# Patient Record
Sex: Male | Born: 1935 | Race: White | Hispanic: No | Marital: Married | Smoking: Former smoker
Health system: Southern US, Community
[De-identification: ages and names within clinical notes are randomized; demographics above are authoritative.]

## PROBLEM LIST (undated history)

## (undated) DIAGNOSIS — M545 Low back pain, unspecified: Secondary | ICD-10-CM

## (undated) DIAGNOSIS — M1990 Unspecified osteoarthritis, unspecified site: Secondary | ICD-10-CM

## (undated) DIAGNOSIS — E785 Hyperlipidemia, unspecified: Secondary | ICD-10-CM

## (undated) DIAGNOSIS — N200 Calculus of kidney: Secondary | ICD-10-CM

## (undated) DIAGNOSIS — G8929 Other chronic pain: Secondary | ICD-10-CM

## (undated) DIAGNOSIS — Z8719 Personal history of other diseases of the digestive system: Secondary | ICD-10-CM

## (undated) DIAGNOSIS — I8390 Asymptomatic varicose veins of unspecified lower extremity: Secondary | ICD-10-CM

## (undated) DIAGNOSIS — F0390 Unspecified dementia without behavioral disturbance: Secondary | ICD-10-CM

## (undated) DIAGNOSIS — Z9889 Other specified postprocedural states: Secondary | ICD-10-CM

## (undated) DIAGNOSIS — H269 Unspecified cataract: Secondary | ICD-10-CM

## (undated) DIAGNOSIS — R112 Nausea with vomiting, unspecified: Secondary | ICD-10-CM

## (undated) DIAGNOSIS — M109 Gout, unspecified: Secondary | ICD-10-CM

## (undated) DIAGNOSIS — I10 Essential (primary) hypertension: Secondary | ICD-10-CM

## (undated) DIAGNOSIS — I4891 Unspecified atrial fibrillation: Secondary | ICD-10-CM

## (undated) DIAGNOSIS — K219 Gastro-esophageal reflux disease without esophagitis: Secondary | ICD-10-CM

## (undated) HISTORY — DX: Low back pain, unspecified: M54.50

## (undated) HISTORY — DX: Hyperlipidemia, unspecified: E78.5

## (undated) HISTORY — PX: APPENDECTOMY: SHX54

## (undated) HISTORY — PX: TOE AMPUTATION: SHX809

## (undated) HISTORY — PX: CYSTOSCOPY W/ STONE MANIPULATION: SHX1427

## (undated) HISTORY — PX: EYE SURGERY: SHX253

## (undated) HISTORY — DX: Unspecified cataract: H26.9

## (undated) HISTORY — DX: Asymptomatic varicose veins of unspecified lower extremity: I83.90

## (undated) HISTORY — DX: Calculus of kidney: N20.0

## (undated) HISTORY — DX: Low back pain: M54.5

## (undated) HISTORY — DX: Essential (primary) hypertension: I10

## (undated) HISTORY — PX: TONSILLECTOMY AND ADENOIDECTOMY: SUR1326

## (undated) HISTORY — DX: Unspecified atrial fibrillation: I48.91

## (undated) HISTORY — PX: CHOLECYSTECTOMY: SHX55

## (undated) HISTORY — DX: Gout, unspecified: M10.9

---

## 1999-01-26 HISTORY — PX: CATARACT EXTRACTION W/ INTRAOCULAR LENS IMPLANT: SHX1309

## 1999-04-23 ENCOUNTER — Encounter (INDEPENDENT_AMBULATORY_CARE_PROVIDER_SITE_OTHER): Payer: Self-pay | Admitting: *Deleted

## 1999-04-23 ENCOUNTER — Ambulatory Visit (HOSPITAL_COMMUNITY): Admission: RE | Admit: 1999-04-23 | Discharge: 1999-04-23 | Payer: Self-pay

## 1999-04-23 ENCOUNTER — Inpatient Hospital Stay (HOSPITAL_COMMUNITY): Admission: EM | Admit: 1999-04-23 | Discharge: 1999-04-26 | Payer: Self-pay | Admitting: Emergency Medicine

## 1999-04-24 ENCOUNTER — Encounter: Payer: Self-pay | Admitting: Internal Medicine

## 2002-11-09 ENCOUNTER — Emergency Department (HOSPITAL_COMMUNITY): Admission: EM | Admit: 2002-11-09 | Discharge: 2002-11-09 | Payer: Self-pay | Admitting: Emergency Medicine

## 2004-07-11 ENCOUNTER — Ambulatory Visit: Payer: Self-pay | Admitting: Gastroenterology

## 2004-07-19 ENCOUNTER — Ambulatory Visit: Payer: Self-pay | Admitting: Gastroenterology

## 2004-10-04 ENCOUNTER — Ambulatory Visit (HOSPITAL_COMMUNITY): Admission: RE | Admit: 2004-10-04 | Discharge: 2004-10-04 | Payer: Self-pay | Admitting: Family Medicine

## 2006-10-06 ENCOUNTER — Ambulatory Visit: Payer: Self-pay | Admitting: Internal Medicine

## 2006-10-22 ENCOUNTER — Ambulatory Visit: Payer: Self-pay | Admitting: Internal Medicine

## 2010-06-17 ENCOUNTER — Encounter: Payer: Self-pay | Admitting: Family Medicine

## 2010-07-02 LAB — HEMOCCULT GUIAC POC 1CARD (OFFICE)

## 2010-08-25 ENCOUNTER — Encounter: Payer: Self-pay | Admitting: Family Medicine

## 2010-08-25 DIAGNOSIS — I8390 Asymptomatic varicose veins of unspecified lower extremity: Secondary | ICD-10-CM

## 2010-08-25 DIAGNOSIS — K449 Diaphragmatic hernia without obstruction or gangrene: Secondary | ICD-10-CM

## 2010-08-25 DIAGNOSIS — M545 Low back pain, unspecified: Secondary | ICD-10-CM

## 2010-08-25 DIAGNOSIS — N200 Calculus of kidney: Secondary | ICD-10-CM

## 2010-08-25 DIAGNOSIS — I10 Essential (primary) hypertension: Secondary | ICD-10-CM

## 2010-08-25 DIAGNOSIS — M109 Gout, unspecified: Secondary | ICD-10-CM

## 2010-08-25 DIAGNOSIS — E785 Hyperlipidemia, unspecified: Secondary | ICD-10-CM

## 2010-10-12 NOTE — Consult Note (Signed)
NAME:  Mark Wilkinson, Mark Wilkinson                            ACCOUNT NO.:  [id number]   MEDICAL RECORD NO.:  [id number]                   PATIENT TYPE:  EMS   LOCATION:  [facility]                                 FACILITY:  [facility]   PHYSICIAN:  Karol T. Lazarus Salines, M.D.              DATE OF BIRTH:  [birth year]   DATE OF CONSULTATION:  DATE OF DISCHARGE:  11/09/2002                                   CONSULTATION   CHIEF COMPLAINT:  Left epistaxis.   BRIEF HISTORY:  A 77 year old white male who had a trickle of blood last  evening, nothing today, and then this evening at dinner, had a brisk left  anterior nosebleed.  He bled approximately 1/2 cup in the doctor's office up  in [place].  Dr.  Charlesetta Shanks could not see an active bleeding site, and sent him our way for  further consideration.  The patient does have hypertension, but is well  controlled on medications.  He takes one aspirin daily, and one Celebrex  every other day.  He has no known bleeding tendencies.  No recent history of  nasal trauma, upper respiratory infection, sinus infection.  He has not been  blowing his nose excessively.  No trauma to the nose.  No past history of  surgery to the nose.  No history of polyps.   Dr. Charlesetta Shanks placed a gauze pack prior to his transfer.  He is not  actively bleeding at the time of his arrival.  He has never had nosebleed  before.  No other known bleeding tendencies including bruising or bleeding  when he brushes his teeth.   PHYSICAL EXAMINATION:  GENERAL:  This is a pleasant, older, middle-aged  white male, in no distress.  He has bulky gauze packing taped on to his  nose.  Mental status is sharp.  He hears well in conversational speech.  Voice is clear and respirations are unlabored.  He is not actively bleeding.  HEENT:  Head is atraumatic.  Ear canals are clear with aerated drums of  normal configuration.  Oral cavity is clear with healthy membranes, and  remaining teeth are in good repair.   Oropharynx shows no residual blood and  no active bleeding.  Nasal examination shows a clear musocal on the right  side.  On the left side, he has a very subtle granuloma on the anterior  septum, with minimal trauma.  This bleeds in a brisk arteriolar fashion.  No  other lesions are identified.  NECK:  Supple.  Without adenopathy.  NEUROLOGIC:  Cranial nerves II-XII are intact.   IMPRESSION:  Left anterior septal granuloma with epistaxis.   PLAN:  With informed consent, I anesthetized the anterior nose with 4%  topical cocaine solution, 4 cc total.  After several minutes, silver nitrate  cautery was performed thoroughly in the vicinity with good control and good  tolerance.  We will have  the patient hold off on aspirin for five days, and  same with activities. We will have him use  ointment in the nostrils twice daily, and saline spray frequently to keep  things moist so the scab will melt away.  I will check him in one month or  not at all if he is pleased with the results.   I discussed this with the patient and his family.  They understand and  agree.                                               Gloris Manchester. Lazarus Salines, M.D.    KTW/MEDQ  D:  11/09/2002  T:  11/09/2002  Job:  [id number]   cc:   Charlesetta Shanks  [address]  [place] [address]  Fax: [phone]

## 2010-10-12 NOTE — H&P (Signed)
[place]. [facility]  Patient:    Mark Wilkinson                            MRN: [id number] Adm. Date:  [id number] Attending:  Brandy Hale CC:         Osker Mason, M.D., [facility]             Wilhemina Bonito. Eda Keys., M.D. [facility]             Haywood M. Derrell Lolling, M.D. (2 copies)                         History and Physical  CHIEF COMPLAINT:  Abdominal pain, nausea, and jaundice.  HISTORY OF PRESENT ILLNESS:  This is a 77 year old white man who gives a history of insidious onset of epigastric pain, nausea, and vomiting about three weeks ago. He had a low-grade fever at that time but no diarrhea.  The pain and nausea subsided, but he remained anorexic until recently.  About four or five days ago, he developed recurrent epigastric pain, nausea, and retching, and postprandial nausea persisted. He developed fever to 101.6 and chills while at home.  His bowel movements became very light in color, and his urine became very dark in color.  He did not have ny diarrhea.  The pain eased off about 48 hours ago, and he feels fairly good today. He saw Dr. Heide Scales at [facility] last Friday.  He was given a prescription for Carafate.  Blood work was drawn which ultimately revealed a total bilirubin of 7.6 and elevated liver enzymes.  Hemoglobin 15.9 and a white count of 9300.  He had a gallbladder ultrasound at [facility] this morning which shows a thick walled gallbladder containing gallstones and a dilated common bile duct at 12 mm diameter.   He also underwent a hepatobiliary scan this morning at [facility] which shows nonvisualization of the gallbladder and a prominent common bile duct and a slow trickle of contrast into the small bowel. He as sent to the emergency room for my evaluation.  He is admitted for further evaluation and management.  PAST MEDICAL HISTORY:  He has hypertension and  has seen Dr. Bonnee Quin in the ast for treadmill evaluation.  He has gastroesophageal reflux disease without known  complication.  He had appendectomy several years ago, tonsillectomy several years ago.  He has a history of kidney stones, history of mild benign prostatic hypertrophy.  History of gout.  History of arthritis.  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  CURRENT MEDICATIONS: 1. Hydrochlorothiazide 25 mg q.d. 2. Allopurinol 300 mg q.d. 3. Atenolol 100 mg 1/2 tablet b.i.d. 4. Accupril 10 mg b.i.d. 5. Pravachol 20 mg q.d. 6. Celebrex 200 mg q.d. 7. Aspirin 325 mg q.d.  DRUG ALLERGIES:  PENICILLIN.  FAMILY HISTORY:  Mother died at age 83 of "old age."  Father died at age 82. He had a myocardial infarction and a stroke.  Two brothers, one died at age 76 of  myocardial infarction.  The other brother is living but has also had a heart attack.  Two sisters, one has hypertension.  SOCIAL HISTORY:  The patient lives in [place].  He is married and lives with his wife.  They have four children.  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  Tobacco: He quit smoking 40 years ago.  Alcohol use: Never.  REVIEW OF SYSTEMS:  All systems are reviewed and are negative except as described above.  PHYSICAL EXAMINATION:  GENERAL:  A relatively healthy-appearing, somewhat overweight, older white man n no apparent distress.  VITAL SIGNS:  Temperature 97.7, respirations 16, heart rate 61.  HEENT:  Trace scleral icterus.  Extraocular movements intact.  Nose, oropharynx, and external auditory canals without gross lesion.  NECK:  Supple, nontender.  No mass, no bruit, no thyromegaly.  LUNGS:  Clear to auscultation.  No CVA tenderness.  HEART:  Regular rate and rhythm with no murmur.  ABDOMEN:  Slightly obese, soft, and nontender.  Hypoactive bowel sounds.  No mass. A well-healed paramedian scar on the right side of the lower abdomen from previous appendectomy,  no hernia there.  GENITALIA:  No obvious hernia.  Penis, scrotum, and testes feel normal.  EXTREMITIES:  Free range of motion without deformity.  There is no peripheral edema.  The right second toe is surgically absent.  There is excellent radial, femoral, and dorsalis pedis pulses bilaterally.  NEUROLOGIC:  Grossly within normal limits.  ADMISSION DATA:  Total bilirubin 2.7, creatinine 1.8, BUN 45, potassium 3.9. WBC 9400, hemoglobin 15.1.  Amylase 145.  IMPRESSION: 1. Subacute cholecystitis with cholelithiasis. 2. Obstructive jaundice, suspect choledocholithiasis. 3. Prerenal azotemia. 4. Hypertension. 5. Gastroesophageal reflux disease. 6. Gout. 7. Status post appendectomy. 8. History of kidney stones.  PLAN:  The patient will be admitted for rehydration.  He will be placed on broad-spectrum antibiotics.  He will see Dr. Yancey Flemings who will evaluate him for preoperative ERCP to clear his common bowel duct.  We will tentatively schedule him for cholecystectomy on Wednesday, November 29.DD:  04/23/99 TD:  04/23/99 Job: [id number]

## 2010-10-12 NOTE — Procedures (Signed)
[place]. [facility]  Patient:    Mark Wilkinson                            MRN: [id number] Proc. Date: 04/24/99 Adm. Date:  [id number] Attending:  Brandy Hale CC:         Monica Becton, M.D.             Osker Mason, M.D.             Angelia Mould. Derrell Lolling, M.D.             Arturo Morton. Riley Kill, M.D. [facility]                           Procedure Report  PROCEDURE PERFORMED:  Endoscopic retrograde cholangiography with biliary sphincterotomy and common bile duct stone extraction.  ENDOSCOPIST:  Wilhemina Bonito. Eda Keys., M.D.  INDICATIONS:  Biliary obstruction secondary to choledocholithiasis.  HISTORY:  The patient is a 77 year old gentleman with recurrent problems with nausea, vomiting, abdominal pain and fever.  In addition, transient jaundice. Imaging studies have revealed cholelithiasis, thickened gallbladder wall, and dilated biliary tree to a maximal diameter of 12 mm.  He was admitted to the hospital and placed on antibiotics.  Symptoms have improved as well as laboratories.  He is for anticipated cholecystectomy tomorrow.  He is now for preopearative ERCP with possible sphincterotomy and stone extraction.  The nature of the procedure as well as the risks, benefits and alternatives were reviewed n detail.  He understood and agreed to proceed.  PHYSICAL EXAMINATION:  The patient is a well-appearing male in no acute distress. He is alert and oriented.  Vital signs are stable.  Lungs are clear.  Heart is regular.  Abdomen is soft with mild tenderness.  DESCRIPTION OF PROCEDURE:  After informed consent was obtained, the patient was  sedated with 100 mg of Demerol and 10 mg of Versed IV.  Cefotan 2 gm IV was administered preprocedurally.  Glucagon 1.5 mg IV was given as a duodenal relaxant. The sideviewing endoscope was then passed blindly into the esophagus.  The esophagus was not examined.  The stomach and duodenal bulb were normal.  The postbulbar  duodenum was redundant with several wide-mouthed diverticula noted. The major ampulla was identified and appeared normal.  The minor ampulla was not sought.  X-RAY FINDINGS: 1. Scout radiograph of the abdomen with the endoscope in position revealed no    relevant abnormalities. 2. The common bile duct was selectively and deeply cannulated.  Completely filling    of the biliary tree revealed mild dilation with a common bile duct diameter    of approximately 8 to 9 mm.  Cystic duct was patent and the gallbladder filled.    Two filling defects measuring 8 mm and 4 mm respectively were identified. These    were consistent with stones.  No attempt at pancreatogram was made.  THERAPY:  Hydrophilic guide wire was placed into the proximal biliary tree. Over the guide wire a biliary sphincterotomy was made with cutting in the 12 oclock orientation.  Sphincterotomy size was deemed moderate and limited due to the relationship of the ampulla and duodenal wall.  Subsequently, the cutting cannula was exchanged for a 12 mm balloon.  The balloon was pulled through and no stones extracted.  Post extraction occlusion cholangiogram yielded no residual filling  defects with good drainage.  The  patient tolerated the procedure well.  IMPRESSION: 1. Choledocholithiasis, status post ERC with sphincterotomy and common duct    stone extraction. 2. Cholelithiasis.  RECOMMENDATIONS: 1. Continue antibiotics. 2. Laparoscopic cholecystectomy anticipated tomorrow.DD:  04/24/99 TD:  04/25/99 Job: [id number]

## 2010-10-12 NOTE — Op Note (Signed)
[place]. [facility]  Patient:    Mark Wilkinson                            MRN: [id number] Proc. Date: 04/25/99 Adm. Date:  [id number] Attending:  Brandy Hale CC:         Osker Mason, M.D., [facility]             Wilhemina Bonito. Eda Keys., M.D. [facility]                           Operative Report  PREOPERATIVE DIAGNOSES: 1. Acute and chronic cholecystitis with cholelithiasis. 2. Choledocholithiasis, status post endoscopic retrograde cholangiopancreatography    with sphincterotomy and stone removal.  POSTOPERATIVE DIAGNOSES: 1. Acute and chronic cholecystitis with cholelithiasis. 2. Choledocholithiasis, status post endoscopic retrograde cholangiopancreatography    with sphincterotomy and stone removal.  PROCEDURE:  Laparoscopic cholecystectomy.  SURGEON:  Angelia Mould. Derrell Lolling, M.D.  ASSISTANT:  Lorne Skeens. H[place], M.D.  OPERATIVE INDICATIONS:  This is a 77 year old white man, who was admitted to the hospital on April 23, 1999 following intermittent episodes of biliary colic,  fever and jaundice.  Last Friday, as an outpatient, he had a total bilirubin of 7.6 and an ultrasound showed thickened gallbladder wall, gallstones and a dilated common bile duct.  He was admitted to the hospital on November 27, at that time, his bilirubin had fallen to 2.7, but he was still having some residual pain. He was placed on antibiotics.  He underwent ERCP by Dr. Yancey Flemings yesterday afternoon, sphincterotomy was performed and a couple of common bile duct stones  were removed.  The postprocedure cholangiogram was normal with good drainage and no filling defects.  He had no complications from that procedure.  He was brought o the operating room today for cholecystectomy.  OPERATIVE FINDINGS:  The gallbladder was acutely and chronically inflamed.  The  gallbladder was tense, thick-walled with extensive adhesions throughout  the gallbladder.  The liver appeared healthy.  The stomach and duodenum and small intestine appeared healthy.  There was some adhesions in the right paramedian area of the abdominal wall, some of which had to be taken down for visualization. These adhesions are presumably due to his previous right paramedian incision for appendicitis.  OPERATIVE TECHNIQUE:  Following the induction of general endotracheal anesthesia, the patients abdomen was prepped and draped in sterile fashion.  A transverse incision was made at the superior rim of the umbilicus.  The fascia was incised in the midline and the abdominal cavity entered under direct vision.  We had no adhesions right around the umbilicus.  We inserted a 10 mm Hasson trocar and secured this with a purse-string suture of 0 Vicryl.  Pneumoperitoneum was created. Video camera was inserted.  We noted there were fairly extensive adhesions just to the patients right and superior to this trocar.  We placed a 10 mm trocar in the subxiphoid region and then placed the camera in the subxiphoid port and put two  5 mm trocars in the right upper quadrant.  Under direct vision, we took down some of these adhesions, which greatly facilitated visualization through the umbilical port.  We reinserted the camera in the umbilical port.  Patient was positioned. We elevated the fundus of the gallbladder.  Ultimately, we had to decompress the gallbladder.  Extensive adhesions had to be taken down quite carefully, but  this lysis of adhesions was uneventful.  We identified the infundibulum of the gallbladder and then dissected out the cystic duct and cystic artery.  We were ery careful with our dissection to dissect out the cystic artery, secure it with metal clips and divided.  We were then very careful to dissect out almost the full length of the cystic duct and then once we could identify the region of the common bile duct and the junction of the  cystic duct with the gallbladder we secured the cystic duct with metal clips and divided it.  We then dissected the gallbladder from its bed with electrocautery and sharp and blunt dissection.  We placed the gallbladder in a specimen bag and removed it through the umbilical port.  The operative field was copiously irrigated with about 2000 cc of saline.  Hemostasis was excellent and achieved with electrocautery.  The clips on the cystic duct and cystic artery appeared quite secure.  There was no bile leak from the cystic duct or the gallbladder bed.  After completing irrigation, the trocars were removed under direct vision and there was no bleeding.  The pneumoperitoneum was released. The fascia of the umbilicus was closed with 0 Vicryl sutures.  The skin incisions were closed with subcuticular sutures of 4-0 Vicryl and Steri-Strips.  Clean bandages were placed and the patient taken to the recovery room in stable condition. Estimated blood loss was about 30-40 cc.  Complications none.  Sponge, needle and instrument counts were correct. DD:  04/25/99 TD:  04/26/99 Job: [id number]

## 2011-05-29 DIAGNOSIS — M1710 Unilateral primary osteoarthritis, unspecified knee: Secondary | ICD-10-CM | POA: Diagnosis not present

## 2011-06-04 DIAGNOSIS — H353290 Exudative age-related macular degeneration, unspecified eye, stage unspecified: Secondary | ICD-10-CM | POA: Diagnosis not present

## 2011-06-04 DIAGNOSIS — H35059 Retinal neovascularization, unspecified, unspecified eye: Secondary | ICD-10-CM | POA: Diagnosis not present

## 2011-06-07 DIAGNOSIS — M1710 Unilateral primary osteoarthritis, unspecified knee: Secondary | ICD-10-CM | POA: Diagnosis not present

## 2011-06-11 DIAGNOSIS — E785 Hyperlipidemia, unspecified: Secondary | ICD-10-CM | POA: Diagnosis not present

## 2011-06-11 DIAGNOSIS — E559 Vitamin D deficiency, unspecified: Secondary | ICD-10-CM | POA: Diagnosis not present

## 2011-06-11 DIAGNOSIS — I10 Essential (primary) hypertension: Secondary | ICD-10-CM | POA: Diagnosis not present

## 2011-06-12 DIAGNOSIS — M9981 Other biomechanical lesions of cervical region: Secondary | ICD-10-CM | POA: Diagnosis not present

## 2011-06-12 DIAGNOSIS — M999 Biomechanical lesion, unspecified: Secondary | ICD-10-CM | POA: Diagnosis not present

## 2011-06-12 DIAGNOSIS — M5137 Other intervertebral disc degeneration, lumbosacral region: Secondary | ICD-10-CM | POA: Diagnosis not present

## 2011-06-27 DIAGNOSIS — D696 Thrombocytopenia, unspecified: Secondary | ICD-10-CM | POA: Diagnosis not present

## 2011-07-10 DIAGNOSIS — H348192 Central retinal vein occlusion, unspecified eye, stable: Secondary | ICD-10-CM | POA: Diagnosis not present

## 2011-07-10 DIAGNOSIS — H35359 Cystoid macular degeneration, unspecified eye: Secondary | ICD-10-CM | POA: Diagnosis not present

## 2011-07-10 DIAGNOSIS — H35369 Drusen (degenerative) of macula, unspecified eye: Secondary | ICD-10-CM | POA: Diagnosis not present

## 2011-07-12 DIAGNOSIS — Z1212 Encounter for screening for malignant neoplasm of rectum: Secondary | ICD-10-CM | POA: Diagnosis not present

## 2011-07-24 DIAGNOSIS — M1710 Unilateral primary osteoarthritis, unspecified knee: Secondary | ICD-10-CM | POA: Diagnosis not present

## 2011-07-25 DIAGNOSIS — M9981 Other biomechanical lesions of cervical region: Secondary | ICD-10-CM | POA: Diagnosis not present

## 2011-07-25 DIAGNOSIS — M999 Biomechanical lesion, unspecified: Secondary | ICD-10-CM | POA: Diagnosis not present

## 2011-07-25 DIAGNOSIS — M5137 Other intervertebral disc degeneration, lumbosacral region: Secondary | ICD-10-CM | POA: Diagnosis not present

## 2011-08-14 DIAGNOSIS — H35359 Cystoid macular degeneration, unspecified eye: Secondary | ICD-10-CM | POA: Diagnosis not present

## 2011-08-14 DIAGNOSIS — H348192 Central retinal vein occlusion, unspecified eye, stable: Secondary | ICD-10-CM | POA: Diagnosis not present

## 2011-08-14 DIAGNOSIS — H35369 Drusen (degenerative) of macula, unspecified eye: Secondary | ICD-10-CM | POA: Diagnosis not present

## 2011-09-05 DIAGNOSIS — M999 Biomechanical lesion, unspecified: Secondary | ICD-10-CM | POA: Diagnosis not present

## 2011-09-05 DIAGNOSIS — M9981 Other biomechanical lesions of cervical region: Secondary | ICD-10-CM | POA: Diagnosis not present

## 2011-09-05 DIAGNOSIS — M5137 Other intervertebral disc degeneration, lumbosacral region: Secondary | ICD-10-CM | POA: Diagnosis not present

## 2011-09-18 DIAGNOSIS — H353290 Exudative age-related macular degeneration, unspecified eye, stage unspecified: Secondary | ICD-10-CM | POA: Diagnosis not present

## 2011-09-18 DIAGNOSIS — H35359 Cystoid macular degeneration, unspecified eye: Secondary | ICD-10-CM | POA: Diagnosis not present

## 2011-09-18 DIAGNOSIS — H35369 Drusen (degenerative) of macula, unspecified eye: Secondary | ICD-10-CM | POA: Diagnosis not present

## 2011-09-24 DIAGNOSIS — E785 Hyperlipidemia, unspecified: Secondary | ICD-10-CM | POA: Diagnosis not present

## 2011-09-24 DIAGNOSIS — I10 Essential (primary) hypertension: Secondary | ICD-10-CM | POA: Diagnosis not present

## 2011-09-24 DIAGNOSIS — Z125 Encounter for screening for malignant neoplasm of prostate: Secondary | ICD-10-CM | POA: Diagnosis not present

## 2011-09-24 DIAGNOSIS — E559 Vitamin D deficiency, unspecified: Secondary | ICD-10-CM | POA: Diagnosis not present

## 2011-10-03 DIAGNOSIS — E785 Hyperlipidemia, unspecified: Secondary | ICD-10-CM | POA: Diagnosis not present

## 2011-10-03 DIAGNOSIS — I10 Essential (primary) hypertension: Secondary | ICD-10-CM | POA: Diagnosis not present

## 2011-10-10 DIAGNOSIS — M9981 Other biomechanical lesions of cervical region: Secondary | ICD-10-CM | POA: Diagnosis not present

## 2011-10-10 DIAGNOSIS — M999 Biomechanical lesion, unspecified: Secondary | ICD-10-CM | POA: Diagnosis not present

## 2011-10-10 DIAGNOSIS — M5137 Other intervertebral disc degeneration, lumbosacral region: Secondary | ICD-10-CM | POA: Diagnosis not present

## 2011-10-23 DIAGNOSIS — H353190 Nonexudative age-related macular degeneration, unspecified eye, stage unspecified: Secondary | ICD-10-CM | POA: Diagnosis not present

## 2011-10-23 DIAGNOSIS — H35359 Cystoid macular degeneration, unspecified eye: Secondary | ICD-10-CM | POA: Diagnosis not present

## 2011-10-23 DIAGNOSIS — H353290 Exudative age-related macular degeneration, unspecified eye, stage unspecified: Secondary | ICD-10-CM | POA: Diagnosis not present

## 2011-11-12 DIAGNOSIS — H2510 Age-related nuclear cataract, unspecified eye: Secondary | ICD-10-CM | POA: Diagnosis not present

## 2011-11-12 DIAGNOSIS — Z961 Presence of intraocular lens: Secondary | ICD-10-CM | POA: Diagnosis not present

## 2011-11-12 DIAGNOSIS — H348192 Central retinal vein occlusion, unspecified eye, stable: Secondary | ICD-10-CM | POA: Diagnosis not present

## 2011-11-12 DIAGNOSIS — H40019 Open angle with borderline findings, low risk, unspecified eye: Secondary | ICD-10-CM | POA: Diagnosis not present

## 2011-11-14 ENCOUNTER — Other Ambulatory Visit: Payer: Self-pay | Admitting: Surgery

## 2011-11-14 DIAGNOSIS — L570 Actinic keratosis: Secondary | ICD-10-CM | POA: Diagnosis not present

## 2011-11-14 DIAGNOSIS — Z85828 Personal history of other malignant neoplasm of skin: Secondary | ICD-10-CM | POA: Diagnosis not present

## 2011-11-14 DIAGNOSIS — D485 Neoplasm of uncertain behavior of skin: Secondary | ICD-10-CM | POA: Diagnosis not present

## 2011-11-14 DIAGNOSIS — L988 Other specified disorders of the skin and subcutaneous tissue: Secondary | ICD-10-CM | POA: Diagnosis not present

## 2011-11-14 DIAGNOSIS — D235 Other benign neoplasm of skin of trunk: Secondary | ICD-10-CM | POA: Diagnosis not present

## 2011-11-14 DIAGNOSIS — L280 Lichen simplex chronicus: Secondary | ICD-10-CM | POA: Diagnosis not present

## 2011-11-21 DIAGNOSIS — M999 Biomechanical lesion, unspecified: Secondary | ICD-10-CM | POA: Diagnosis not present

## 2011-11-21 DIAGNOSIS — M5137 Other intervertebral disc degeneration, lumbosacral region: Secondary | ICD-10-CM | POA: Diagnosis not present

## 2011-11-21 DIAGNOSIS — M9981 Other biomechanical lesions of cervical region: Secondary | ICD-10-CM | POA: Diagnosis not present

## 2011-11-27 DIAGNOSIS — H348192 Central retinal vein occlusion, unspecified eye, stable: Secondary | ICD-10-CM | POA: Diagnosis not present

## 2011-11-27 DIAGNOSIS — H35359 Cystoid macular degeneration, unspecified eye: Secondary | ICD-10-CM | POA: Diagnosis not present

## 2011-12-19 DIAGNOSIS — M5137 Other intervertebral disc degeneration, lumbosacral region: Secondary | ICD-10-CM | POA: Diagnosis not present

## 2011-12-19 DIAGNOSIS — M999 Biomechanical lesion, unspecified: Secondary | ICD-10-CM | POA: Diagnosis not present

## 2011-12-19 DIAGNOSIS — M9981 Other biomechanical lesions of cervical region: Secondary | ICD-10-CM | POA: Diagnosis not present

## 2012-01-03 DIAGNOSIS — L570 Actinic keratosis: Secondary | ICD-10-CM | POA: Diagnosis not present

## 2012-01-03 DIAGNOSIS — D485 Neoplasm of uncertain behavior of skin: Secondary | ICD-10-CM | POA: Diagnosis not present

## 2012-01-06 DIAGNOSIS — H35359 Cystoid macular degeneration, unspecified eye: Secondary | ICD-10-CM | POA: Diagnosis not present

## 2012-01-06 DIAGNOSIS — H348192 Central retinal vein occlusion, unspecified eye, stable: Secondary | ICD-10-CM | POA: Diagnosis not present

## 2012-01-16 DIAGNOSIS — M5137 Other intervertebral disc degeneration, lumbosacral region: Secondary | ICD-10-CM | POA: Diagnosis not present

## 2012-01-16 DIAGNOSIS — M999 Biomechanical lesion, unspecified: Secondary | ICD-10-CM | POA: Diagnosis not present

## 2012-01-16 DIAGNOSIS — M9981 Other biomechanical lesions of cervical region: Secondary | ICD-10-CM | POA: Diagnosis not present

## 2012-01-22 DIAGNOSIS — E119 Type 2 diabetes mellitus without complications: Secondary | ICD-10-CM | POA: Diagnosis not present

## 2012-01-22 DIAGNOSIS — E559 Vitamin D deficiency, unspecified: Secondary | ICD-10-CM | POA: Diagnosis not present

## 2012-01-22 DIAGNOSIS — E785 Hyperlipidemia, unspecified: Secondary | ICD-10-CM | POA: Diagnosis not present

## 2012-01-22 DIAGNOSIS — I10 Essential (primary) hypertension: Secondary | ICD-10-CM | POA: Diagnosis not present

## 2012-02-05 DIAGNOSIS — I10 Essential (primary) hypertension: Secondary | ICD-10-CM | POA: Diagnosis not present

## 2012-02-05 DIAGNOSIS — E785 Hyperlipidemia, unspecified: Secondary | ICD-10-CM | POA: Diagnosis not present

## 2012-02-06 DIAGNOSIS — M1710 Unilateral primary osteoarthritis, unspecified knee: Secondary | ICD-10-CM | POA: Diagnosis not present

## 2012-02-11 DIAGNOSIS — H35359 Cystoid macular degeneration, unspecified eye: Secondary | ICD-10-CM | POA: Diagnosis not present

## 2012-02-11 DIAGNOSIS — H348192 Central retinal vein occlusion, unspecified eye, stable: Secondary | ICD-10-CM | POA: Diagnosis not present

## 2012-02-13 DIAGNOSIS — M1710 Unilateral primary osteoarthritis, unspecified knee: Secondary | ICD-10-CM | POA: Diagnosis not present

## 2012-02-20 DIAGNOSIS — M1710 Unilateral primary osteoarthritis, unspecified knee: Secondary | ICD-10-CM | POA: Diagnosis not present

## 2012-02-27 DIAGNOSIS — M5137 Other intervertebral disc degeneration, lumbosacral region: Secondary | ICD-10-CM | POA: Diagnosis not present

## 2012-02-27 DIAGNOSIS — M1710 Unilateral primary osteoarthritis, unspecified knee: Secondary | ICD-10-CM | POA: Diagnosis not present

## 2012-02-27 DIAGNOSIS — M9981 Other biomechanical lesions of cervical region: Secondary | ICD-10-CM | POA: Diagnosis not present

## 2012-02-27 DIAGNOSIS — M999 Biomechanical lesion, unspecified: Secondary | ICD-10-CM | POA: Diagnosis not present

## 2012-03-06 DIAGNOSIS — M1710 Unilateral primary osteoarthritis, unspecified knee: Secondary | ICD-10-CM | POA: Diagnosis not present

## 2012-03-23 DIAGNOSIS — Z23 Encounter for immunization: Secondary | ICD-10-CM | POA: Diagnosis not present

## 2012-03-24 DIAGNOSIS — H348192 Central retinal vein occlusion, unspecified eye, stable: Secondary | ICD-10-CM | POA: Diagnosis not present

## 2012-04-09 DIAGNOSIS — M999 Biomechanical lesion, unspecified: Secondary | ICD-10-CM | POA: Diagnosis not present

## 2012-04-09 DIAGNOSIS — M5137 Other intervertebral disc degeneration, lumbosacral region: Secondary | ICD-10-CM | POA: Diagnosis not present

## 2012-04-09 DIAGNOSIS — M9981 Other biomechanical lesions of cervical region: Secondary | ICD-10-CM | POA: Diagnosis not present

## 2012-04-30 DIAGNOSIS — E559 Vitamin D deficiency, unspecified: Secondary | ICD-10-CM | POA: Diagnosis not present

## 2012-04-30 DIAGNOSIS — E785 Hyperlipidemia, unspecified: Secondary | ICD-10-CM | POA: Diagnosis not present

## 2012-04-30 DIAGNOSIS — I10 Essential (primary) hypertension: Secondary | ICD-10-CM | POA: Diagnosis not present

## 2012-05-05 DIAGNOSIS — H348192 Central retinal vein occlusion, unspecified eye, stable: Secondary | ICD-10-CM | POA: Diagnosis not present

## 2012-05-05 DIAGNOSIS — H35359 Cystoid macular degeneration, unspecified eye: Secondary | ICD-10-CM | POA: Diagnosis not present

## 2012-05-14 DIAGNOSIS — I10 Essential (primary) hypertension: Secondary | ICD-10-CM | POA: Diagnosis not present

## 2012-05-14 DIAGNOSIS — E785 Hyperlipidemia, unspecified: Secondary | ICD-10-CM | POA: Diagnosis not present

## 2012-05-14 DIAGNOSIS — N400 Enlarged prostate without lower urinary tract symptoms: Secondary | ICD-10-CM | POA: Diagnosis not present

## 2012-05-14 DIAGNOSIS — R072 Precordial pain: Secondary | ICD-10-CM | POA: Diagnosis not present

## 2012-05-18 DIAGNOSIS — M999 Biomechanical lesion, unspecified: Secondary | ICD-10-CM | POA: Diagnosis not present

## 2012-05-18 DIAGNOSIS — M5137 Other intervertebral disc degeneration, lumbosacral region: Secondary | ICD-10-CM | POA: Diagnosis not present

## 2012-05-18 DIAGNOSIS — M9981 Other biomechanical lesions of cervical region: Secondary | ICD-10-CM | POA: Diagnosis not present

## 2012-06-16 DIAGNOSIS — H348192 Central retinal vein occlusion, unspecified eye, stable: Secondary | ICD-10-CM | POA: Diagnosis not present

## 2012-06-16 DIAGNOSIS — H35359 Cystoid macular degeneration, unspecified eye: Secondary | ICD-10-CM | POA: Diagnosis not present

## 2012-06-29 DIAGNOSIS — M5137 Other intervertebral disc degeneration, lumbosacral region: Secondary | ICD-10-CM | POA: Diagnosis not present

## 2012-06-29 DIAGNOSIS — M9981 Other biomechanical lesions of cervical region: Secondary | ICD-10-CM | POA: Diagnosis not present

## 2012-06-29 DIAGNOSIS — M999 Biomechanical lesion, unspecified: Secondary | ICD-10-CM | POA: Diagnosis not present

## 2012-07-24 ENCOUNTER — Emergency Department (HOSPITAL_COMMUNITY)
Admission: EM | Admit: 2012-07-24 | Discharge: 2012-07-24 | Disposition: A | Discharge status: Home / Self Care | Payer: Medicare Other | Attending: Emergency Medicine | Admitting: Emergency Medicine

## 2012-07-24 ENCOUNTER — Encounter (HOSPITAL_COMMUNITY): Payer: Self-pay

## 2012-07-24 ENCOUNTER — Emergency Department (HOSPITAL_COMMUNITY): Payer: Medicare Other

## 2012-07-24 DIAGNOSIS — R109 Unspecified abdominal pain: Secondary | ICD-10-CM | POA: Diagnosis not present

## 2012-07-24 DIAGNOSIS — Z8679 Personal history of other diseases of the circulatory system: Secondary | ICD-10-CM | POA: Diagnosis not present

## 2012-07-24 DIAGNOSIS — I10 Essential (primary) hypertension: Secondary | ICD-10-CM | POA: Diagnosis not present

## 2012-07-24 DIAGNOSIS — Z8719 Personal history of other diseases of the digestive system: Secondary | ICD-10-CM | POA: Insufficient documentation

## 2012-07-24 DIAGNOSIS — E785 Hyperlipidemia, unspecified: Secondary | ICD-10-CM | POA: Insufficient documentation

## 2012-07-24 DIAGNOSIS — Z79899 Other long term (current) drug therapy: Secondary | ICD-10-CM | POA: Diagnosis not present

## 2012-07-24 DIAGNOSIS — M109 Gout, unspecified: Secondary | ICD-10-CM | POA: Diagnosis not present

## 2012-07-24 DIAGNOSIS — N200 Calculus of kidney: Secondary | ICD-10-CM | POA: Diagnosis not present

## 2012-07-24 DIAGNOSIS — Z9889 Other specified postprocedural states: Secondary | ICD-10-CM | POA: Insufficient documentation

## 2012-07-24 DIAGNOSIS — Z9089 Acquired absence of other organs: Secondary | ICD-10-CM | POA: Insufficient documentation

## 2012-07-24 LAB — CBC WITH DIFFERENTIAL/PLATELET
HCT: 41.3 % (ref 39.0–52.0)
Hemoglobin: 14.5 g/dL (ref 13.0–17.0)
Lymphocytes Relative: 19 % (ref 12–46)
Lymphs Abs: 1.9 10*3/uL (ref 0.7–4.0)
Monocytes Relative: 6 % (ref 3–12)
Neutro Abs: 7.1 10*3/uL (ref 1.7–7.7)
Neutrophils Relative %: 74 % (ref 43–77)
RBC: 4.68 MIL/uL (ref 4.22–5.81)

## 2012-07-24 LAB — COMPREHENSIVE METABOLIC PANEL
Albumin: 3.3 g/dL — ABNORMAL LOW (ref 3.5–5.2)
Alkaline Phosphatase: 70 U/L (ref 39–117)
BUN: 10 mg/dL (ref 6–23)
CO2: 31 mEq/L (ref 19–32)
Chloride: 105 mEq/L (ref 96–112)
GFR calc non Af Amer: 78 mL/min — ABNORMAL LOW (ref >90)
Glucose, Bld: 107 mg/dL — ABNORMAL HIGH (ref 70–99)
Potassium: 3.6 mEq/L (ref 3.5–5.1)
Total Bilirubin: 0.5 mg/dL (ref 0.3–1.2)

## 2012-07-24 LAB — URINALYSIS, ROUTINE W REFLEX MICROSCOPIC
Glucose, UA: NEGATIVE mg/dL
Ketones, ur: NEGATIVE mg/dL
Leukocytes, UA: NEGATIVE
Protein, ur: NEGATIVE mg/dL

## 2012-07-24 NOTE — ED Notes (Signed)
Pt presents with sudden onset of LLQ abdominal pain.  Pt went to see MD at [facility], was found to have hematuria and EKG change since !07/16/2011.  Pt denies any chest discomfort, shortness of breath, reports 1 episode of nausea this morning.  81mg  x 4 of ASA and NTG given at MD office.  Pt denies any pain at present.

## 2012-07-24 NOTE — ED Provider Notes (Addendum)
History     CSN: [id number]  Arrival date & time 07/24/12  1141   First MD Initiated Contact with Patient 07/24/12 1155      No chief complaint on file.   (Consider location/radiation/quality/duration/timing/severity/associated sxs/prior treatment) HPI Comments: Pt states pain was 9/10 and sharp and now pain has resolved.  Patient is a 79 y.o. male presenting with flank pain. The history is provided by the patient.  Flank Pain This is a recurrent problem. The current episode started 3 to 5 hours ago. The problem occurs constantly. The problem has been resolved. Associated symptoms include abdominal pain. Associated symptoms comments: Wife states that this am while he was still in bed he called her in due to pain in the LLQ abd and left flank.  Then went to the PCP office and by the time he saw the doctor his pain had resolved.  However they did an EKG and felt that it was different and had him transferred here.. Nothing aggravates the symptoms. Nothing relieves the symptoms. He has tried nothing for the symptoms. The treatment provided significant relief.    Past Medical History  Diagnosis Date  . Essential hypertension, benign   . Other and unspecified hyperlipidemia   . Diaphragmatic hernia without mention of obstruction or gangrene   . Gout, unspecified   . Calculus of kidney   . [place], illinios [id number]   . Asymptomatic varicose veins   . Lumbago     Past Surgical History  Procedure Laterality Date  . Appendectomy    . Tonsilectomy, adenoidectomy, bilateral myringotomy and tubes    . Cholecystectomy      History reviewed. No pertinent family history.  History  Substance Use Topics  . Smoking status: Never Smoker   . Smokeless tobacco: Not on file  . Alcohol Use: No      Review of Systems  Gastrointestinal: Positive for abdominal pain.  Genitourinary: Positive for flank pain.  All other systems reviewed and are negative.    Allergies  Penicillins  Home  Medications   Current Outpatient Rx  Name  Route  Sig  Dispense  Refill  . allopurinol (ZYLOPRIM) 300 MG tablet   Oral   Take 300 mg by mouth daily.           Marland Kitchen atenolol (TENORMIN) 50 MG tablet   Oral   Take 25-50 mg by mouth 2 (two) times daily. 25 mg every morning and 50 mg every evening         . atorvastatin (LIPITOR) 40 MG tablet   Oral   Take 20 mg by mouth every evening.          . B Complex-C (SUPER B COMPLEX PO)   Oral   Take 1 tablet by mouth daily.         . Cholecalciferol (VITAMIN D3) 1000 UNITS CAPS   Oral   Take 3 capsules by mouth daily.          Marland Kitchen esomeprazole (NEXIUM) 40 MG capsule   Oral   Take 40 mg by mouth daily before breakfast.           . GLUCOSAMINE PO   Oral   Take 1 tablet by mouth 2 (two) times daily.         . meloxicam (MOBIC) 7.5 MG tablet   Oral   Take 7.5 mg by mouth daily as needed for pain.          . Multiple Vitamin (MULTIVITAMIN WITH MINERALS)  TABS   Oral   Take 1 tablet by mouth daily.         . niacin (NIASPAN) 500 MG CR tablet   Oral   Take 1,000 mg by mouth at bedtime.          . Omega-3 Fatty Acids (FISH OIL) 1000 MG CAPS   Oral   Take 1 capsule by mouth 2 (two) times daily.          . quinapril (ACCUPRIL) 10 MG tablet   Oral   Take 10 mg by mouth daily.           . timolol (BETIMOL) 0.5 % ophthalmic solution   Left Eye   Place 1 drop into the left eye 2 (two) times daily.           BP 123/68  Pulse 56  Temp(Src) 98.2 F (36.8 C) (Oral)  Resp 18  SpO2 98%  Physical Exam  Nursing note and vitals reviewed. Constitutional: He is oriented to person, place, and time. He appears well-developed and well-nourished. No distress.  HENT:  Head: Normocephalic and atraumatic.  Mouth/Throat: Oropharynx is clear and moist.  Eyes: Conjunctivae and EOM are normal. Pupils are equal, round, and reactive to light.  Neck: Normal range of motion. Neck supple.  Cardiovascular: Normal rate, regular  rhythm and intact distal pulses.   No murmur heard. Pulmonary/Chest: Effort normal and breath sounds normal. No respiratory distress. He has no wheezes. He has no rales.  Abdominal: Soft. Normal appearance. He exhibits no distension. There is no tenderness. There is no rebound, no guarding and no CVA tenderness.  Musculoskeletal: Normal range of motion. He exhibits no edema and no tenderness.  Neurological: He is alert and oriented to person, place, and time.  Skin: Skin is warm and dry. No rash noted. No erythema.  Psychiatric: He has a normal mood and affect. His behavior is normal.    ED Course  Procedures (including critical care time)  Labs Reviewed  CBC WITH DIFFERENTIAL - Abnormal; Notable for the following:    Platelets 110 (*)    All other components within normal limits  COMPREHENSIVE METABOLIC PANEL - Abnormal; Notable for the following:    Glucose, Bld 107 (*)    Albumin 3.3 (*)    GFR calc non Af Amer 78 (*)    All other components within normal limits  URINALYSIS, ROUTINE W REFLEX MICROSCOPIC   Dg Chest 2 View  07/24/2012  *RADIOLOGY REPORT*  Clinical Data: Abdominal pain  CHEST - 2 VIEW  Comparison: None.  Findings: Eventration of the right hemidiaphragm associated right basilar atelectasis.  The lungs are otherwise essentially clear. No pleural effusion or pneumothorax.  The heart is top normal in size.  Degenerative changes of the visualized thoracolumbar spine.  IMPRESSION: No evidence of acute cardiopulmonary disease.   Original Report Authenticated By: Charline Bills, M.D.      Date: 07/24/2012  Rate: 55  Rhythm: sinus bradycardia with PVC  QRS Axis: normal  Intervals: normal  ST/T Wave abnormalities: normal  Conduction Disutrbances:none  Narrative Interpretation:   Old EKG Reviewed: unchanged   1. Kidney stone       MDM   Patient was sent here by his physician at [facility] due to possible EKG changes. Family states that he  woke up this morning with severe pain in his left lower quadrant with some nausea and no vomiting. The pain resolved once he got to the doctor's office.  They did an EKG there and thought that it looked different. On comparison of his prior EKGs I do not note any changes. Repeat EKG shows no acute changes.  Patient denies any chest pain or shortness of breath today. He has no known cardiac history other than hypertension and hyperlipidemia. He has a known history of kidney stones.  Urine at physician's office was positive for blood here it has cleared. Labs including troponin are negative.  Low suspicion for cardiac etiology at this time. Patient is asymptomatic he states he feels well and wants to return home. Discussed with family and feel he is appropriate for discharge        Gwyneth Sprout, MD 07/24/12 1344  Gwyneth Sprout, MD 07/24/12 1352

## 2012-07-24 NOTE — ED Notes (Signed)
i-stat troponin results:   0.00  

## 2012-07-24 NOTE — ED Notes (Signed)
Pt transported to and from radiology on stretcher with tech, tolerated well.  

## 2012-07-27 DIAGNOSIS — N23 Unspecified renal colic: Secondary | ICD-10-CM | POA: Diagnosis not present

## 2012-07-29 DIAGNOSIS — N138 Other obstructive and reflux uropathy: Secondary | ICD-10-CM | POA: Diagnosis not present

## 2012-07-29 DIAGNOSIS — N281 Cyst of kidney, acquired: Secondary | ICD-10-CM | POA: Diagnosis not present

## 2012-07-29 DIAGNOSIS — N200 Calculus of kidney: Secondary | ICD-10-CM | POA: Diagnosis not present

## 2012-07-29 DIAGNOSIS — D4959 Neoplasm of unspecified behavior of other genitourinary organ: Secondary | ICD-10-CM | POA: Diagnosis not present

## 2012-08-05 DIAGNOSIS — H348192 Central retinal vein occlusion, unspecified eye, stable: Secondary | ICD-10-CM | POA: Diagnosis not present

## 2012-08-05 DIAGNOSIS — H35359 Cystoid macular degeneration, unspecified eye: Secondary | ICD-10-CM | POA: Diagnosis not present

## 2012-08-12 ENCOUNTER — Other Ambulatory Visit (INDEPENDENT_AMBULATORY_CARE_PROVIDER_SITE_OTHER): Payer: Medicare Other

## 2012-08-12 ENCOUNTER — Other Ambulatory Visit: Payer: Self-pay | Admitting: Urology

## 2012-08-12 DIAGNOSIS — E559 Vitamin D deficiency, unspecified: Secondary | ICD-10-CM | POA: Diagnosis not present

## 2012-08-12 DIAGNOSIS — I10 Essential (primary) hypertension: Secondary | ICD-10-CM | POA: Diagnosis not present

## 2012-08-12 LAB — POCT CBC
Hemoglobin: 14.4 g/dL (ref 14.1–18.1)
Lymph, poc: 2.9 (ref 0.6–3.4)
MCHC: 32.5 g/dL (ref 31.8–35.4)
MPV: 7.5 fL (ref 0–99.8)
POC Granulocyte: 4.5 (ref 2–6.9)
POC LYMPH PERCENT: 37.1 %L (ref 10–50)
RDW, POC: 13.9 %

## 2012-08-12 LAB — HEPATIC FUNCTION PANEL
Bilirubin, Direct: 0.1 mg/dL (ref 0.0–0.3)
Total Bilirubin: 0.4 mg/dL (ref 0.3–1.2)

## 2012-08-12 LAB — BASIC METABOLIC PANEL
Calcium: 9.8 mg/dL (ref 8.4–10.5)
Sodium: 143 mEq/L (ref 135–145)

## 2012-08-12 NOTE — Progress Notes (Signed)
Patient here for labs only. 

## 2012-08-12 NOTE — Anesthesia Preprocedure Evaluation (Addendum)
Anesthesia Evaluation  Patient identified by MRN, date of birth, ID band Patient awake    Reviewed: Allergy & Precautions, H&P , NPO status , Patient's Chart, lab work & pertinent test results  Airway Mallampati: II TM Distance: >3 FB Neck ROM: Full    Dental no notable dental hx.    Pulmonary neg pulmonary ROS,  breath sounds clear to auscultation  Pulmonary exam normal       Cardiovascular Exercise Tolerance: Good hypertension, Pt. on medications and Pt. on home beta blockers negative cardio ROS  Rhythm:Regular Rate:Normal  Low platelets.  ECG 07-24-12 reviewed. SB with multiform PVCs  One reading of ECG says A. Fib, but I see some p waves.  Dr. Vernon Prey in [place]   Neuro/Psych  Neuromuscular disease negative psych ROS   GI/Hepatic Neg liver ROS, hiatal hernia,   Endo/Other  negative endocrine ROS  Renal/GU Renal diseaseLeft ureter stone  negative genitourinary   Musculoskeletal   Abdominal   Peds negative pediatric ROS (+)  Hematology negative hematology ROS (+)   Anesthesia Other Findings   Reproductive/Obstetrics negative OB ROS                          Anesthesia Physical Anesthesia Plan  ASA: II  Anesthesia Plan: General   Post-op Pain Management:    Induction: Intravenous  Airway Management Planned: LMA  Additional Equipment:   Intra-op Plan:   Post-operative Plan: Extubation in OR  Informed Consent: I have reviewed the patients History and Physical, chart, labs and discussed the procedure including the risks, benefits and alternatives for the proposed anesthesia with the patient or authorized representative who has indicated his/her understanding and acceptance.   Dental advisory given  Plan Discussed with: CRNA  Anesthesia Plan Comments:         Anesthesia Quick Evaluation

## 2012-08-13 ENCOUNTER — Encounter (HOSPITAL_BASED_OUTPATIENT_CLINIC_OR_DEPARTMENT_OTHER): Payer: Self-pay | Admitting: *Deleted

## 2012-08-13 ENCOUNTER — Ambulatory Visit (HOSPITAL_BASED_OUTPATIENT_CLINIC_OR_DEPARTMENT_OTHER): Payer: Medicare Other | Admitting: Anesthesiology

## 2012-08-13 ENCOUNTER — Ambulatory Visit (HOSPITAL_BASED_OUTPATIENT_CLINIC_OR_DEPARTMENT_OTHER)
Admission: RE | Admit: 2012-08-13 | Discharge: 2012-08-13 | Disposition: A | Discharge status: Home / Self Care | Payer: Medicare Other | Source: Ambulatory Visit | Attending: Urology | Admitting: Urology

## 2012-08-13 ENCOUNTER — Encounter (HOSPITAL_BASED_OUTPATIENT_CLINIC_OR_DEPARTMENT_OTHER)
Admission: RE | Disposition: A | Discharge status: Home / Self Care | Payer: Self-pay | Source: Ambulatory Visit | Attending: Urology

## 2012-08-13 ENCOUNTER — Encounter (HOSPITAL_BASED_OUTPATIENT_CLINIC_OR_DEPARTMENT_OTHER): Payer: Self-pay | Admitting: Anesthesiology

## 2012-08-13 DIAGNOSIS — I10 Essential (primary) hypertension: Secondary | ICD-10-CM | POA: Insufficient documentation

## 2012-08-13 DIAGNOSIS — N201 Calculus of ureter: Secondary | ICD-10-CM | POA: Diagnosis not present

## 2012-08-13 DIAGNOSIS — R351 Nocturia: Secondary | ICD-10-CM | POA: Insufficient documentation

## 2012-08-13 DIAGNOSIS — M109 Gout, unspecified: Secondary | ICD-10-CM | POA: Insufficient documentation

## 2012-08-13 DIAGNOSIS — E785 Hyperlipidemia, unspecified: Secondary | ICD-10-CM | POA: Insufficient documentation

## 2012-08-13 DIAGNOSIS — Z79899 Other long term (current) drug therapy: Secondary | ICD-10-CM | POA: Insufficient documentation

## 2012-08-13 DIAGNOSIS — R109 Unspecified abdominal pain: Secondary | ICD-10-CM | POA: Insufficient documentation

## 2012-08-13 DIAGNOSIS — N289 Disorder of kidney and ureter, unspecified: Secondary | ICD-10-CM | POA: Insufficient documentation

## 2012-08-13 HISTORY — PX: CYSTOSCOPY WITH RETROGRADE PYELOGRAM, URETEROSCOPY AND STENT PLACEMENT: SHX5789

## 2012-08-13 LAB — VITAMIN D 25 HYDROXY (VIT D DEFICIENCY, FRACTURES): Vit D, 25-Hydroxy: 56 ng/mL (ref 30–89)

## 2012-08-13 LAB — POCT I-STAT 4, (NA,K, GLUC, HGB,HCT)
HCT: 40 % (ref 39.0–52.0)
Hemoglobin: 13.6 g/dL (ref 13.0–17.0)
Potassium: 4 mEq/L (ref 3.5–5.1)
Sodium: 145 mEq/L (ref 135–145)

## 2012-08-13 SURGERY — CYSTOURETEROSCOPY, WITH RETROGRADE PYELOGRAM AND STENT INSERTION
Anesthesia: General | Site: Ureter | Laterality: Left

## 2012-08-13 MED ORDER — SENNA-DOCUSATE SODIUM 8.6-50 MG PO TABS: 1.0000 | ORAL_TABLET | Freq: Two times a day (BID) | ORAL | Status: DC

## 2012-08-13 MED ORDER — BELLADONNA ALKALOIDS-OPIUM 16.2-60 MG RE SUPP
1.0000 | Freq: Every day | RECTAL | Status: AC
  Administered 2012-08-13: 1 via RECTAL
  Filled 2012-08-13: qty 1

## 2012-08-13 MED ORDER — CIPROFLOXACIN IN D5W 400 MG/200ML IV SOLN
400.0000 mg | INTRAVENOUS | Status: AC
  Administered 2012-08-13: 400 mg via INTRAVENOUS
  Filled 2012-08-13: qty 200

## 2012-08-13 MED ORDER — LIDOCAINE HCL (CARDIAC) 20 MG/ML IV SOLN
INTRAVENOUS | Status: DC | PRN
  Administered 2012-08-13: 50 mg via INTRAVENOUS

## 2012-08-13 MED ORDER — PROPOFOL 10 MG/ML IV BOLUS
INTRAVENOUS | Status: DC | PRN
  Administered 2012-08-13: 200 mg via INTRAVENOUS
  Administered 2012-08-13: 20 mg via INTRAVENOUS

## 2012-08-13 MED ORDER — DEXAMETHASONE SODIUM PHOSPHATE 4 MG/ML IJ SOLN
INTRAMUSCULAR | Status: DC | PRN
  Administered 2012-08-13: 8 mg via INTRAVENOUS

## 2012-08-13 MED ORDER — TRAMADOL-ACETAMINOPHEN 37.5-325 MG PO TABS: 1.0000 | ORAL_TABLET | Freq: Four times a day (QID) | ORAL | Status: DC | PRN

## 2012-08-13 MED ORDER — LACTATED RINGERS IV SOLN
INTRAVENOUS | Status: DC
  Administered 2012-08-13 (×3): via INTRAVENOUS
  Filled 2012-08-13: qty 1000

## 2012-08-13 MED ORDER — SODIUM CHLORIDE 0.9 % IR SOLN
Status: DC | PRN
  Administered 2012-08-13: 6000 mL

## 2012-08-13 MED ORDER — FENTANYL CITRATE 0.05 MG/ML IJ SOLN
INTRAMUSCULAR | Status: DC | PRN
  Administered 2012-08-13 (×3): 25 ug via INTRAVENOUS

## 2012-08-13 MED ORDER — FENTANYL CITRATE 0.05 MG/ML IJ SOLN
25.0000 ug | INTRAMUSCULAR | Status: DC | PRN
  Administered 2012-08-13 (×2): 25 ug via INTRAVENOUS
  Filled 2012-08-13: qty 1

## 2012-08-13 MED ORDER — IOHEXOL 300 MG/ML  SOLN
INTRAMUSCULAR | Status: DC | PRN
  Administered 2012-08-13: 12 mL

## 2012-08-13 MED ORDER — CIPROFLOXACIN HCL 250 MG PO TABS: 250.0000 mg | ORAL_TABLET | Freq: Two times a day (BID) | ORAL | Status: DC

## 2012-08-13 MED ORDER — TRAMADOL-ACETAMINOPHEN 37.5-325 MG PO TABS
1.0000 | ORAL_TABLET | Freq: Four times a day (QID) | ORAL | Status: DC | PRN
  Administered 2012-08-13: 1 via ORAL
  Filled 2012-08-13: qty 1

## 2012-08-13 SURGICAL SUPPLY — 23 items
BAG URO CATCHER STRL LF (DRAPE) ×2 IMPLANT
BASKET LASER NITINOL 1.9FR (BASKET) ×2 IMPLANT
BASKET ZERO TIP NITINOL 2.4FR (BASKET) IMPLANT
BSKT STON RTRVL 120 1.9FR (BASKET) ×1
BSKT STON RTRVL ZERO TP 2.4FR (BASKET)
CANISTER SUCT LVC 12 LTR MEDI- (MISCELLANEOUS) ×1 IMPLANT
CATH FOLEY 2WAY  3CC  8FR (CATHETERS)
CATH FOLEY 2WAY 3CC 8FR (CATHETERS) IMPLANT
CATH INTERMIT  6FR 70CM (CATHETERS) ×1 IMPLANT
CLOTH BEACON ORANGE TIMEOUT ST (SAFETY) ×2 IMPLANT
DRAPE CAMERA CLOSED 9X96 (DRAPES) ×2 IMPLANT
GLOVE BIO SURGEON STRL SZ7 (GLOVE) ×2 IMPLANT
GLOVE BIO SURGEON STRL SZ7.5 (GLOVE) ×2 IMPLANT
GOWN PREVENTION PLUS LG XLONG (DISPOSABLE) ×4 IMPLANT
GOWN STRL REIN XL XLG (GOWN DISPOSABLE) ×1 IMPLANT
GUIDEWIRE ANG ZIPWIRE 038X150 (WIRE) ×2 IMPLANT
GUIDEWIRE STR DUAL SENSOR (WIRE) ×1 IMPLANT
IV NS IRRIG 3000ML ARTHROMATIC (IV SOLUTION) ×3 IMPLANT
LASER FIBER DISP (UROLOGICAL SUPPLIES) ×1 IMPLANT
PACK CYSTOSCOPY (CUSTOM PROCEDURE TRAY) ×2 IMPLANT
STENT URET 6FRX26 CONTOUR (STENTS) ×1 IMPLANT
SYRINGE 10CC LL (SYRINGE) ×2 IMPLANT
TUBE FEEDING 8FR 16IN STR KANG (MISCELLANEOUS) ×1 IMPLANT

## 2012-08-13 NOTE — Anesthesia Procedure Notes (Signed)
Procedure Name: LMA Insertion Date/Time: 08/13/2012 7:31 AM Performed by: Fran Lowes Pre-anesthesia Checklist: Patient identified, Emergency Drugs available, Suction available and Patient being monitored Patient Re-evaluated:Patient Re-evaluated prior to inductionOxygen Delivery Method: Circle System Utilized Preoxygenation: Pre-oxygenation with 100% oxygen Intubation Type: IV induction Ventilation: Mask ventilation without difficulty LMA: LMA inserted LMA Size: 4.0 Number of attempts: 1 Airway Equipment and Method: bite block Placement Confirmation: positive ETCO2 Tube secured with: Tape Dental Injury: Teeth and Oropharynx as per pre-operative assessment

## 2012-08-13 NOTE — H&P (Signed)
Mark Wilkinson is an 79 y.o. male.    Chief Complaint: Pre-Op Left Ureteroscopic Stone Manipulation  HPI:   1 - Flank Pain / Ureteral Stone - Pt with several weeks of on/off flank pain. Seen at [facility] and felt likely renal colic and placed on medical expulsive therapy with alpha blockers / pain + nausea meds. No fevers. Recent Cr <1. CT 07/2012 confirms left UVJ 6mm stone, no sig aditional stones.  Prior Stone History: 2000 - ureteroscopy  pre 2000 - 3 episodes spontaneously passed stone  2 - Lower Urinary Tract Symptoms - PT with mooderate baseline obstructive and irritative symtoms, most bothored by noctura x 2-3, not on prior therapy. DRE 07/2012 80+gm.  3 - Left Renal Mass - Pt wih indeternimate area at left upper pole incidetnal on CT stone study, unchanged <2cm lesion since 2008.   PMH sig for Gout, DJD, HTN, TNA, Appy. 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  Today Mark Wilkinson is seen to proceed with left URS. Denies interval stone passage or fevers. Most recent UCX negative.   Past Medical History  Diagnosis Date  . Essential hypertension, benign   . Other and unspecified hyperlipidemia   . Diaphragmatic hernia without mention of obstruction or gangrene   . Gout, unspecified   . Calculus of kidney   . [place], illinios [id number]   . Asymptomatic varicose veins   . Lumbago     Past Surgical History  Procedure Laterality Date  . Appendectomy    . Tonsilectomy, adenoidectomy, bilateral myringotomy and tubes    . Cholecystectomy      No family history on file. Social History:  reports that he has never smoked. He does not have any smokeless tobacco history on file. He reports that he does not drink alcohol or use illicit drugs.  Allergies:  Allergies  Allergen Reactions  . Penicillins Other (See Comments)    Unknown     Medications Prior to Admission  Medication Sig Dispense Refill  . allopurinol (ZYLOPRIM) 300 MG tablet Take 300 mg by mouth daily.         Marland Kitchen atenolol (TENORMIN) 50 MG tablet Take 25-50 mg by mouth 2 (two) times daily. 25 mg every morning and 50 mg every evening      . atorvastatin (LIPITOR) 40 MG tablet Take 20 mg by mouth every evening.       . B Complex-C (SUPER B COMPLEX PO) Take 1 tablet by mouth daily.      . Cholecalciferol (VITAMIN D3) 1000 UNITS CAPS Take 3 capsules by mouth daily.       Marland Kitchen esomeprazole (NEXIUM) 40 MG capsule Take 40 mg by mouth daily before breakfast.        . GLUCOSAMINE PO Take 1 tablet by mouth 2 (two) times daily.      . meloxicam (MOBIC) 7.5 MG tablet Take 7.5 mg by mouth daily as needed for pain.       . Multiple Vitamin (MULTIVITAMIN WITH MINERALS) TABS Take 1 tablet by mouth daily.      . niacin (NIASPAN) 500 MG CR tablet Take 1,000 mg by mouth at bedtime.       . Omega-3 Fatty Acids (FISH OIL) 1000 MG CAPS Take 1 capsule by mouth 2 (two) times daily.       . quinapril (ACCUPRIL) 10 MG tablet Take 10 mg by mouth daily.        . timolol (BETIMOL) 0.5 % ophthalmic solution Place  1 drop into the left eye 2 (two) times daily.        Results for orders placed in visit on 08/12/12 (from the past 48 hour(s))  BASIC METABOLIC PANEL     Status: Abnormal   Collection Time    08/12/12 10:33 AM      Result Value Range   Sodium 143  135 - 145 mEq/L   Potassium 4.8  3.5 - 5.3 mEq/L   Chloride 106  96 - 112 mEq/L   CO2 30  19 - 32 mEq/L   Glucose, Bld 115 (*) 70 - 99 mg/dL   BUN 10  6 - 23 mg/dL   Creat 1.61  0.96 - 0.45 mg/dL   Calcium 9.8  8.4 - 40.9 mg/dL  NMR, LIPOPROFILE     Status: None   Collection Time    08/12/12 10:33 AM      Result Value Range   Sent to [facility]      VITAMIN D 25 HYDROXY     Status: None   Collection Time    08/12/12 10:33 AM      Result Value Range   Vit D, 25-Hydroxy 56  30 - 89 ng/mL   Comment: This assay accurately quantifies Vitamin D, which is the sum of the     25-Hydroxy forms of Vitamin D2 and D3.  Studies have shown that the     optimum concentration of  25-Hydroxy Vitamin D is 30 ng/mL or higher.      Concentrations of Vitamin D between 20 and 29 ng/mL are considered to     be insufficient and concentrations less than 20 ng/mL are considered     to be deficient for Vitamin D.  HEPATIC FUNCTION PANEL     Status: None   Collection Time    08/12/12 10:33 AM      Result Value Range   Total Bilirubin 0.4  0.3 - 1.2 mg/dL   Bilirubin, Direct 0.1  0.0 - 0.3 mg/dL   Indirect Bilirubin 0.3  0.0 - 0.9 mg/dL   Alkaline Phosphatase 68  39 - 117 U/L   AST 24  0 - 37 U/L   ALT 16  0 - 53 U/L   Total Protein 6.8  6.0 - 8.3 g/dL   Albumin 4.0  3.5 - 5.2 g/dL  POCT CBC     Status: None   Collection Time    08/12/12 10:45 AM      Result Value Range   WBC 7.7  4.6 - 10.2 K/uL   Lymph, poc 2.9  0.6 - 3.4   POC LYMPH PERCENT 37.1  10 - 50 %L   MID (cbc)    0 - 0.9   POC MID %    0 - 12 %M   POC Granulocyte 4.5  2 - 6.9   Granulocyte percent 58.1  37 - 80 %G   RBC 4.9  4.69 - 6.13 M/uL   Hemoglobin 14.4  14.1 - 18.1 g/dL   HCT, POC 81.1  91.4 - 53.7 %   MCV 91.1  80 - 97 fL   MCH, POC 29.6  27 - 31.2 pg   MCHC 32.5  31.8 - 35.4 g/dL   RDW, POC 78.2     Platelet Count, POC 176  142 - 424 K/uL   MPV 7.5  0 - 99.8 fL   No results found.  Review of Systems  Constitutional: Negative.  Negative for fever and chills.  HENT: Negative.   Eyes: Negative.   Respiratory: Negative.   Cardiovascular: Negative.   Gastrointestinal: Negative.   Genitourinary: Negative.   Musculoskeletal: Negative.   Skin: Negative.   Neurological: Negative.   Endo/Heme/Allergies: Negative.   Psychiatric/Behavioral: Negative.     There were no vitals taken for this visit. Physical Exam  Constitutional: He is oriented to person, place, and time. He appears well-developed and well-nourished.  HENT:  Head: Normocephalic and atraumatic.  Eyes: EOM are normal. Pupils are equal, round, and reactive to light.  Cardiovascular: Normal rate and regular rhythm.    Respiratory: Effort normal and breath sounds normal.  GI: Soft. Bowel sounds are normal.  Genitourinary:  No CVAT today  Musculoskeletal: Normal range of motion.  Neurological: He is alert and oriented to person, place, and time.  Skin: Skin is warm and dry.  Psychiatric: He has a normal mood and affect. His behavior is normal. Judgment and thought content normal.     Assessment/Plan  1 - Flank Pain / H/o Nephrolithaisis  / Left Ureteral Stone - We re-discussed ureteroscopic stone manipulation with basketing and laser-lithotripsy in detail.  We discussed risks including bleeding, infection, damage to kidney / ureter  bladder, rarely loss of kidney. We discussed anesthetic risks and rare but serious surgical complications including DVT, PE, MI, and mortality. We specifically addressed that in 5-10% of cases a staged approach is required with stenting followed by re-attempt ureteroscopy if anatomy unfavorable. The patient voiced understanding and wises to proceed.   2 - Lower Urinary Tract Symptoms - Now off flomax as had orthostasis, and overal lbother miimal  3 - Left Renal Mass - Stability for past 8 years and small size favors indolent course, observe.   Mark Wilkinson 08/13/2012, 6:13 AM

## 2012-08-13 NOTE — Brief Op Note (Signed)
08/13/2012  8:10 AM  PATIENT:  Mark Wilkinson  79 y.o. male  PRE-OPERATIVE DIAGNOSIS:  LEFT URETERAL STONE  POST-OPERATIVE DIAGNOSIS:  LEFT URETERAL STONE  PROCEDURE:  Procedure(s): CYSTOSCOPY WITH RETROGRADE PYELOGRAM, URETEROSCOPY AND STENT PLACEMENT (Left), Left Laser Lithotripsy  SURGEON:  Surgeon(s) and Role:    * Sebastian Ache, MD - Primary  PHYSICIAN ASSISTANT:   ASSISTANTS: none   ANESTHESIA:   general  EBL:  Total I/O In: 1000 [I.V.:1000] Out: -   BLOOD ADMINISTERED:none  DRAINS: none   LOCAL MEDICATIONS USED:  NONE  SPECIMEN:  Source of Specimen:  Left Ureteral Stone  DISPOSITION OF SPECIMEN:  [facility] for Compositional Analysis  COUNTS:  YES  TOURNIQUET:  * No tourniquets in log *  DICTATION: .Other Dictation: Dictation Number [id number]  PLAN OF CARE: Discharge to home after PACU  PATIENT DISPOSITION:  PACU - hemodynamically stable.   Delay start of Pharmacological VTE agent (>24hrs) due to surgical blood loss or risk of bleeding: yes

## 2012-08-13 NOTE — Op Note (Signed)
Mark Wilkinson, Mark Wilkinson NO.:  [id number]  MEDICAL RECORD NO.:  [id number]  LOCATION:  B16C                         FACILITY:  [facility]  PHYSICIAN:  Sebastian Ache, MD     DATE OF BIRTH:  [birth year]  DATE OF PROCEDURE:  08/13/2012 DATE OF DISCHARGE:  08/13/2012                              OPERATIVE REPORT   DIAGNOSES:  Left ureteral stone and flank pain.  PROCEDURE: 1. Cystoscopy with left retrograde pyelogram and interpretation. 2. Left ureteroscopic stone manipulation with laser lithotripsy. 3. Placement of left ureteral stent 6 x 26 with tether to the dorsum     of the penis.  SPECIMEN:  Left ureteral stone for composition analysis.  FINDINGS: 1. Left distal moderately obstructing ureteral stone with mild     proximal hydroureteronephrosis. 2. No additional calcifications in the left side in the kidney or     ureter.  ESTIMATED BLOOD LOSS:  Nil.  COMPLICATIONS:  None.  DRAINS:  None.  INDICATION:  Mr. Niu is an very pleasant 79 year old gentleman who on workup of acute left flank pain was found to have a left distal ureteral stone and hydronephrosis.  He underwent initial trial of medical expulsive therapy.  However, he did not pass the stone.  His symptoms were refractory.  Options were discussed including further medical therapy versus ureteroscopy versus shockwave lithotripsy, and wished to proceed with ureteroscopy.  Informed consent was obtained and placed in medical record.  PROCEDURE IN DETAIL:  The patient being Suren Payne verified using left ureteroscopic stone manipulation was confirmed.  The procedure was carried out.  Time-out was performed.  Intravenous antibiotics administered.  General LMA anesthesia was introduced.  The patient was placed into a low lithotomy position and the sterile field was created by prepping and draping the patient's penis, perineum, and proximal thighs using iodine x3.  Next, cystourethroscopy was performed  using a 22-French rigid cystoscope with 12 degree offset lens.  Inspection of the anterior and posterior urethra revealed only some mild trilobar prostatic hypertrophy with a moderate median lobe.  Bladder revealed moderate trabeculation.  There were small cellule on the left side.  No papillary lesions were seen.  Ureteral orifices in normal anatomic position.  Attention was directed at cannulation of the left ureteral orifice with a 6-French end-hole catheter and left retrograde pyelogram was seen.  Left retrograde pyelogram demonstrate a single left ureter and single system left kidney.  There was mild to moderate hydroureteronephrosis to the level of the ureterovesical junction at which point, a filling defect was noted consistent with known stone.  A 0.038 Glidewire was advanced at the level of the upper pole and set aside as a safety wire. Next, the semi-rigid ureteroscopy was performed of the distal ureter alongside a .038 sensor working wire and an 8-French feeding tube in the urinary bladder for pressure release was performed using normal saline irrigation under pressure.  Ureteroscopy corroborated a  large distal ureteral stone.  This appeared to be much too large for simple basketing as such holmium laser energy was applied to the stone using settings of 0.5 joules and 5 hertz with a 200 nanometer fiber.  The  stone was broken into approximately 4 smaller fragments. Each of these were grasped with an escape basket and brought to the level of the urinary bladder and set aside for later retrieval.  Semi- rigid ureteroscopy of the remainder of the ureter revealed no mucosal abnormalities and no additional calcifications.  The semi-rigid ureteroscope was then exchanged for the 6-French flexible ureteroscope over the sensor working wire which allowed systematic inspection of the entire left kidney.  Each calix was carefully inspected and no additional calcifications or mucosal  abnormalities were found.  The entire length of the ureter was once again inspected and no mucosal abnormalities were encountered.  Finally, a new 6 x 26 double-J stent was placed over the remaining safety wire using cystoscopic and fluoroscopic guidance.  Good proximal and distal curl were noted and tether was fashioned to the dorsum of the penis.  The stone fragments were irrigated from the bladder set aside for compositional analysis. Bladder was emptied per cystoscope.  Procedure was then terminated.  The patient tolerated the procedure well.  There were no immediate periprocedural complications.  The patient was taken to postanesthesia care unit in stable condition.          ______________________________ Sebastian Ache, MD     TM/MEDQ  D:  08/13/2012  T:  08/13/2012  Job:  [id number]

## 2012-08-13 NOTE — Transfer of Care (Signed)
Immediate Anesthesia Transfer of Care Note  Patient: Mark Wilkinson  Procedure(s) Performed: Procedure(s) (LRB): CYSTOSCOPY WITH RETROGRADE PYELOGRAM, URETEROSCOPY AND STENT PLACEMENT (Left)  Patient Location: Patient transported to PACU with oxygen via face mask at 4 Liters / Min  Anesthesia Type: General  Level of Consciousness: awake and alert   Airway & Oxygen Therapy: Patient Spontanous Breathing and Patient connected to face mask oxygen  Post-op Assessment: Report given to PACU RN and Post -op Vital signs reviewed and stable  Post vital signs: Reviewed and stable  Dentition: Teeth and oropharynx remain in pre-op condition  Complications: No apparent anesthesia complications

## 2012-08-13 NOTE — Progress Notes (Signed)
Enroute to phase 2  pt needed to void. While using BR pt partially pulled on string & 2-3" of ureter stent noted from urethral meatus. Dr Berneice Heinrich notified & orders was to remove stent. When returned to bathroom to remove stent,  the stent was noted in trash. Pt stated he had removed stent.

## 2012-08-14 ENCOUNTER — Encounter (HOSPITAL_BASED_OUTPATIENT_CLINIC_OR_DEPARTMENT_OTHER): Payer: Self-pay | Admitting: Urology

## 2012-08-14 NOTE — Anesthesia Postprocedure Evaluation (Signed)
  Anesthesia Post-op Note  Patient: Mark Wilkinson  Procedure(s) Performed: Procedure(s) (LRB): CYSTOSCOPY WITH RETROGRADE PYELOGRAM, URETEROSCOPY AND STENT PLACEMENT (Left)  Patient Location: PACU  Anesthesia Type: General  Level of Consciousness: awake and alert   Airway and Oxygen Therapy: Patient Spontanous Breathing  Post-op Pain: mild  Post-op Assessment: Post-op Vital signs reviewed, Patient's Cardiovascular Status Stable, Respiratory Function Stable, Patent Airway and No signs of Nausea or vomiting  Last Vitals:  Filed Vitals:   08/13/12 1145  BP: 141/73  Pulse:   Temp: 36.2 C  Resp:     Post-op Vital Signs: stable   Complications: No apparent anesthesia complications

## 2012-08-17 DIAGNOSIS — N200 Calculus of kidney: Secondary | ICD-10-CM | POA: Diagnosis not present

## 2012-08-17 LAB — NMR, LIPOPROFILE

## 2012-08-27 ENCOUNTER — Ambulatory Visit (INDEPENDENT_AMBULATORY_CARE_PROVIDER_SITE_OTHER): Payer: Medicare Other | Admitting: Family Medicine

## 2012-08-27 ENCOUNTER — Encounter: Payer: Self-pay | Admitting: Family Medicine

## 2012-08-27 VITALS — BP 140/89 | HR 62 | Temp 97.0°F | Ht 68.0 in | Wt 205.0 lb

## 2012-08-27 DIAGNOSIS — E785 Hyperlipidemia, unspecified: Secondary | ICD-10-CM | POA: Diagnosis not present

## 2012-08-27 DIAGNOSIS — N200 Calculus of kidney: Secondary | ICD-10-CM | POA: Diagnosis not present

## 2012-08-27 DIAGNOSIS — M545 Low back pain: Secondary | ICD-10-CM | POA: Diagnosis not present

## 2012-08-27 DIAGNOSIS — I10 Essential (primary) hypertension: Secondary | ICD-10-CM

## 2012-08-27 NOTE — Patient Instructions (Addendum)
FOBT given Fall precautions discussed Continue current meds and therapeutic lifestyle changes 

## 2012-08-27 NOTE — Progress Notes (Signed)
  Subjective:    Patient ID: Mark Wilkinson, male    DOB: [birth year], 79 y.o.   MRN: [id number]  HPI This patient presents for recheck of multiple medical problems. His wife accompanies the patient today.  Patient Active Problem List  Diagnosis  . Essential hypertension, benign  . Other and unspecified hyperlipidemia  . Diaphragmatic hernia without mention of obstruction or gangrene  . Gout, unspecified  . Calculus of kidney  . [place], illinios [id number]  . Asymptomatic varicose veins  . Lumbago    In addition, see ROS  The allergies, current medications, past medical history, surgical history, family and social history are reviewed.  Immunizations reviewed.  Health maintenance reviewed.  The following items are outstanding: NONE  Recent labs were reviewed with patient   Review of Systems  Constitutional: Positive for appetite change (decreased).  HENT: Negative.   Respiratory: Negative.   Cardiovascular: Negative.   Gastrointestinal: Negative.   Genitourinary: Positive for frequency.  Musculoskeletal: Positive for back pain (LBP > upper) and arthralgias (bilateral knee).  Neurological: Negative.        Objective:   Physical Exam BP 140/89  Pulse 62  Temp(Src) 97 F (36.1 C) (Oral)  Ht 5\' 8"  (1.727 m)  Wt 205 lb (92.987 kg)  BMI 31.18 kg/m2  The patient appeared well nourished and normally developed,somehwat kyphotic in appearance, alert and oriented to time and place. Speech, behavior and judgement appear normal. Vital signs as documented.  Head exam is unremarkable. No scleral icterus or pallor noted.  Neck is without jugular venous distension, thyromegally, or carotid bruits. Carotid upstrokes are brisk bilaterally. No cervical adenopathy. Lungs are clear anteriorly and posteriorly to auscultation. Normal respiratory effort. Cardiac exam reveals regular rate and rhythm. First and second heart sounds normal. No murmurs, rubs or gallops.  Abdominal exam reveals  normal bowl sounds, no masses, no organomegaly and no aortic enlargement. No inguinal adenopathy. Extremities are nonedematous and both femoral and pedal pulses are normal. Definite joint stiffness and tenderness both knees. Skeletal kyphosis is noted. Skin without pallor or jaundice.  Warm and dry, without rash. Neurologic exam reveals normal deep tendon reflexes and normal sensation.          Assessment & Plan:  1. Essential hypertension, benign Continue meds as doing  2. Other and unspecified hyperlipidemia Continue therapeutic lifestyle changes  3. Calculus of kidney Followup with Dr. Kathrynn Running   4. Lumbago Continue walking and being careful not to fall  5. Handicap sticker was completed for patient  6. Osteoarthritis of knees and back

## 2012-08-31 DIAGNOSIS — D4959 Neoplasm of unspecified behavior of other genitourinary organ: Secondary | ICD-10-CM | POA: Diagnosis not present

## 2012-08-31 DIAGNOSIS — N200 Calculus of kidney: Secondary | ICD-10-CM | POA: Diagnosis not present

## 2012-09-01 DIAGNOSIS — I10 Essential (primary) hypertension: Secondary | ICD-10-CM | POA: Diagnosis not present

## 2012-09-01 DIAGNOSIS — M9981 Other biomechanical lesions of cervical region: Secondary | ICD-10-CM | POA: Diagnosis not present

## 2012-09-01 DIAGNOSIS — Z713 Dietary counseling and surveillance: Secondary | ICD-10-CM | POA: Diagnosis not present

## 2012-09-01 DIAGNOSIS — M5137 Other intervertebral disc degeneration, lumbosacral region: Secondary | ICD-10-CM | POA: Diagnosis not present

## 2012-09-01 DIAGNOSIS — M25569 Pain in unspecified knee: Secondary | ICD-10-CM | POA: Diagnosis not present

## 2012-09-01 DIAGNOSIS — M999 Biomechanical lesion, unspecified: Secondary | ICD-10-CM | POA: Diagnosis not present

## 2012-09-04 ENCOUNTER — Encounter: Payer: Self-pay | Admitting: Family Medicine

## 2012-09-23 ENCOUNTER — Other Ambulatory Visit: Payer: Self-pay | Admitting: Family Medicine

## 2012-09-28 ENCOUNTER — Other Ambulatory Visit: Payer: Self-pay | Admitting: Family Medicine

## 2012-09-30 DIAGNOSIS — H348192 Central retinal vein occlusion, unspecified eye, stable: Secondary | ICD-10-CM | POA: Diagnosis not present

## 2012-09-30 DIAGNOSIS — H35359 Cystoid macular degeneration, unspecified eye: Secondary | ICD-10-CM | POA: Diagnosis not present

## 2012-10-13 DIAGNOSIS — M5137 Other intervertebral disc degeneration, lumbosacral region: Secondary | ICD-10-CM | POA: Diagnosis not present

## 2012-10-13 DIAGNOSIS — M999 Biomechanical lesion, unspecified: Secondary | ICD-10-CM | POA: Diagnosis not present

## 2012-10-13 DIAGNOSIS — M9981 Other biomechanical lesions of cervical region: Secondary | ICD-10-CM | POA: Diagnosis not present

## 2012-10-13 DIAGNOSIS — M25569 Pain in unspecified knee: Secondary | ICD-10-CM | POA: Diagnosis not present

## 2012-10-13 DIAGNOSIS — I10 Essential (primary) hypertension: Secondary | ICD-10-CM | POA: Diagnosis not present

## 2012-10-30 DIAGNOSIS — M1710 Unilateral primary osteoarthritis, unspecified knee: Secondary | ICD-10-CM | POA: Diagnosis not present

## 2012-11-04 DIAGNOSIS — H35359 Cystoid macular degeneration, unspecified eye: Secondary | ICD-10-CM | POA: Diagnosis not present

## 2012-11-04 DIAGNOSIS — H348192 Central retinal vein occlusion, unspecified eye, stable: Secondary | ICD-10-CM | POA: Diagnosis not present

## 2012-11-24 DIAGNOSIS — M5137 Other intervertebral disc degeneration, lumbosacral region: Secondary | ICD-10-CM | POA: Diagnosis not present

## 2012-11-24 DIAGNOSIS — I10 Essential (primary) hypertension: Secondary | ICD-10-CM | POA: Diagnosis not present

## 2012-11-24 DIAGNOSIS — M999 Biomechanical lesion, unspecified: Secondary | ICD-10-CM | POA: Diagnosis not present

## 2012-11-24 DIAGNOSIS — M9981 Other biomechanical lesions of cervical region: Secondary | ICD-10-CM | POA: Diagnosis not present

## 2012-11-24 DIAGNOSIS — M25569 Pain in unspecified knee: Secondary | ICD-10-CM | POA: Diagnosis not present

## 2012-11-25 ENCOUNTER — Ambulatory Visit (INDEPENDENT_AMBULATORY_CARE_PROVIDER_SITE_OTHER): Payer: Medicare Other | Admitting: Cardiology

## 2012-11-25 ENCOUNTER — Encounter: Payer: Self-pay | Admitting: Cardiology

## 2012-11-25 VITALS — BP 127/83 | HR 62 | Ht 70.0 in | Wt 210.8 lb

## 2012-11-25 DIAGNOSIS — I10 Essential (primary) hypertension: Secondary | ICD-10-CM | POA: Diagnosis not present

## 2012-11-25 DIAGNOSIS — Z01810 Encounter for preprocedural cardiovascular examination: Secondary | ICD-10-CM

## 2012-11-25 NOTE — Progress Notes (Signed)
HPI The patient presents for preoperative evaluation prior to having knee surgery. He has no past cardiac history but he does have a history of hypertension and some family history of early onset coronary disease. He does report distant treadmill stress testing. 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 He still remains somewhat active despite bilateral knee problems. He is able to vacuum. He can climb stairs at church. With this he denies any cardiovascular symptoms. The patient denies any symptoms such as chest discomfort, neck or arm discomfort. There has been no shortness of breath, PND or orthopnea. There have been no reported palpitations, presyncope or syncope.  Allergies  Allergen Reactions  . Penicillins Other (See Comments)    Unknown     Current Outpatient Prescriptions  Medication Sig Dispense Refill  . allopurinol (ZYLOPRIM) 300 MG tablet TAKE 1 TABLET DAILY  90 tablet  1  . atenolol (TENORMIN) 50 MG tablet Take 25-50 mg by mouth. 25 mg every morning and 50 mg every evening      . atorvastatin (LIPITOR) 40 MG tablet Take 40 mg by mouth every evening.       . B Complex-C (SUPER B COMPLEX PO) Take 1 tablet by mouth daily.      . Cholecalciferol (VITAMIN D3) 1000 UNITS CAPS Take 3 capsules by mouth daily.       Marland Kitchen esomeprazole (NEXIUM) 40 MG capsule Take 40 mg by mouth daily before breakfast.        . GLUCOSAMINE PO Take 1 tablet by mouth 2 (two) times daily.      . meloxicam (MOBIC) 7.5 MG tablet Take 7.5 mg by mouth daily as needed for pain.       . Multiple Vitamin (MULTIVITAMIN WITH MINERALS) TABS Take 1 tablet by mouth daily.      . niacin (NIASPAN) 500 MG CR tablet Take 1,000 mg by mouth at bedtime.       . Omega-3 Fatty Acids (FISH OIL) 1000 MG CAPS Take 1 capsule by mouth 2 (two) times daily.       . quinapril (ACCUPRIL) 10 MG tablet TAKE 1 TABLET EVERY DAY  90 tablet  0  . Ranibizumab (LUCENTIS) 0.3 MG/0.05ML SOLN Inject 1 application  into the eye. Injection q 6-8 weeks      . timolol (BETIMOL) 0.5 % ophthalmic solution Place 1 drop into the left eye 2 (two) times daily.       No current facility-administered medications for this visit.    Past Medical History  Diagnosis Date  . Essential hypertension, benign   . Other and unspecified hyperlipidemia   . Diaphragmatic hernia without mention of obstruction or gangrene   . Gout, unspecified   . Calculus of kidney   . [place], illinios [id number]   . Asymptomatic varicose veins   . Lumbago     Past Surgical History  Procedure Laterality Date  . Appendectomy    . Tonsilectomy, adenoidectomy, bilateral myringotomy and tubes    . Cholecystectomy    . Cystoscopy with retrograde pyelogram, ureteroscopy and stent placement Left 08/13/2012    Procedure: CYSTOSCOPY WITH RETROGRADE PYELOGRAM, URETEROSCOPY AND STENT PLACEMENT;  Surgeon: Sebastian Ache, MD;  Location: [facility];  Service: Urology;  Laterality: Left;    Family History  Problem Relation Age of Onset  . Heart disease Mother   . Hypertension Mother   . Stroke Father     History  Social History  . Marital Status: Married    Spouse Name: N/A    Number of Children: N/A  . Years of Education: N/A   Occupational History  . Not on file.   Social History Main Topics  . Smoking status: Former Games developer  . Smokeless tobacco: Not on file  . Alcohol Use: No  . Drug Use: No  . Sexually Active: Not on file   Other Topics Concern  . Not on file   Social History Narrative  . No narrative on file    ROS:  Positive for back and knee pain.  Otherwise as stated in the HPI and negative for all other systems.  PHYSICAL EXAM BP 127/83  Pulse 62  Ht 5\' 10"  (1.778 m)  Wt 210 lb 12.8 oz (95.618 kg)  BMI 30.25 kg/m2 GENERAL:  Well appearing HEENT:  Pupils equal round and reactive, fundi not visualized, oral mucosa unremarkable NECK:  No jugular venous distention, waveform within normal limits,  carotid upstroke brisk and symmetric, no bruits, no thyromegaly LYMPHATICS:  No cervical, inguinal adenopathy LUNGS:  Clear to auscultation bilaterally BACK:  No CVA tenderness CHEST:  Unremarkable HEART:  PMI not displaced or sustained,S1 and S2 within normal limits, no S3, no S4, no clicks, no rubs, no murmurs ABD:  Flat, positive bowel sounds normal in frequency in pitch, no bruits, no rebound, no guarding, no midline pulsatile mass, no hepatomegaly, no splenomegaly EXT:  2 plus pulses throughout, no edema, no cyanosis no clubbing SKIN:  No rashes no nodules NEURO:  Cranial nerves II through XII grossly intact, motor grossly intact throughout PSYCH:  Cognitively intact, oriented to person place and time  EKG:  Sinus rhythm, rate 62, axis within normal limits, intervals within normal limits, no acute ST-T wave changes. 11/25/2012  ASSESSMENT AND PLAN   PREOP:  The patient has a reasonable functional level (greater than 4 METS).  He has no high-risk findings or symptoms. He is going for surgery that is not high risk from a cardiovascular standpoint.  Therefore, based on ACC/AHA guidelines, the patient would be at acceptable risk for the planned procedure without further cardiovascular testing.  HTN:  The blood pressure is at target. No change in medications is indicated. We will continue with therapeutic lifestyle changes (TLC).

## 2012-11-25 NOTE — Patient Instructions (Signed)
The current medical regimen is effective;  continue present plan and medications.  Follow up as needed 

## 2012-11-26 ENCOUNTER — Encounter (HOSPITAL_COMMUNITY): Payer: Self-pay | Admitting: Pharmacy Technician

## 2012-12-02 ENCOUNTER — Other Ambulatory Visit (HOSPITAL_COMMUNITY): Payer: Self-pay | Admitting: Orthopedic Surgery

## 2012-12-02 NOTE — Patient Instructions (Addendum)
Mark Wilkinson  12/02/2012   Your procedure is scheduled on: 12-08-12  Report to [facility] at 0515 AM.  Call this number if you have problems the morning of surgery [phone]   Remember:   Do not eat food or drink liquids :After Midnight.     Take these medicines the morning of surgery with A SIP OF WATER: Atenolol,Nexium, may use eye drops                                SEE [facility] PREPARING FOR SURGERY SHEET   Do not wear jewelry, make-up or nail polish.  Do not wear lotions, powders, or perfumes. You may wear deodorant.   Men may shave face and neck.  Do not bring valuables to the hospital. [facility] IS NOT RESPONSIBLE FOR VALUEABLES.  Contacts, dentures or bridgework may not be worn into surgery.  Leave suitcase in the car. After surgery it may be brought to your room.  For patients admitted to the hospital, checkout time is 11:00 AM the day of discharge.   Patients discharged the day of surgery will not be allowed to drive home.  Name and phone number of your driver: spouse-[name]  Special Instructions: N/A   Please read over the following fact sheets that you were given: MRSA Information, blood fact sheet, incentive spirometer fact sheet  Call Merleen Nicely  RN pre op nurse if needed [phone]    FAILURE TO FOLLOW THESE INSTRUCTIONS MAY RESULT IN THE CANCELLATION OF YOUR SURGERY.  PATIENT SIGNATURE___________________________________________  NURSE SIGNATURE_____________________________________________

## 2012-12-02 NOTE — Progress Notes (Signed)
Cardiac clearance note dr hochrein on chart Chest 2 view xray 07-24-12 epic ekg 11-25-12 epic Lov/ cardiac clearance nopte dr hochrein 11-25-12 epic

## 2012-12-03 ENCOUNTER — Other Ambulatory Visit: Payer: Self-pay | Admitting: Family Medicine

## 2012-12-03 ENCOUNTER — Encounter (HOSPITAL_COMMUNITY)
Admission: RE | Admit: 2012-12-03 | Discharge: 2012-12-03 | Disposition: A | Discharge status: Home / Self Care | Payer: Medicare Other | Source: Ambulatory Visit | Attending: Orthopedic Surgery | Admitting: Orthopedic Surgery

## 2012-12-03 ENCOUNTER — Encounter (HOSPITAL_COMMUNITY): Payer: Self-pay

## 2012-12-03 DIAGNOSIS — D62 Acute posthemorrhagic anemia: Secondary | ICD-10-CM | POA: Diagnosis not present

## 2012-12-03 DIAGNOSIS — M25469 Effusion, unspecified knee: Secondary | ICD-10-CM | POA: Diagnosis present

## 2012-12-03 DIAGNOSIS — Z880 Allergy status to penicillin: Secondary | ICD-10-CM | POA: Diagnosis not present

## 2012-12-03 DIAGNOSIS — Z01812 Encounter for preprocedural laboratory examination: Secondary | ICD-10-CM | POA: Diagnosis not present

## 2012-12-03 DIAGNOSIS — M1710 Unilateral primary osteoarthritis, unspecified knee: Secondary | ICD-10-CM | POA: Diagnosis not present

## 2012-12-03 DIAGNOSIS — H35359 Cystoid macular degeneration, unspecified eye: Secondary | ICD-10-CM | POA: Diagnosis not present

## 2012-12-03 DIAGNOSIS — Z87891 Personal history of nicotine dependence: Secondary | ICD-10-CM | POA: Diagnosis not present

## 2012-12-03 DIAGNOSIS — E669 Obesity, unspecified: Secondary | ICD-10-CM | POA: Diagnosis present

## 2012-12-03 DIAGNOSIS — H348192 Central retinal vein occlusion, unspecified eye, stable: Secondary | ICD-10-CM | POA: Diagnosis not present

## 2012-12-03 DIAGNOSIS — I10 Essential (primary) hypertension: Secondary | ICD-10-CM | POA: Diagnosis not present

## 2012-12-03 DIAGNOSIS — M899 Disorder of bone, unspecified: Secondary | ICD-10-CM | POA: Diagnosis not present

## 2012-12-03 DIAGNOSIS — M6580 Other synovitis and tenosynovitis, unspecified site: Secondary | ICD-10-CM | POA: Diagnosis not present

## 2012-12-03 DIAGNOSIS — M949 Disorder of cartilage, unspecified: Secondary | ICD-10-CM | POA: Diagnosis present

## 2012-12-03 DIAGNOSIS — E785 Hyperlipidemia, unspecified: Secondary | ICD-10-CM | POA: Diagnosis present

## 2012-12-03 DIAGNOSIS — M109 Gout, unspecified: Secondary | ICD-10-CM | POA: Diagnosis present

## 2012-12-03 DIAGNOSIS — M25569 Pain in unspecified knee: Secondary | ICD-10-CM | POA: Diagnosis not present

## 2012-12-03 DIAGNOSIS — M545 Low back pain: Secondary | ICD-10-CM | POA: Diagnosis not present

## 2012-12-03 DIAGNOSIS — Z6830 Body mass index (BMI) 30.0-30.9, adult: Secondary | ICD-10-CM | POA: Diagnosis not present

## 2012-12-03 HISTORY — DX: Unspecified osteoarthritis, unspecified site: M19.90

## 2012-12-03 LAB — URINALYSIS, ROUTINE W REFLEX MICROSCOPIC
Ketones, ur: NEGATIVE mg/dL
Leukocytes, UA: NEGATIVE
Nitrite: NEGATIVE
Urobilinogen, UA: 1 mg/dL (ref 0.0–1.0)
pH: 6 (ref 5.0–8.0)

## 2012-12-03 LAB — CBC
HCT: 44.1 % (ref 39.0–52.0)
Hemoglobin: 15.1 g/dL (ref 13.0–17.0)
MCH: 30 pg (ref 26.0–34.0)
MCHC: 34.2 g/dL (ref 30.0–36.0)

## 2012-12-03 LAB — BASIC METABOLIC PANEL
BUN: 6 mg/dL (ref 6–23)
Chloride: 104 mEq/L (ref 96–112)
GFR calc Af Amer: >90 mL/min (ref >90)
Glucose, Bld: 140 mg/dL — ABNORMAL HIGH (ref 70–99)
Potassium: 3.9 mEq/L (ref 3.5–5.1)

## 2012-12-03 LAB — PROTIME-INR: INR: 1 (ref 0.00–1.49)

## 2012-12-03 NOTE — H&P (Signed)
TOTAL KNEE ADMISSION H&P  Patient is being admitted for right total knee arthroplasty.  Subjective:  Chief Complaint:    Right knee OA / pain.  HPI: Mark Wilkinson, 79 y.o. male, has a history of pain and functional disability in the right knee due to arthritis and has failed non-surgical conservative treatments for greater than 12 weeks to include NSAID's and/or analgesics, corticosteriod injections, viscosupplementation injections and activity modification.  Onset of symptoms was gradual, starting >10 years ago with gradually worsening course since that time. The patient noted no past surgery on the right knee(s).  Patient currently rates pain in the right knee(s) at 6 out of 10 with activity. Patient has worsening of pain with activity and weight bearing, pain that interferes with activities of daily living, pain with passive range of motion, crepitus and joint swelling.  Patient has evidence of periarticular osteophytes and joint space narrowing by imaging studies.  There is no active signs of infection.  Risks, benefits and expectations were discussed with the patient. Patient understand the risks, benefits and expectations and wishes to proceed with surgery.   D/C Plans:   Home with HHPT  Post-op Meds:     Rx given for ASA, Zanaflex, Iron, Colace and MiraLax  Tranexamic Acid:   To be given  Decadron:    To be given  FYI:    ASA post-op   Patient Active Problem List   Diagnosis Date Noted  . Preop cardiovascular exam 11/25/2012  . Essential hypertension, benign   . Other and unspecified hyperlipidemia   . Diaphragmatic hernia without mention of obstruction or gangrene   . Gout, unspecified   . Calculus of kidney   . [place], illinios [id number]   . Asymptomatic varicose veins   . Lumbago    Past Medical History  Diagnosis Date  . Essential hypertension, benign   . Other and unspecified hyperlipidemia   . Diaphragmatic hernia without mention of obstruction or gangrene   . Gout,  unspecified   . Calculus of kidney   . Asymptomatic varicose veins   . Lumbago   . Arthritis     Past Surgical History  Procedure Laterality Date  . Appendectomy    . Tonsilectomy, adenoidectomy, bilateral myringotomy and tubes    . Cholecystectomy    . Cystoscopy with retrograde pyelogram, ureteroscopy and stent placement Left 08/13/2012    Procedure: CYSTOSCOPY WITH RETROGRADE PYELOGRAM, URETEROSCOPY AND STENT PLACEMENT;  Surgeon: Sebastian Ache, MD;  Location: [facility];  Service: Urology;  Laterality: Left;  . Tonsillectomy    . Eye surgery      left cataract with lens implant     Allergies  Allergen Reactions  . Penicillins Other (See Comments)    Unknown     History  Substance Use Topics  . Smoking status: Former Smoker    Types: Cigarettes  . Smokeless tobacco: Not on file     Comment: Light smoker.  Quit 1962  . Alcohol Use: No    Family History  Problem Relation Age of Onset  . Heart disease Mother     Died in her 10s  . Hypertension Mother   . Stroke Father   . CAD Brother     MI 52s     Review of Systems  Constitutional: Negative.   HENT: Negative.   Eyes: Negative.   Respiratory: Negative.   Cardiovascular: Negative.   Gastrointestinal: Negative.   Genitourinary: Positive for urgency and frequency.  Musculoskeletal: Positive for back  pain and joint pain.  Skin: Negative.   Neurological: Negative.   Endo/Heme/Allergies: Negative.   Psychiatric/Behavioral: Positive for memory loss.    Objective:  Physical Exam  Constitutional: He is oriented to person, place, and time. He appears well-developed and well-nourished.  HENT:  Head: Normocephalic and atraumatic.  Mouth/Throat: Oropharynx is clear and moist.  Eyes: Pupils are equal, round, and reactive to light.  Neck: Neck supple. No JVD present. No tracheal deviation present. No thyromegaly present.  Cardiovascular: Normal rate, regular rhythm, normal heart sounds and intact  distal pulses.   Respiratory: Effort normal and breath sounds normal. No stridor. No respiratory distress. He has no wheezes.  GI: Soft. There is no tenderness. There is no guarding.  Musculoskeletal:       Right knee: He exhibits decreased range of motion, swelling and bony tenderness. He exhibits no effusion, no deformity, no laceration and no erythema. Tenderness found.  Lymphadenopathy:    He has no cervical adenopathy.  Neurological: He is alert and oriented to person, place, and time.  Skin: Skin is warm and dry.  Psychiatric: He has a normal mood and affect.    Labs:  Estimated body mass index is 31.18 kg/(m^2) as calculated from the following:   Height as of 08/27/12: 5\' 8"  (1.727 m).   Weight as of 08/27/12: 92.987 kg (205 lb).   Imaging Review Plain radiographs demonstrate severe degenerative joint disease of the right knee(s). The overall alignment is neutral. The bone quality appears to be good for age and reported activity level.  Assessment/Plan:  End stage arthritis, right knee   The patient history, physical examination, clinical judgment of the provider and imaging studies are consistent with end stage degenerative joint disease of the right knee(s) and total knee arthroplasty is deemed medically necessary. The treatment options including medical management, injection therapy arthroscopy and arthroplasty were discussed at length. The risks and benefits of total knee arthroplasty were presented and reviewed. The risks due to aseptic loosening, infection, stiffness, patella tracking problems, thromboembolic complications and other imponderables were discussed. The patient acknowledged the explanation, agreed to proceed with the plan and consent was signed. Patient is being admitted for inpatient treatment for surgery, pain control, PT, OT, prophylactic antibiotics, VTE prophylaxis, progressive ambulation and ADL's and discharge planning. The patient is planning to be discharged  home with home health services.    Mark Wilkinson Mark Wilkinson   PAC  12/03/2012, 11:32 AM

## 2012-12-03 NOTE — Progress Notes (Signed)
12/03/12 1140  OBSTRUCTIVE SLEEP APNEA  Have you ever been diagnosed with sleep apnea through a sleep study? No  Do you snore loudly (loud enough to be heard through closed doors)?  1  Do you often feel tired, fatigued, or sleepy during the daytime? 0  Has anyone observed you stop breathing during your sleep? 0  Do you have, or are you being treated for high blood pressure? 1  BMI more than 35 kg/m2? 0  Age over 77 years old? 1  Neck circumference greater than 40 cm/18 inches? 0  Gender: 1  Obstructive Sleep Apnea Score 4  Score 4 or greater  Results sent to PCP

## 2012-12-05 LAB — MRSA CULTURE

## 2012-12-07 ENCOUNTER — Encounter (HOSPITAL_COMMUNITY): Payer: Self-pay | Admitting: Anesthesiology

## 2012-12-07 DIAGNOSIS — H35359 Cystoid macular degeneration, unspecified eye: Secondary | ICD-10-CM | POA: Diagnosis not present

## 2012-12-07 DIAGNOSIS — H348192 Central retinal vein occlusion, unspecified eye, stable: Secondary | ICD-10-CM | POA: Diagnosis not present

## 2012-12-07 NOTE — Anesthesia Preprocedure Evaluation (Addendum)
Anesthesia Evaluation  Patient identified by MRN, date of birth, ID band Patient awake  General Assessment Comment:  Essential hypertension, benign     .  Other and unspecified hyperlipidemia     .  Diaphragmatic hernia without mention of obstruction or gangrene     .  Gout, unspecified     .  Calculus of kidney     .  Asymptomatic varicose veins     .  Lumbago     .  Arthritis     Reviewed: Allergy & Precautions, H&P , NPO status , Patient's Chart, lab work & pertinent test results  Airway Mallampati: II TM Distance: >3 FB Neck ROM: Full    Dental no notable dental hx.    Pulmonary neg pulmonary ROS,  CXR Feb 14 [place] breath sounds clear to auscultation  Pulmonary exam normal       Cardiovascular Exercise Tolerance: Good hypertension, Pt. on medications and Pt. on home beta blockers negative cardio ROS  Rhythm:Regular Rate:Normal  Visit/clearance Dr. Antoine Poche 11-25-12  ECG: low limb lead voltage.   Neuro/Psych Some confusion.  Neuromuscular disease negative psych ROS   GI/Hepatic negative GI ROS, Neg liver ROS,   Endo/Other  negative endocrine ROS  Renal/GU Renal disease  negative genitourinary   Musculoskeletal negative musculoskeletal ROS (+)   Abdominal (+) + obese,   Peds negative pediatric ROS (+)  Hematology negative hematology ROS (+)   Anesthesia Other Findings   Reproductive/Obstetrics negative OB ROS                         Anesthesia Physical Anesthesia Plan  ASA: III  Anesthesia Plan: Spinal   Post-op Pain Management:    Induction: Intravenous  Airway Management Planned:   Additional Equipment:   Intra-op Plan:   Post-operative Plan: Extubation in OR  Informed Consent: I have reviewed the patients History and Physical, chart, labs and discussed the procedure including the risks, benefits and alternatives for the proposed anesthesia with the patient or authorized  representative who has indicated his/her understanding and acceptance.   Dental advisory given  Plan Discussed with: CRNA  Anesthesia Plan Comments: (Discussed risks/benefits of spinal including headache, backache, failure, bleeding, infection, and nerve damage. Patient consents to spinal. Questions answered. Coagulation studies and platelet count acceptable.Discussed with patient and patient's wife.)       Anesthesia Quick Evaluation

## 2012-12-08 ENCOUNTER — Encounter (HOSPITAL_COMMUNITY): Payer: Self-pay | Admitting: Anesthesiology

## 2012-12-08 ENCOUNTER — Inpatient Hospital Stay (HOSPITAL_COMMUNITY)
Admission: RE | Admit: 2012-12-08 | Discharge: 2012-12-10 | DRG: 470 | Disposition: A | Discharge status: Home Health | Payer: Medicare Other | Source: Ambulatory Visit | Attending: Orthopedic Surgery | Admitting: Orthopedic Surgery

## 2012-12-08 ENCOUNTER — Encounter (HOSPITAL_COMMUNITY): Payer: Self-pay

## 2012-12-08 ENCOUNTER — Inpatient Hospital Stay (HOSPITAL_COMMUNITY): Payer: Medicare Other | Admitting: Anesthesiology

## 2012-12-08 ENCOUNTER — Encounter (HOSPITAL_COMMUNITY)
Admission: RE | Disposition: A | Discharge status: Home Health | Payer: Self-pay | Source: Ambulatory Visit | Attending: Orthopedic Surgery

## 2012-12-08 DIAGNOSIS — M6580 Other synovitis and tenosynovitis, unspecified site: Secondary | ICD-10-CM | POA: Diagnosis present

## 2012-12-08 DIAGNOSIS — Z87891 Personal history of nicotine dependence: Secondary | ICD-10-CM

## 2012-12-08 DIAGNOSIS — D500 Iron deficiency anemia secondary to blood loss (chronic): Secondary | ICD-10-CM | POA: Diagnosis not present

## 2012-12-08 DIAGNOSIS — M109 Gout, unspecified: Secondary | ICD-10-CM | POA: Diagnosis present

## 2012-12-08 DIAGNOSIS — D62 Acute posthemorrhagic anemia: Secondary | ICD-10-CM | POA: Diagnosis not present

## 2012-12-08 DIAGNOSIS — M25469 Effusion, unspecified knee: Secondary | ICD-10-CM | POA: Diagnosis present

## 2012-12-08 DIAGNOSIS — M899 Disorder of bone, unspecified: Secondary | ICD-10-CM | POA: Diagnosis present

## 2012-12-08 DIAGNOSIS — I10 Essential (primary) hypertension: Secondary | ICD-10-CM | POA: Diagnosis present

## 2012-12-08 DIAGNOSIS — E785 Hyperlipidemia, unspecified: Secondary | ICD-10-CM | POA: Diagnosis present

## 2012-12-08 DIAGNOSIS — Z6830 Body mass index (BMI) 30.0-30.9, adult: Secondary | ICD-10-CM

## 2012-12-08 DIAGNOSIS — Z96651 Presence of right artificial knee joint: Secondary | ICD-10-CM

## 2012-12-08 DIAGNOSIS — Z96659 Presence of unspecified artificial knee joint: Secondary | ICD-10-CM

## 2012-12-08 DIAGNOSIS — Z01812 Encounter for preprocedural laboratory examination: Secondary | ICD-10-CM

## 2012-12-08 DIAGNOSIS — E669 Obesity, unspecified: Secondary | ICD-10-CM | POA: Diagnosis present

## 2012-12-08 DIAGNOSIS — M1710 Unilateral primary osteoarthritis, unspecified knee: Principal | ICD-10-CM | POA: Diagnosis present

## 2012-12-08 DIAGNOSIS — Z880 Allergy status to penicillin: Secondary | ICD-10-CM

## 2012-12-08 HISTORY — PX: TOTAL KNEE ARTHROPLASTY: SHX125

## 2012-12-08 LAB — TYPE AND SCREEN: Antibody Screen: NEGATIVE

## 2012-12-08 SURGERY — ARTHROPLASTY, KNEE, TOTAL
Anesthesia: General | Site: Knee | Laterality: Right | Wound class: Clean

## 2012-12-08 MED ORDER — ASPIRIN EC 325 MG PO TBEC
325.0000 mg | DELAYED_RELEASE_TABLET | Freq: Two times a day (BID) | ORAL | Status: DC
  Administered 2012-12-08 – 2012-12-10 (×4): 325 mg via ORAL
  Filled 2012-12-08 (×6): qty 1

## 2012-12-08 MED ORDER — METHOCARBAMOL 500 MG PO TABS
500.0000 mg | ORAL_TABLET | Freq: Four times a day (QID) | ORAL | Status: DC | PRN
  Administered 2012-12-09: 500 mg via ORAL
  Filled 2012-12-08: qty 1

## 2012-12-08 MED ORDER — BUPIVACAINE LIPOSOME 1.3 % IJ SUSP
20.0000 mL | Freq: Once | INTRAMUSCULAR | Status: DC
  Filled 2012-12-08: qty 20

## 2012-12-08 MED ORDER — SODIUM CHLORIDE 0.9 % IJ SOLN
INTRAMUSCULAR | Status: DC | PRN
  Administered 2012-12-08: 14 mL

## 2012-12-08 MED ORDER — KETAMINE HCL 10 MG/ML IJ SOLN
INTRAMUSCULAR | Status: DC | PRN
  Administered 2012-12-08: 10 mg via INTRAVENOUS

## 2012-12-08 MED ORDER — DEXAMETHASONE SODIUM PHOSPHATE 10 MG/ML IJ SOLN
10.0000 mg | Freq: Once | INTRAMUSCULAR | Status: AC
  Administered 2012-12-08: 10 mg via INTRAVENOUS

## 2012-12-08 MED ORDER — TIMOLOL MALEATE 0.5 % OP SOLN
1.0000 [drp] | Freq: Two times a day (BID) | OPHTHALMIC | Status: DC
  Administered 2012-12-08 – 2012-12-10 (×4): 1 [drp] via OPHTHALMIC
  Filled 2012-12-08: qty 5

## 2012-12-08 MED ORDER — BUPIVACAINE-EPINEPHRINE 0.25% -1:200000 IJ SOLN
INTRAMUSCULAR | Status: DC | PRN
  Administered 2012-12-08: 25 mL

## 2012-12-08 MED ORDER — METHOCARBAMOL 100 MG/ML IJ SOLN
500.0000 mg | Freq: Four times a day (QID) | INTRAVENOUS | Status: DC | PRN
  Administered 2012-12-08: 500 mg via INTRAVENOUS
  Filled 2012-12-08 (×2): qty 5

## 2012-12-08 MED ORDER — KETOROLAC TROMETHAMINE 30 MG/ML IJ SOLN
INTRAMUSCULAR | Status: DC | PRN
  Administered 2012-12-08: 30 mg

## 2012-12-08 MED ORDER — ATENOLOL 25 MG PO TABS: 25.0000 mg | ORAL_TABLET | Freq: Two times a day (BID) | ORAL | Status: DC

## 2012-12-08 MED ORDER — HYDROMORPHONE HCL PF 1 MG/ML IJ SOLN: 0.5000 mg | INTRAMUSCULAR | Status: DC | PRN

## 2012-12-08 MED ORDER — SODIUM CHLORIDE 0.9 % IV SOLN
INTRAVENOUS | Status: DC
  Administered 2012-12-08 (×2): via INTRAVENOUS
  Filled 2012-12-08 (×7): qty 1000

## 2012-12-08 MED ORDER — ONDANSETRON HCL 4 MG/2ML IJ SOLN: 4.0000 mg | Freq: Four times a day (QID) | INTRAMUSCULAR | Status: DC | PRN

## 2012-12-08 MED ORDER — FERROUS SULFATE 325 (65 FE) MG PO TABS
325.0000 mg | ORAL_TABLET | Freq: Three times a day (TID) | ORAL | Status: DC
  Administered 2012-12-08 – 2012-12-10 (×6): 325 mg via ORAL
  Filled 2012-12-08 (×8): qty 1

## 2012-12-08 MED ORDER — ATORVASTATIN CALCIUM 40 MG PO TABS
40.0000 mg | ORAL_TABLET | Freq: Every evening | ORAL | Status: DC
  Administered 2012-12-08 – 2012-12-09 (×2): 40 mg via ORAL
  Filled 2012-12-08 (×3): qty 1

## 2012-12-08 MED ORDER — HYDROMORPHONE HCL PF 1 MG/ML IJ SOLN
0.5000 mg | INTRAMUSCULAR | Status: DC | PRN
  Administered 2012-12-08: 0.5 mg via INTRAVENOUS
  Filled 2012-12-08 (×2): qty 1

## 2012-12-08 MED ORDER — FENTANYL CITRATE 0.05 MG/ML IJ SOLN
25.0000 ug | INTRAMUSCULAR | Status: DC | PRN
  Administered 2012-12-08 (×3): 50 ug via INTRAVENOUS

## 2012-12-08 MED ORDER — CHLORHEXIDINE GLUCONATE 4 % EX LIQD
60.0000 mL | Freq: Once | CUTANEOUS | Status: DC
  Filled 2012-12-08: qty 60

## 2012-12-08 MED ORDER — ONDANSETRON HCL 4 MG/2ML IJ SOLN
INTRAMUSCULAR | Status: DC | PRN
  Administered 2012-12-08: 4 mg via INTRAVENOUS

## 2012-12-08 MED ORDER — LIDOCAINE HCL (CARDIAC) 20 MG/ML IV SOLN
INTRAVENOUS | Status: DC | PRN
  Administered 2012-12-08: 50 mg via INTRAVENOUS

## 2012-12-08 MED ORDER — BUPIVACAINE LIPOSOME 1.3 % IJ SUSP
INTRAMUSCULAR | Status: DC | PRN
  Administered 2012-12-08: 20 mL

## 2012-12-08 MED ORDER — ALUMINUM HYDROXIDE GEL 320 MG/5ML PO SUSP: 15.0000 mL | ORAL | Status: DC | PRN

## 2012-12-08 MED ORDER — PROPOFOL INFUSION 10 MG/ML OPTIME
INTRAVENOUS | Status: DC | PRN
  Administered 2012-12-08: 75 ug/kg/min via INTRAVENOUS

## 2012-12-08 MED ORDER — PANTOPRAZOLE SODIUM 40 MG PO TBEC
80.0000 mg | DELAYED_RELEASE_TABLET | Freq: Every day | ORAL | Status: DC
  Administered 2012-12-09 – 2012-12-10 (×2): 80 mg via ORAL
  Filled 2012-12-08 (×2): qty 2

## 2012-12-08 MED ORDER — ATENOLOL 25 MG PO TABS
25.0000 mg | ORAL_TABLET | Freq: Every day | ORAL | Status: DC
  Administered 2012-12-09 – 2012-12-10 (×2): 25 mg via ORAL
  Filled 2012-12-08 (×2): qty 1

## 2012-12-08 MED ORDER — PROMETHAZINE HCL 25 MG/ML IJ SOLN: 6.2500 mg | INTRAMUSCULAR | Status: DC | PRN

## 2012-12-08 MED ORDER — HYDROMORPHONE HCL PF 1 MG/ML IJ SOLN
0.2500 mg | INTRAMUSCULAR | Status: DC | PRN
  Administered 2012-12-08 (×2): 0.5 mg via INTRAVENOUS

## 2012-12-08 MED ORDER — ALLOPURINOL 300 MG PO TABS
300.0000 mg | ORAL_TABLET | Freq: Every day | ORAL | Status: DC
  Administered 2012-12-08 – 2012-12-10 (×3): 300 mg via ORAL
  Filled 2012-12-08 (×3): qty 1

## 2012-12-08 MED ORDER — HYDROCODONE-ACETAMINOPHEN 7.5-325 MG PO TABS
1.0000 | ORAL_TABLET | ORAL | Status: DC
  Administered 2012-12-08: 1 via ORAL
  Administered 2012-12-08 (×2): 2 via ORAL
  Administered 2012-12-09: 1 via ORAL
  Administered 2012-12-09 (×3): 2 via ORAL
  Administered 2012-12-09: 1 via ORAL
  Administered 2012-12-09: 2 via ORAL
  Administered 2012-12-10 (×2): 1 via ORAL
  Filled 2012-12-08 (×3): qty 2
  Filled 2012-12-08 (×2): qty 1
  Filled 2012-12-08 (×4): qty 2
  Filled 2012-12-08 (×3): qty 1

## 2012-12-08 MED ORDER — ONDANSETRON HCL 4 MG PO TABS: 4.0000 mg | ORAL_TABLET | Freq: Four times a day (QID) | ORAL | Status: DC | PRN

## 2012-12-08 MED ORDER — ATENOLOL 50 MG PO TABS
50.0000 mg | ORAL_TABLET | Freq: Every day | ORAL | Status: DC
  Administered 2012-12-08 – 2012-12-09 (×2): 50 mg via ORAL
  Filled 2012-12-08 (×3): qty 1

## 2012-12-08 MED ORDER — CLINDAMYCIN PHOSPHATE 900 MG/50ML IV SOLN
900.0000 mg | INTRAVENOUS | Status: AC
  Administered 2012-12-08: 900 mg via INTRAVENOUS

## 2012-12-08 MED ORDER — ZOLPIDEM TARTRATE 5 MG PO TABS: 5.0000 mg | ORAL_TABLET | Freq: Every evening | ORAL | Status: DC | PRN

## 2012-12-08 MED ORDER — MENTHOL 3 MG MT LOZG: 1.0000 | LOZENGE | OROMUCOSAL | Status: DC | PRN

## 2012-12-08 MED ORDER — PHENOL 1.4 % MT LIQD: 1.0000 | OROMUCOSAL | Status: DC | PRN

## 2012-12-08 MED ORDER — DIPHENHYDRAMINE HCL 12.5 MG/5ML PO ELIX
25.0000 mg | ORAL_SOLUTION | Freq: Four times a day (QID) | ORAL | Status: DC | PRN
  Administered 2012-12-09: 12.5 mg via ORAL
  Filled 2012-12-08: qty 5

## 2012-12-08 MED ORDER — TRANEXAMIC ACID 100 MG/ML IV SOLN
1000.0000 mg | Freq: Once | INTRAVENOUS | Status: AC
  Administered 2012-12-08: 1000 mg via INTRAVENOUS
  Filled 2012-12-08: qty 10

## 2012-12-08 MED ORDER — PHENYLEPHRINE HCL 10 MG/ML IJ SOLN
INTRAMUSCULAR | Status: DC | PRN
  Administered 2012-12-08: 40 ug via INTRAVENOUS
  Administered 2012-12-08 (×2): 80 ug via INTRAVENOUS

## 2012-12-08 MED ORDER — FENTANYL CITRATE 0.05 MG/ML IJ SOLN
INTRAMUSCULAR | Status: DC | PRN
  Administered 2012-12-08 (×3): 50 ug via INTRAVENOUS

## 2012-12-08 MED ORDER — LACTATED RINGERS IV SOLN
INTRAVENOUS | Status: DC | PRN
  Administered 2012-12-08 (×2): via INTRAVENOUS

## 2012-12-08 MED ORDER — CLINDAMYCIN PHOSPHATE 600 MG/50ML IV SOLN
600.0000 mg | Freq: Four times a day (QID) | INTRAVENOUS | Status: AC
  Administered 2012-12-08 (×2): 600 mg via INTRAVENOUS
  Filled 2012-12-08 (×2): qty 50

## 2012-12-08 SURGICAL SUPPLY — 58 items
ADH SKN CLS APL DERMABOND .7 (GAUZE/BANDAGES/DRESSINGS) ×1
BAG SPEC THK2 15X12 ZIP CLS (MISCELLANEOUS) ×1
BAG ZIPLOCK 12X15 (MISCELLANEOUS) ×2 IMPLANT
BANDAGE ELASTIC 6 VELCRO ST LF (GAUZE/BANDAGES/DRESSINGS) ×2 IMPLANT
BANDAGE ESMARK 6X9 LF (GAUZE/BANDAGES/DRESSINGS) ×1 IMPLANT
BLADE SAW SGTL 13.0X1.19X90.0M (BLADE) ×2 IMPLANT
BNDG CMPR 9X6 STRL LF SNTH (GAUZE/BANDAGES/DRESSINGS) ×1
BNDG ESMARK 6X9 LF (GAUZE/BANDAGES/DRESSINGS) ×2
BOWL SMART MIX CTS (DISPOSABLE) ×2 IMPLANT
CAPT RP KNEE ×1 IMPLANT
CEMENT HV SMART SET (Cement) ×2 IMPLANT
CLOTH BEACON ORANGE TIMEOUT ST (SAFETY) ×2 IMPLANT
CUFF TOURN SGL QUICK 34 (TOURNIQUET CUFF) ×2
CUFF TRNQT CYL 34X4X40X1 (TOURNIQUET CUFF) ×1 IMPLANT
DECANTER SPIKE VIAL GLASS SM (MISCELLANEOUS) ×2 IMPLANT
DERMABOND ADVANCED (GAUZE/BANDAGES/DRESSINGS) ×1
DERMABOND ADVANCED .7 DNX12 (GAUZE/BANDAGES/DRESSINGS) ×1 IMPLANT
DRAPE EXTREMITY T 121X128X90 (DRAPE) ×2 IMPLANT
DRAPE POUCH INSTRU U-SHP 10X18 (DRAPES) ×2 IMPLANT
DRAPE U-SHAPE 47X51 STRL (DRAPES) ×2 IMPLANT
DRSG AQUACEL AG ADV 3.5X10 (GAUZE/BANDAGES/DRESSINGS) ×2 IMPLANT
DRSG TEGADERM 4X4.75 (GAUZE/BANDAGES/DRESSINGS) ×2 IMPLANT
DURAPREP 26ML APPLICATOR (WOUND CARE) ×2 IMPLANT
ELECT REM PT RETURN 9FT ADLT (ELECTROSURGICAL) ×2
ELECTRODE REM PT RTRN 9FT ADLT (ELECTROSURGICAL) ×1 IMPLANT
EVACUATOR 1/8 PVC DRAIN (DRAIN) ×2 IMPLANT
FACESHIELD LNG OPTICON STERILE (SAFETY) ×10 IMPLANT
GAUZE SPONGE 2X2 8PLY STRL LF (GAUZE/BANDAGES/DRESSINGS) ×1 IMPLANT
GLOVE BIOGEL PI IND STRL 7.5 (GLOVE) ×1 IMPLANT
GLOVE BIOGEL PI IND STRL 8 (GLOVE) ×1 IMPLANT
GLOVE BIOGEL PI INDICATOR 7.5 (GLOVE) ×1
GLOVE BIOGEL PI INDICATOR 8 (GLOVE) ×1
GLOVE ECLIPSE 8.0 STRL XLNG CF (GLOVE) ×2 IMPLANT
GLOVE ORTHO TXT STRL SZ7.5 (GLOVE) ×4 IMPLANT
GOWN BRE IMP PREV XXLGXLNG (GOWN DISPOSABLE) ×2 IMPLANT
GOWN STRL NON-REIN LRG LVL3 (GOWN DISPOSABLE) ×2 IMPLANT
HANDPIECE INTERPULSE COAX TIP (DISPOSABLE) ×2
KIT BASIN OR (CUSTOM PROCEDURE TRAY) ×2 IMPLANT
MANIFOLD NEPTUNE II (INSTRUMENTS) ×2 IMPLANT
NDL SAFETY ECLIPSE 18X1.5 (NEEDLE) ×1 IMPLANT
NEEDLE HYPO 18GX1.5 SHARP (NEEDLE) ×2
NS IRRIG 1000ML POUR BTL (IV SOLUTION) ×3 IMPLANT
PACK TOTAL JOINT (CUSTOM PROCEDURE TRAY) ×2 IMPLANT
POSITIONER SURGICAL ARM (MISCELLANEOUS) ×2 IMPLANT
SET HNDPC FAN SPRY TIP SCT (DISPOSABLE) ×1 IMPLANT
SET PAD KNEE POSITIONER (MISCELLANEOUS) ×2 IMPLANT
SPONGE GAUZE 2X2 STER 10/PKG (GAUZE/BANDAGES/DRESSINGS) ×1
SUCTION FRAZIER 12FR DISP (SUCTIONS) ×2 IMPLANT
SUT MNCRL AB 4-0 PS2 18 (SUTURE) ×2 IMPLANT
SUT VIC AB 1 CT1 36 (SUTURE) ×2 IMPLANT
SUT VIC AB 2-0 CT1 27 (SUTURE) ×6
SUT VIC AB 2-0 CT1 TAPERPNT 27 (SUTURE) ×3 IMPLANT
SUT VLOC 180 0 24IN GS25 (SUTURE) ×2 IMPLANT
SYR 50ML LL SCALE MARK (SYRINGE) ×2 IMPLANT
TOWEL OR 17X26 10 PK STRL BLUE (TOWEL DISPOSABLE) ×3 IMPLANT
TRAY FOLEY CATH 14FRSI W/METER (CATHETERS) ×2 IMPLANT
WATER STERILE IRR 1500ML POUR (IV SOLUTION) ×3 IMPLANT
WRAP KNEE MAXI GEL POST OP (GAUZE/BANDAGES/DRESSINGS) ×2 IMPLANT

## 2012-12-08 NOTE — Anesthesia Postprocedure Evaluation (Signed)
  Anesthesia Post-op Note  Patient: Mark Wilkinson  Procedure(s) Performed: Procedure(s) (LRB): RIGHT TOTAL KNEE ARTHROPLASTY (Right)  Patient Location: PACU  Anesthesia Type: Spinal  Level of Consciousness: awake and alert   Airway and Oxygen Therapy: Patient Spontanous Breathing  Post-op Pain: mild  Post-op Assessment: Post-op Vital signs reviewed, Patient's Cardiovascular Status Stable, Respiratory Function Stable, Patent Airway and No signs of Nausea or vomiting  Last Vitals:  Filed Vitals:   12/08/12 1045  BP: 153/80  Pulse: 72  Temp:   Resp:     Post-op Vital Signs: stable   Complications: No apparent anesthesia complications. Moving both feet. He is somewhat confused. He needs to be reminded that he had knee surgery today.

## 2012-12-08 NOTE — Transfer of Care (Signed)
Immediate Anesthesia Transfer of Care Note  Patient: Mark Wilkinson  Procedure(s) Performed: Procedure(s): RIGHT TOTAL KNEE ARTHROPLASTY (Right)  Patient Location: PACU  Anesthesia Type:Spinal  Level of Consciousness: awake, alert , oriented and patient cooperative  Airway & Oxygen Therapy: Patient Spontanous Breathing and Patient connected to face mask oxygen  Post-op Assessment: Report given to PACU RN, Post -op Vital signs reviewed and stable and Patient moving all extremities  Post vital signs: Reviewed and stable  Complications: No apparent anesthesia complications

## 2012-12-08 NOTE — Evaluation (Signed)
Physical Therapy Evaluation Patient Details Name: Mark Wilkinson MRN: [id number] DOB: [birth year] Today's Date: 12/08/2012 Time: 1400-1420 PT Time Calculation (min): 20 min  PT Assessment / Plan / Recommendation History of Present Illness  pt admitted for elective RTKR 12/08/12  Clinical Impression  Pt did very well with initial mobility attempt on day of OR.  Anticipate he will progress well and d/c to home with HHPT as planned    PT Assessment  Patient needs continued PT services    Follow Up Recommendations  Home health PT    Does the patient have the potential to tolerate intense rehabilitation      Barriers to Discharge        Equipment Recommendations       Recommendations for Other Services     Frequency 7X/week    Precautions / Restrictions Precautions Precautions: Knee Required Braces or Orthoses: Knee Immobilizer - Right Restrictions Weight Bearing Restrictions: Yes RLE Weight Bearing: Weight bearing as tolerated   Pertinent Vitals/Pain Pt reports increased pain in leg with ambulation      Mobility  Bed Mobility Bed Mobility: Supine to Sit Supine to Sit: 1: +2 Total assist Supine to Sit: Patient Percentage: 50% Details for Bed Mobility Assistance: pt needs repeated cues to bridge to lift hips off bed to scoot to edge of bed,  Transfers Transfers: Sit to Stand;Stand to Sit Sit to Stand: 4: Min assist Stand to Sit: 4: Min assist Ambulation/Gait Ambulation/Gait Assistance: 3: Mod assist Ambulation Distance (Feet): 15 Feet Assistive device: Rolling walker Ambulation/Gait Assistance Details: multimodal cues for  patient to stand up straight  Gait Pattern: Step-to pattern;Trunk flexed;Decreased step length - right;Decreased step length - left Gait velocity: decreased General Gait Details: pt limited by pain with weight bearing in ambulation Stairs: No Wheelchair Mobility Wheelchair Mobility: No    Exercises General Exercises - Lower Extremity Ankle  Circles/Pumps: AROM;Both;5 reps;Supine Quad Sets: AROM;Right;5 reps;Supine Short Arc Quad: AROM;Right;5 reps;Supine   PT Diagnosis: Difficulty walking;Acute pain  PT Problem List: Decreased strength;Decreased range of motion;Decreased activity tolerance;Pain;Decreased mobility;Decreased knowledge of use of DME PT Treatment Interventions: DME instruction;Gait training;Stair training;Functional mobility training;Therapeutic activities;Therapeutic exercise     PT Goals(Current goals can be found in the care plan section) Acute Rehab PT Goals Patient Stated Goal: to go home tomorrow or the next day PT Goal Formulation: With patient/family Time For Goal Achievement: 12/22/12 Potential to Achieve Goals: Good  Visit Information  Last PT Received On: 12/08/12 Assistance Needed: +1 History of Present Illness: pt admitted for elective RTKR 12/08/12       Prior Functioning  Home Living Family/patient expects to be discharged to:: Private residence Living Arrangements: Spouse/significant other Available Help at Discharge: Family Type of Home: House Home Access: Stairs to enter Secretary/administrator of Steps: 3 Entrance Stairs-Rails: Left Home Layout: One level Home Equipment: Shower seat;Grab bars - tub/shower;Hand held Careers information officer - 2 wheels;Cane - single point Prior Function Level of Independence: Independent with assistive device(s) Comments: pt recently used a cane due to pain and swelling in knee Communication Communication: No difficulties    Cognition  Cognition Arousal/Alertness: Awake/alert Overall Cognitive Status: Within Functional Limits for tasks assessed    Extremity/Trunk Assessment Lower Extremity Assessment Lower Extremity Assessment: RLE deficits/detail RLE Deficits / Details: pt is able to activate quad and raise lower leg against gravity. He is able to flex to about 40 degrees over bolster.  Pt with c/o pain with movement of leg Cervical / Trunk  Assessment Cervical /  Trunk Assessment: Normal   Balance Balance Balance Assessed: Yes Static Sitting Balance Static Sitting - Balance Support: Bilateral upper extremity supported;Feet supported Static Sitting - Level of Assistance: 6: Modified independent (Device/Increase time) Static Standing Balance Static Standing - Balance Support: Bilateral upper extremity supported;During functional activity Static Standing - Level of Assistance: 5: Stand by assistance  End of Session PT - End of Session Equipment Utilized During Treatment: Right knee immobilizer Activity Tolerance: Patient limited by pain Patient left: in chair;with call bell/phone within reach;with family/visitor present Nurse Communication: Mobility status  GP    Rosey Bath K. [place],  [phone] 12/08/2012, 2:29 PM

## 2012-12-08 NOTE — Op Note (Signed)
NAME:  Mark Wilkinson                      MEDICAL RECORD NO.:  [id number]                             FACILITY:  [facility]      PHYSICIAN:  Madlyn Frankel. Charlann Boxer, M.D.  DATE OF BIRTH:  1927-06-09      DATE OF PROCEDURE:  12/08/2012                                     OPERATIVE REPORT         PREOPERATIVE DIAGNOSIS:  Right knee osteoarthritis.      POSTOPERATIVE DIAGNOSIS:  Right knee osteoarthritis.      FINDINGS:  The patient was noted to have complete loss of cartilage and   bone-on-bone arthritis with associated osteophytes in the medial and patellofemoral compartments of   the knee with a significant synovitis and associated effusion.      PROCEDURE:  Right total knee replacement.      COMPONENTS USED:  DePuy rotating platform posterior stabilized knee   system, a size 5 femur, 4 tibia, 10 mm PS insert, and 41 patellar   button.      SURGEON:  Madlyn Frankel. Charlann Boxer, M.D.      ASSISTANT:  Leilani Able, PA-C.      ANESTHESIA:  Spinal.      SPECIMENS:  None.      COMPLICATION:  None.      DRAINS:  One Hemovac.  EBL: <100cc      TOURNIQUET TIME:   Total Tourniquet Time Documented: Thigh (Right) - 42 minutes Total: Thigh (Right) - 42 minutes  .      The patient was stable to the recovery room.      INDICATION FOR PROCEDURE:  Mark Wilkinson is a 79 y.o. male patient of   mine.  The patient had been seen, evaluated, and treated conservatively in the   office with medication, activity modification, and injections.  The patient had   radiographic changes of bone-on-bone arthritis with endplate sclerosis and osteophytes noted.      The patient failed conservative measures including medication, injections, and activity modification, and at this point was ready for more definitive measures.   Based on the radiographic changes and failed conservative measures, the patient   decided to proceed with total knee replacement.  Risks of infection,   DVT, component failure, need for revision  surgery, postop course, and   expectations were all   discussed and reviewed.  Consent was obtained for benefit of pain   relief.      PROCEDURE IN DETAIL:  The patient was brought to the operative theater.   Once adequate anesthesia, preoperative antibiotics, 2 gm of Ancef administered, the patient was positioned supine with the right thigh tourniquet placed.  The  right lower extremity was prepped and draped in sterile fashion.  A time-   out was performed identifying the patient, planned procedure, and   extremity.      The right lower extremity was placed in the [facility] leg holder.  The leg was   exsanguinated, tourniquet elevated to 250 mmHg.  A midline incision was   made followed by median parapatellar arthrotomy.  Following initial   exposure, attention was  first directed to the patella.  Precut   measurement was noted to be 24 mm.  I resected down to 14-15 mm and used a   41 patellar button to restore patellar height as well as cover the cut   surface.      The lug holes were drilled and a metal shim was placed to protect the   patella from retractors and saw blades.      At this point, attention was now directed to the femur.  The femoral   canal was opened with a drill, irrigated to try to prevent fat emboli.  An   intramedullary rod was passed at 5 degrees valgus, 11 mm of bone was   resected off the distal femur due to preoperative flexion contracture .  Following this resection, the tibia was   subluxated anteriorly.  Using the extramedullary guide, 10 mm of bone was resected off   the proximal lateral tibia.  We confirmed the gap would be   stable medially and laterally with a 10 mm insert as well as confirmed   the cut was perpendicular in the coronal plane, checking with an alignment rod.      Once this was done, I sized the femur to be a size 5 in the anterior-   posterior dimension, chose a standard component based on medial and   lateral dimension.  The size 5  rotation block was then pinned in   position anterior referenced using the C-clamp to set rotation.  The   anterior, posterior, and  chamfer cuts were made without difficulty nor   notching making certain that I was along the anterior cortex to help   with flexion gap stability.      The final box cut was made off the lateral aspect of distal femur.      At this point, the tibia was sized to be a size 4 particularly after removing medial osteophytes, the size 4 tray was   then pinned in position through the medial third of the tubercle,   drilled, and keel punched.  Trial reduction was now carried with a 5 femur,  4 tibia, a 10 mm insert, and the 41 patella botton.  The knee was brought to   extension, full extension with good flexion stability with the patella   tracking through the trochlea without application of pressure.  Given   all these findings, the trial components removed.  Final components were   opened and cement was mixed.  The knee was irrigated with normal saline   solution and pulse lavage.  The synovial lining was   then injected with 0.25% Marcaine with epinephrine and 1 cc of Toradol,   total of 61 cc.      The knee was irrigated.  Final implants were then cemented onto clean and   dried cut surfaces of bone with the knee brought to extension with a 10 mm trial insert.      Once the cement had fully cured, the excess cement was removed   throughout the knee.  I confirmed I was satisfied with the range of   motion and stability, and the final 10 mm PS insert was chosen.  It was   placed into the knee.      The tourniquet had been let down at 41 minutes.  No significant   hemostasis required.  The medium Hemovac drain was placed deep.  The   extensor mechanism was then reapproximated using #1  Vicryl with the knee   in flexion.  The   remaining wound was closed with 2-0 Vicryl and running 4-0 Monocryl.   The knee was cleaned, dried, dressed sterilely using Dermabond  and   Aquacel dressing.  Drain site dressed separately.  The patient was then   brought to recovery room in stable condition, tolerating the procedure   well.   Please note that Physician Assistant, Leilani Able, was present for the entirety of the case, and was utilized for pre-operative positioning, peri-operative retractor management, general facilitation of the procedure.  He was also utilized for primary wound closure at the end of the case.              Madlyn Frankel Charlann Boxer, M.D.

## 2012-12-08 NOTE — Interval H&P Note (Signed)
History and Physical Interval Note:  12/08/2012 6:54 AM  Mark Wilkinson  has presented today for surgery, with the diagnosis of RIGHT KNEE OA  The various methods of treatment have been discussed with the patient and family. After consideration of risks, benefits and other options for treatment, the patient has consented to  Procedure(s): RIGHT TOTAL KNEE ARTHROPLASTY (Right) as a surgical intervention .  The patient's history has been reviewed, patient examined, no change in status, stable for surgery.  I have reviewed the patient's chart and labs.  Questions were answered to the patient's satisfaction.     Shelda Pal

## 2012-12-08 NOTE — Anesthesia Procedure Notes (Signed)
Spinal  Patient location during procedure: OR Start time: 12/08/2012 7:20 AM End time: 12/08/2012 7:27 AM Staffing Performed by: resident/CRNA  Preanesthetic Checklist Completed: patient identified, site marked, surgical consent, pre-op evaluation, timeout performed, IV checked, risks and benefits discussed and monitors and equipment checked Spinal Block Patient position: sitting Prep: Betadine Patient monitoring: heart rate, cardiac monitor, continuous pulse ox and blood pressure Approach: midline Location: L3-4 Injection technique: single-shot Needle Needle type: Spinocan  Needle gauge: 22 G Needle length: 9 cm Needle insertion depth: 6 cm Assessment Sensory level: T10 Additional Notes Neg for heme neg for paresthesias

## 2012-12-09 ENCOUNTER — Encounter (HOSPITAL_COMMUNITY): Payer: Self-pay | Admitting: Orthopedic Surgery

## 2012-12-09 DIAGNOSIS — D500 Iron deficiency anemia secondary to blood loss (chronic): Secondary | ICD-10-CM | POA: Diagnosis not present

## 2012-12-09 DIAGNOSIS — E669 Obesity, unspecified: Secondary | ICD-10-CM | POA: Diagnosis present

## 2012-12-09 LAB — BASIC METABOLIC PANEL
BUN: 19 mg/dL (ref 6–23)
Calcium: 8.7 mg/dL (ref 8.4–10.5)
GFR calc non Af Amer: 78 mL/min — ABNORMAL LOW (ref >90)
Glucose, Bld: 160 mg/dL — ABNORMAL HIGH (ref 70–99)
Potassium: 4.2 mEq/L (ref 3.5–5.1)

## 2012-12-09 LAB — CBC
HCT: 33 % — ABNORMAL LOW (ref 39.0–52.0)
Hemoglobin: 11.4 g/dL — ABNORMAL LOW (ref 13.0–17.0)
MCH: 30.2 pg (ref 26.0–34.0)
MCHC: 34.5 g/dL (ref 30.0–36.0)
MCV: 87.3 fL (ref 78.0–100.0)

## 2012-12-09 MED ORDER — ASPIRIN 325 MG PO TBEC: 325.0000 mg | DELAYED_RELEASE_TABLET | Freq: Two times a day (BID) | ORAL | Status: AC

## 2012-12-09 MED ORDER — TIZANIDINE HCL 4 MG PO CAPS: 4.0000 mg | ORAL_CAPSULE | Freq: Three times a day (TID) | ORAL | Status: DC | PRN

## 2012-12-09 MED ORDER — HYDROCODONE-ACETAMINOPHEN 7.5-325 MG PO TABS: 1.0000 | ORAL_TABLET | ORAL | Status: DC | PRN

## 2012-12-09 MED ORDER — FERROUS SULFATE 325 (65 FE) MG PO TABS: 325.0000 mg | ORAL_TABLET | Freq: Three times a day (TID) | ORAL | Status: DC

## 2012-12-09 NOTE — Progress Notes (Signed)
   Subjective: 1 Day Post-Op Procedure(s) (LRB): RIGHT TOTAL KNEE ARTHROPLASTY (Right)   Seen by Dr. Charlann Boxer Patient reports pain as mild.  Patient somewhat confused, but to his normal state.  Patient resting comfortably. No events throughout the night. Ready to be discharged home if he does well today.  Objective:   VITALS:   Filed Vitals:   12/09/12 0539  BP: 116/69  Pulse: 60  Temp: 97.7 F (36.5 C)  Resp: 16    Neurovascular intact Dorsiflexion/Plantar flexion intact Incision: dressing C/D/I No cellulitis present Compartment soft  LABS  Recent Labs  12/09/12 0443  HGB 11.4*  HCT 33.0*  WBC 12.0*  PLT 141*     Recent Labs  12/09/12 0443  NA 142  K 4.2  BUN 19  CREATININE 0.85  GLUCOSE 160*     Assessment/Plan: 1 Day Post-Op Procedure(s) (LRB): RIGHT TOTAL KNEE ARTHROPLASTY (Right) HV drain d/c'ed Foley cath d/c'ed Advance diet Up with therapy D/C IV fluids Discharge home with home health Follow up in 2 weeks at [facility]. Follow up with OLIN,Kasem Mozer D in 2 weeks.  Contact information:  [facility] [address], [address] [place] [address] [phone]    Expected ABLA  Treated with iron and will observe  Obese (BMI 30-39.9) Estimated body mass index is 30.13 kg/(m^2) as calculated from the following:   Height as of this encounter: 5\' 10"  (1.778 m).   Weight as of this encounter: 95.255 kg (210 lb). Patient also counseled that weight may inhibit the healing process Patient counseled that losing weight will help with future health issues       [name]. Adrionna Delcid   PAC  12/09/2012, 9:24 AM

## 2012-12-09 NOTE — Progress Notes (Signed)
Physical Therapy Treatment Patient Details Name: Mark Wilkinson MRN: [id number] DOB: 12-26-1927 Today's Date: 12/09/2012 Time: 1110-1200 PT Time Calculation (min): 50 min  PT Assessment / Plan / Recommendation  PT Comments   Progressing slowly with mobility. Max cues for safety throughout session. Recommended pt stay and work with PT/OT for one more day to improve stability and safety with mobility. Pt/wife agreeable. Charge nurse made aware.   Follow Up Recommendations  Home health PT;Supervision/Assistance - 24 hour     Does the patient have the potential to tolerate intense rehabilitation     Barriers to Discharge        Equipment Recommendations  None recommended by PT    Recommendations for Other Services OT consult  Frequency 7X/week   Progress towards PT Goals Progress towards PT goals: Progressing toward goals  Plan Current plan remains appropriate    Precautions / Restrictions Precautions Precautions: Knee Required Braces or Orthoses: Knee Immobilizer - Right Knee Immobilizer - Right: Discontinue once straight leg raise with < 10 degree lag Restrictions Weight Bearing Restrictions: No RLE Weight Bearing: Weight bearing as tolerated   Pertinent Vitals/Pain 5/10 R knee with activity    Mobility  Bed Mobility Supine to Sit: 3: Mod assist Details for Bed Mobility Assistance: Assist for trunk to upright and R LE off bed. Increased time. VCs safety, technique, hand placement Transfers Transfers: Sit to Stand;Stand to Sit Sit to Stand: 3: Mod assist;From bed;From elevated surface Stand to Sit: 3: Mod assist;To toilet Details for Transfer Assistance: assist to rise, stabilize, control descent. Multimodal cues for safety, technique, hand placement.  Ambulation/Gait Ambulation/Gait Assistance: 3: Mod assist;4: Min assist Ambulation Distance (Feet): 15'x1,35'x1 Feet Assistive device: Rolling walker Ambulation/Gait Assistance Details: Assist to stabilize and maneuver with  RW. Max cues for posture, safety, sequence, technique, distance from RW. Slow gait speed. Fatigues easily-seated rest break needed between walks.  Followed with recliner.  Gait Pattern: Step-to pattern;Trunk flexed;Decreased stride length;Decreased step length - right    Exercises Total Joint Exercises Ankle Circles/Pumps: AROM;15 reps;Seated Quad Sets: AROM;Right;10 reps;Seated Heel Slides: AAROM;Right;10 reps;Seated Hip ABduction/ADduction: AAROM;Right;10 reps;Seated Straight Leg Raises: AAROM;Right;10 reps;Seated Goniometric ROM: 10-75 degrees   PT Diagnosis:    PT Problem List:   PT Treatment Interventions:     PT Goals (current goals can now be found in the care plan section)    Visit Information  Last PT Received On: 12/09/12 Assistance Needed: +1 History of Present Illness: pt admitted for elective RTKR 12/08/12    Subjective Data      Cognition  Cognition Arousal/Alertness: Awake/alert Behavior During Therapy: [facility] for tasks assessed/performed Overall Cognitive Status: Within Functional Limits for tasks assessed    Balance     End of Session PT - End of Session Equipment Utilized During Treatment: Gait belt;Right knee immobilizer Activity Tolerance: Patient limited by fatigue;Patient limited by pain Patient left: in chair;with call bell/phone within reach;with family/visitor present Nurse Communication: Other (comment) (need for pt to stay one more day for safety reasons)   GP     [name], MPT Pager: [phone]

## 2012-12-09 NOTE — Evaluation (Signed)
Occupational Therapy Evaluation Patient Details Name: Mark Wilkinson MRN: [id number] DOB: 01/23/1928 Today's Date: 12/09/2012 Time: 4259-5638 OT Time Calculation (min): 27 min  OT Assessment / Plan / Recommendation History of present illness pt admitted for elective RTKR 12/08/12   Clinical Impression   Pt is currently mod -max assist for most ADL. He is currently requiring max cueing for posture, walker sequence/safety, and hand placement with functional activity. He will benefit from an additional day of therapy before d/c home with spouse.     OT Assessment  Patient needs continued OT Services    Follow Up Recommendations  Home health OT;Supervision/Assistance - 24 hour    Barriers to Discharge      Equipment Recommendations  3 in 1 bedside comode    Recommendations for Other Services    Frequency  Min 2X/week    Precautions / Restrictions Precautions Precautions: Knee;Fall Required Braces or Orthoses: Knee Immobilizer - Right Knee Immobilizer - Right: Discontinue once straight leg raise with < 10 degree lag Restrictions Weight Bearing Restrictions: No RLE Weight Bearing: Weight bearing as tolerated   Pertinent Vitals/Pain 5/10 reposition.    ADL  Eating/Feeding: Simulated;Independent Where Assessed - Eating/Feeding: Chair Grooming: Wash/dry hands;Set up Where Assessed - Grooming: Supported sitting Upper Body Bathing: Simulated;Chest;Right arm;Left arm;Abdomen;Set up;Supervision/safety Where Assessed - Upper Body Bathing: Unsupported sitting Lower Body Bathing: Simulated;Maximal assistance (mod sit to stand) Where Assessed - Lower Body Bathing: Supported sit to stand Upper Body Dressing: Simulated;Supervision/safety;Set up Where Assessed - Upper Body Dressing: Unsupported sitting Lower Body Dressing: Simulated;Maximal assistance Where Assessed - Lower Body Dressing: Supported sit to stand Toilet Transfer: Performed;Moderate assistance Toilet Transfer Method: Other  (comment) (with walker into bathroom) Toilet Transfer Equipment: Raised toilet seat with arms (or 3-in-1 over toilet) Toileting - Clothing Manipulation and Hygiene: Simulated;Moderate assistance Where Assessed - Toileting Clothing Manipulation and Hygiene: Sit to stand from 3-in-1 or toilet Equipment Used: Rolling walker ADL Comments: Educated pt and wife on AE options as pt having trouble reaching down to L foot as well as R for LB dressing. Wife declines AE and states she will help with LB ADL. Pt unsafe with walker, tends to step too far inside of walker despite max verbal and demo cues. He also is forward flexed over walker. With cues he can stand more erect but soon begins to forward flex again. He needs multimodal cues for hand placement and guidance to reach for armrests at times. He was fatigued after transferring to toilet. Will benefit from an additional day of therapies.     OT Diagnosis: Generalized weakness  OT Problem List: Decreased strength;Decreased knowledge of use of DME or AE OT Treatment Interventions: Self-care/ADL training;DME and/or AE instruction;Therapeutic activities   OT Goals(Current goals can be found in the care plan section) Acute Rehab OT Goals Patient Stated Goal: to get stronger OT Goal Formulation: With patient Time For Goal Achievement: 12/16/12 Potential to Achieve Goals: Good ADL Goals Pt Will Transfer to Toilet: with min assist;ambulating;bedside commode Pt Will Perform Toileting - Clothing Manipulation and hygiene: with min assist;sit to/from stand Additional ADL Goal #1: Pt will maintain standing balance with min guard assist for caregiver to assist with ADLs.   Visit Information  Last OT Received On: 12/09/12 Assistance Needed: +1 History of Present Illness: pt admitted for elective RTKR 12/08/12       Prior Functioning     Home Living Family/patient expects to be discharged to:: Private residence Living Arrangements: Spouse/significant  other Available Help at  Discharge: Family Type of Home: House Home Access: Stairs to enter [employer] of Steps: 3 Entrance Stairs-Rails: Left Home Layout: One level Home Equipment: Shower seat;Grab bars - tub/shower;Hand held shower head;Walker - 2 wheels;Cane - single point Prior Function Level of Independence: Independent with assistive device(s) Comments: pt recently used a cane due to pain and swelling in knee Communication Communication: No difficulties         Vision/Perception     Cognition  Cognition Arousal/Alertness: Awake/alert Behavior During Therapy: WFL for tasks assessed/performed Overall Cognitive Status: Within Functional Limits for tasks assessed    Extremity/Trunk Assessment Upper Extremity Assessment Upper Extremity Assessment: Overall WFL for tasks assessed     Mobility Bed Mobility Supine to Sit: 3: Mod assist Details for Bed Mobility Assistance: Assist for trunk to upright and R LE off bed. Increased time. VCs safety, technique, hand placement Transfers Sit to Stand: 3: Mod assist;From bed;From elevated surface Stand to Sit: 3: Mod assist;To toilet Details for Transfer Assistance: assist to rise, stabilize, control descent. Multimodal cues for safety, technique, hand placement.         Balance Balance Balance Assessed: Yes Static Sitting Balance Static Sitting - Balance Support: Bilateral upper extremity supported;Feet supported Static Sitting - Level of Assistance: 6: Modified independent (Device/Increase time)   End of Session OT - End of Session Equipment Utilized During Treatment: Gait belt Activity Tolerance: Patient limited by fatigue Patient left: in chair;with call bell/phone within reach;with family/visitor present  GO     Lennox Laity [phone] 12/09/2012, 1:13 PM

## 2012-12-09 NOTE — Consult Note (Signed)
Spoke to patient wife and she stated that her husband has walker at home and doesn't need a 3in1 commode.

## 2012-12-09 NOTE — Progress Notes (Signed)
Physical Therapy Treatment Patient Details Name: Mark Wilkinson MRN: [id number] DOB: 1927/07/24 Today's Date: 12/09/2012 Time: 4098-1191 PT Time Calculation (min): 23 min  PT Assessment / Plan / Recommendation  PT Comments   Progressing slowly with mobility. Pt limited somewhat by pain this session. Plan is for d/c home on tomorrow.   Follow Up Recommendations  Home health PT;Supervision/Assistance - 24 hour     Does the patient have the potential to tolerate intense rehabilitation     Barriers to Discharge        Equipment Recommendations  None recommended by PT    Recommendations for Other Services OT consult  Frequency 7X/week   Progress towards PT Goals Progress towards PT goals: Progressing toward goals  Plan Current plan remains appropriate    Precautions / Restrictions Precautions Precautions: Knee;Fall Required Braces or Orthoses: Knee Immobilizer - Right Knee Immobilizer - Right: Discontinue once straight leg raise with < 10 degree lag Restrictions Weight Bearing Restrictions: No RLE Weight Bearing: Weight bearing as tolerated   Pertinent Vitals/Pain 7/10 R knee with activity. Pre-medicated prior to session but asked RN if pt could possibly have muscle relaxer as well    Mobility  Bed Mobility Bed Mobility: Sit to Supine Sit to Supine: 4: Min assist Details for Bed Mobility Assistance: Assist for R LE onto bed.  Transfers Transfers: Sit to Stand;Stand to Sit Sit to Stand: 3: Mod assist;With armrests;From chair/3-in-1 Stand to Sit: 4: Min assist;To elevated surface;To bed Details for Transfer Assistance: assist to rise, stabilize, control descent. Multimodal cues for safety, technique, hand placement.  Ambulation/Gait Ambulation/Gait Assistance: 4: Min assist Ambulation Distance (Feet): 50 Feet Assistive device: Rolling walker Ambulation/Gait Assistance Details: Assist to stabilize and maneuver with RW. Cues for posture, sequence, distance from RW. Slow gait  speed. Fatigues easily.  Gait Pattern: Trunk flexed;Step-to pattern;Decreased stride length;Antalgic;Decreased step length - right    Exercises    PT Diagnosis:    PT Problem List:   PT Treatment Interventions:     PT Goals (current goals can now be found in the care plan section) Acute Rehab PT Goals Patient Stated Goal: to get stronger  Visit Information  Last PT Received On: 12/09/12 Assistance Needed: +1 History of Present Illness: pt admitted for elective RTKR 12/08/12    Subjective Data  Patient Stated Goal: to get stronger   Cognition  Cognition Arousal/Alertness: Awake/alert Behavior During Therapy: WFL for tasks assessed/performed Overall Cognitive Status: History of cognitive impairments - at baseline    Balance  Balance Balance Assessed: Yes Static Sitting Balance Static Sitting - Balance Support: Bilateral upper extremity supported;Feet supported Static Sitting - Level of Assistance: 6: Modified independent (Device/Increase time)  End of Session PT - End of Session Equipment Utilized During Treatment: Gait belt;Right knee immobilizer Activity Tolerance: Patient limited by pain Patient left: in bed;with call bell/phone within reach;with nursing/sitter in room Nurse Communication: Patient requests pain meds (muscle relaxer)   GP     [name], MPT Pager: [phone]

## 2012-12-10 LAB — BASIC METABOLIC PANEL
BUN: 14 mg/dL (ref 6–23)
Calcium: 8.6 mg/dL (ref 8.4–10.5)
GFR calc non Af Amer: 79 mL/min — ABNORMAL LOW (ref >90)
Glucose, Bld: 128 mg/dL — ABNORMAL HIGH (ref 70–99)
Sodium: 142 mEq/L (ref 135–145)

## 2012-12-10 LAB — CBC
Hemoglobin: 11.4 g/dL — ABNORMAL LOW (ref 13.0–17.0)
MCH: 29.5 pg (ref 26.0–34.0)
MCHC: 33.6 g/dL (ref 30.0–36.0)

## 2012-12-10 NOTE — Care Management Note (Signed)
    Page 1 of 2   12/10/2012     11:31:45 AM   CARE MANAGEMENT NOTE 12/10/2012  Patient:  Mark Wilkinson, Mark Wilkinson   Account Number:  [id number]  Date Initiated:  12/10/2012  Documentation initiated by:  Lanier Clam  Subjective/Objective Assessment:   ADMITTED W/R KNEE OSTEOARTHRITIS     Action/Plan:   FROM HOME W/SPOUSE.HAS RW.   Anticipated DC Date:  12/10/2012   Anticipated DC Plan:  HOME W HOME HEALTH SERVICES      DC Planning Services  CM consult      Choice offered to / List presented to:  C-1 Patient   DME arranged  BEDSIDE COMMODE      DME agency  [facility]     HH arranged  HH-2 PT  HH-3 OT      [facility] agency  [facility]   Status of service:  Completed, signed off Medicare Important Message given?   (If response is "NO", the following Medicare IM given date fields will be blank) Date Medicare IM given:   Date Additional Medicare IM given:    Discharge Disposition:  HOME W HOME HEALTH SERVICES  Per UR Regulation:  Reviewed for med. necessity/level of care/duration of stay  If discussed at Long Length of Stay Meetings, dates discussed:    Comments:  12/10/12 Priyansh Pry RN,BSN NCM [phone] [facility] CHOSEN FOR HHPT/OT-KRISTEN REP MADE AWARE OF HH ORDERS, & D/C.Marland KitchenRW WAS ORDERED BUT NOT NEEDED.3N1 BROUGHT TO PATIENT'S RM.

## 2012-12-10 NOTE — Progress Notes (Signed)
Physical Therapy Treatment Patient Details Name: Mark Wilkinson MRN: [id number] DOB: Jul 13, 1927 Today's Date: 12/10/2012 Time: 4098-1191 PT Time Calculation (min): 46 min  PT Assessment / Plan / Recommendation  PT Comments   Progressing with mobility. Reviewed ambulation, exercises, stair negotiation. Discussed car transfer. All education completed. Ready to d/c from PT standpoint.   Follow Up Recommendations  Home health PT;Supervision/Assistance - 24 hour     Does the patient have the potential to tolerate intense rehabilitation     Barriers to Discharge        Equipment Recommendations  None recommended by PT    Recommendations for Other Services OT consult  Frequency 7X/week   Progress towards PT Goals Progress towards PT goals: Progressing toward goals  Plan Current plan remains appropriate    Precautions / Restrictions Precautions Precautions: Knee;Fall Required Braces or Orthoses: Knee Immobilizer - Right Knee Immobilizer - Right: Discontinue once straight leg raise with < 10 degree lag Restrictions Weight Bearing Restrictions: No RLE Weight Bearing: Weight bearing as tolerated   Pertinent Vitals/Pain 5/10 R knee with activity    Mobility  Bed Mobility Bed Mobility: Not assessed Transfers Transfers: Sit to Stand;Stand to Sit Sit to Stand: 3: Mod assist;From chair/3-in-1;With armrests Stand to Sit: 4: Min assist;To chair/3-in-1;With armrests Details for Transfer Assistance: assist to rise and steady.  Cues for hand and foot placement Ambulation/Gait Ambulation/Gait Assistance: 4: Min assist Ambulation Distance (Feet): 50 Feet Assistive device: Rolling walker Ambulation/Gait Assistance Details: Continues to fatigue easily. Vcs posture, sequence, distance from RW. slow gait speed Gait Pattern: Trunk flexed;Decreased stride length;Antalgic;Decreased step length - right Stairs: Yes Stairs Assistance: 4: Min assist Stairs Assistance Details (indicate cue type and  reason): VCs safety, technique, sequence. Assist to stabilize. Wife present as well.  Stair Management Technique: Step to pattern;Forwards;With crutches;One rail Left Number of Stairs: 2    Exercises Total Joint Exercises Ankle Circles/Pumps: Both;10 reps;Seated Quad Sets: AROM;Both;10 reps;Seated Heel Slides: AAROM;Right;10 reps;Seated Hip ABduction/ADduction: AAROM;Right;10 reps;Seated Straight Leg Raises: AAROM;Right;10 reps;Seated   PT Diagnosis:    PT Problem List:   PT Treatment Interventions:     PT Goals (current goals can now be found in the care plan section)    Visit Information  Last PT Received On: 12/10/12 Assistance Needed: +1 History of Present Illness: pt admitted for elective RTKR 12/08/12    Subjective Data      Cognition  Cognition Arousal/Alertness: Awake/alert Behavior During Therapy: [name] for tasks assessed/performed Overall Cognitive Status: History of cognitive impairments - at baseline    Balance     End of Session PT - End of Session Equipment Utilized During Treatment: Gait belt;Right knee immobilizer Activity Tolerance: Patient limited by fatigue Patient left: in chair;with call bell/phone within reach;with family/visitor present   GP     [name], MPT Pager: [phone]

## 2012-12-10 NOTE — Progress Notes (Signed)
   Subjective: 2 Days Post-Op Procedure(s) (LRB): RIGHT TOTAL KNEE ARTHROPLASTY (Right)   Patient reports pain as mild, pain well controlled. No events throughout the night. Ready to be discharged home.   Objective:   VITALS:   Filed Vitals:   12/10/12 1400  BP: 131/70  Pulse: 79  Temp: 98 F (36.7 C)  Resp: 18    Neurovascular intact Dorsiflexion/Plantar flexion intact Incision: dressing C/D/I No cellulitis present Compartment soft  LABS  Recent Labs  12/09/12 0443 12/10/12 0520  HGB 11.4* 11.4*  HCT 33.0* 33.9*  WBC 12.0* 10.3  PLT 141* 129*     Recent Labs  12/09/12 0443 12/10/12 0520  NA 142 142  K 4.2 3.7  BUN 19 14  CREATININE 0.85 0.84  GLUCOSE 160* 128*     Assessment/Plan: 2 Days Post-Op Procedure(s) (LRB): RIGHT TOTAL KNEE ARTHROPLASTY (Right) Up with therapy Discharge home with home health Follow up in 2 weeks at [facility]. Follow up with OLIN,Rudransh Bellanca D in 2 weeks.  Contact information:  [facility] [address] [place] [address] [phone]    Expected ABLA  Treated with iron and will observe   Obese (BMI 30-39.9)  Estimated body mass index is 30.13 kg/(m^2) as calculated from the following:      Height as of this encounter: 5\' 10"  (1.778 m).      Weight as of this encounter: 95.255 kg (210 lb).  Patient also counseled that weight may inhibit the healing process  Patient counseled that losing weight will help with future health issues       [name]. Asami Lambright   PAC  12/10/2012, 2:22 PM

## 2012-12-10 NOTE — Progress Notes (Signed)
Patient d/c'd to home. Discharge instructions given in presence of wife. Dressing clean, dry & intact to rt knee. Wife relayed she is to leave dressing on until f/up office visit with surgeon. Pain denied. Has crutches & bedside commode in room for home use. Has walker at home.

## 2012-12-10 NOTE — Progress Notes (Signed)
Occupational Therapy Treatment Patient Details Name: Mark Wilkinson MRN: [id number] DOB: Aug 13, 1927 Today's Date: 12/10/2012 Time: 4098-1191 OT Time Calculation (min): 24 min  OT Assessment / Plan / Recommendation  OT comments  Pt continues to make gains in OT.  Much safer today than upon eval, given cues.  Involved wife in tx today.  Pt continues to need cues for moving walker far enough to accommodate step length.   Follow Up Recommendations  Home health OT;Supervision/Assistance - 24 hour    Barriers to Discharge       Equipment Recommendations  3 in 1 bedside comode    Recommendations for Other Services    Frequency Min 2X/week   Progress towards OT Goals Progress towards OT goals: Progressing toward goals  Plan      Precautions / Restrictions Precautions Precautions: Knee;Fall Knee Immobilizer - Right: Discontinue once straight leg raise with < 10 degree lag Restrictions Weight Bearing Restrictions: No RLE Weight Bearing: Weight bearing as tolerated   Pertinent Vitals/Pain Pt c/o r knee pain but did not rate.  Repositioned then PT came to see him    ADL  Lower Body Dressing: Performed;Maximal assistance Where Assessed - Lower Body Dressing: Supported sit to stand Toilet Transfer: Performed;Minimal assistance Toilet Transfer Method: Sit to stand Toilet Transfer Equipment: Raised toilet seat with arms (or 3-in-1 over toilet) Equipment Used: Rolling walker Transfers/Ambulation Related to ADLs: ambulated to bathroom with cues for sit to stand and sequence.  Gave wife handout with steps so she can cue him.  She stood at commode when he stood up. ADL Comments: Wife assisted with KI--min A to don.      OT Diagnosis:    OT Problem List:   OT Treatment Interventions:     OT Goals(current goals can now be found in the care plan section)    Visit Information  Last OT Received On: 12/10/12 Assistance Needed: +1    Subjective Data      Prior Functioning        Cognition  Cognition Arousal/Alertness: Awake/alert Behavior During Therapy: WFL for tasks assessed/performed Overall Cognitive Status: History of cognitive impairments - at baseline    Mobility  Transfers Sit to Stand: 4: Min assist;From bed;From chair/3-in-1;With upper extremity assist;With armrests Details for Transfer Assistance: assist to rise and steady.  Cues for hand and foot placement    Exercises      Balance     End of Session OT - End of Session Activity Tolerance: Patient tolerated treatment well Patient left: in chair;with call bell/phone within reach;with family/visitor present  GO     Mark Wilkinson 12/10/2012, 9:57 AM Marica Otter, OTR/L [phone] 12/10/2012

## 2012-12-11 ENCOUNTER — Emergency Department (HOSPITAL_COMMUNITY): Payer: Medicare Other

## 2012-12-11 ENCOUNTER — Observation Stay (HOSPITAL_COMMUNITY)
Admission: EM | Admit: 2012-12-11 | Discharge: 2012-12-12 | Disposition: A | Discharge status: Home / Self Care | Payer: Medicare Other | Attending: Internal Medicine | Admitting: Internal Medicine

## 2012-12-11 ENCOUNTER — Encounter (HOSPITAL_COMMUNITY): Payer: Self-pay | Admitting: *Deleted

## 2012-12-11 DIAGNOSIS — R55 Syncope and collapse: Secondary | ICD-10-CM | POA: Diagnosis not present

## 2012-12-11 DIAGNOSIS — E785 Hyperlipidemia, unspecified: Secondary | ICD-10-CM

## 2012-12-11 DIAGNOSIS — G9340 Encephalopathy, unspecified: Secondary | ICD-10-CM

## 2012-12-11 DIAGNOSIS — Z79899 Other long term (current) drug therapy: Secondary | ICD-10-CM | POA: Diagnosis not present

## 2012-12-11 DIAGNOSIS — R7989 Other specified abnormal findings of blood chemistry: Secondary | ICD-10-CM

## 2012-12-11 DIAGNOSIS — R748 Abnormal levels of other serum enzymes: Secondary | ICD-10-CM | POA: Diagnosis not present

## 2012-12-11 DIAGNOSIS — R0602 Shortness of breath: Secondary | ICD-10-CM | POA: Diagnosis not present

## 2012-12-11 DIAGNOSIS — D500 Iron deficiency anemia secondary to blood loss (chronic): Secondary | ICD-10-CM

## 2012-12-11 DIAGNOSIS — Z96659 Presence of unspecified artificial knee joint: Secondary | ICD-10-CM | POA: Insufficient documentation

## 2012-12-11 DIAGNOSIS — M109 Gout, unspecified: Secondary | ICD-10-CM

## 2012-12-11 DIAGNOSIS — Z7982 Long term (current) use of aspirin: Secondary | ICD-10-CM | POA: Diagnosis not present

## 2012-12-11 DIAGNOSIS — N200 Calculus of kidney: Secondary | ICD-10-CM

## 2012-12-11 DIAGNOSIS — I10 Essential (primary) hypertension: Secondary | ICD-10-CM | POA: Diagnosis not present

## 2012-12-11 DIAGNOSIS — R031 Nonspecific low blood-pressure reading: Secondary | ICD-10-CM | POA: Diagnosis not present

## 2012-12-11 DIAGNOSIS — K449 Diaphragmatic hernia without obstruction or gangrene: Secondary | ICD-10-CM

## 2012-12-11 DIAGNOSIS — R6889 Other general symptoms and signs: Secondary | ICD-10-CM | POA: Diagnosis not present

## 2012-12-11 DIAGNOSIS — Z01810 Encounter for preprocedural cardiovascular examination: Secondary | ICD-10-CM

## 2012-12-11 DIAGNOSIS — I959 Hypotension, unspecified: Secondary | ICD-10-CM

## 2012-12-11 DIAGNOSIS — M545 Low back pain: Secondary | ICD-10-CM

## 2012-12-11 DIAGNOSIS — E669 Obesity, unspecified: Secondary | ICD-10-CM

## 2012-12-11 HISTORY — DX: Other specified postprocedural states: R11.2

## 2012-12-11 HISTORY — DX: Nausea with vomiting, unspecified: Z98.890

## 2012-12-11 LAB — CBC WITH DIFFERENTIAL/PLATELET
Basophils Absolute: 0 10*3/uL (ref 0.0–0.1)
Eosinophils Absolute: 0.1 10*3/uL (ref 0.0–0.7)
Lymphocytes Relative: 16 % (ref 12–46)
MCH: 29.3 pg (ref 26.0–34.0)
MCHC: 33.7 g/dL (ref 30.0–36.0)
Monocytes Absolute: 0.7 10*3/uL (ref 0.1–1.0)
Neutro Abs: 5.7 10*3/uL (ref 1.7–7.7)
Neutrophils Relative %: 74 % (ref 43–77)
RDW: 12.7 % (ref 11.5–15.5)

## 2012-12-11 LAB — COMPREHENSIVE METABOLIC PANEL
Albumin: 2 g/dL — ABNORMAL LOW (ref 3.5–5.2)
Alkaline Phosphatase: 48 U/L (ref 39–117)
BUN: 11 mg/dL (ref 6–23)
Calcium: 8 mg/dL — ABNORMAL LOW (ref 8.4–10.5)
GFR calc Af Amer: 88 mL/min — ABNORMAL LOW (ref >90)
Potassium: 3.4 mEq/L — ABNORMAL LOW (ref 3.5–5.1)
Sodium: 139 mEq/L (ref 135–145)
Total Protein: 5 g/dL — ABNORMAL LOW (ref 6.0–8.3)

## 2012-12-11 LAB — URINALYSIS, ROUTINE W REFLEX MICROSCOPIC
Glucose, UA: NEGATIVE mg/dL
Ketones, ur: NEGATIVE mg/dL
Leukocytes, UA: NEGATIVE
Nitrite: NEGATIVE
Specific Gravity, Urine: 1.021 (ref 1.005–1.030)
pH: 6.5 (ref 5.0–8.0)

## 2012-12-11 LAB — POCT I-STAT TROPONIN I: Troponin i, poc: 0.1 ng/mL (ref 0.00–0.08)

## 2012-12-11 LAB — TROPONIN I: Troponin I: <0.3 ng/mL (ref <0.30)

## 2012-12-11 LAB — CG4 I-STAT (LACTIC ACID): Lactic Acid, Venous: 1.41 mmol/L (ref 0.5–2.2)

## 2012-12-11 MED ORDER — VITAMIN D3 25 MCG (1000 UNIT) PO TABS
2000.0000 [IU] | ORAL_TABLET | Freq: Every day | ORAL | Status: DC
  Administered 2012-12-11 – 2012-12-12 (×2): 2000 [IU] via ORAL
  Filled 2012-12-11 (×2): qty 2

## 2012-12-11 MED ORDER — TIZANIDINE HCL 4 MG PO TABS
4.0000 mg | ORAL_TABLET | Freq: Three times a day (TID) | ORAL | Status: DC | PRN
  Filled 2012-12-11: qty 1

## 2012-12-11 MED ORDER — DOCUSATE SODIUM 100 MG PO CAPS
100.0000 mg | ORAL_CAPSULE | Freq: Two times a day (BID) | ORAL | Status: DC | PRN
  Filled 2012-12-11: qty 1

## 2012-12-11 MED ORDER — ACETAMINOPHEN 325 MG PO TABS: 650.0000 mg | ORAL_TABLET | Freq: Four times a day (QID) | ORAL | Status: DC | PRN

## 2012-12-11 MED ORDER — FERROUS SULFATE 325 (65 FE) MG PO TABS
325.0000 mg | ORAL_TABLET | Freq: Three times a day (TID) | ORAL | Status: DC
  Administered 2012-12-11 – 2012-12-12 (×2): 325 mg via ORAL
  Filled 2012-12-11 (×5): qty 1

## 2012-12-11 MED ORDER — SODIUM CHLORIDE 0.9 % IJ SOLN: 3.0000 mL | Freq: Two times a day (BID) | INTRAMUSCULAR | Status: DC

## 2012-12-11 MED ORDER — SODIUM CHLORIDE 0.9 % IV SOLN: 250.0000 mL | INTRAVENOUS | Status: DC | PRN

## 2012-12-11 MED ORDER — SODIUM CHLORIDE 0.9 % IJ SOLN: 3.0000 mL | INTRAMUSCULAR | Status: DC | PRN

## 2012-12-11 MED ORDER — ATORVASTATIN CALCIUM 40 MG PO TABS
40.0000 mg | ORAL_TABLET | Freq: Every evening | ORAL | Status: DC
  Administered 2012-12-11: 40 mg via ORAL
  Filled 2012-12-11 (×2): qty 1

## 2012-12-11 MED ORDER — PANTOPRAZOLE SODIUM 40 MG PO TBEC
40.0000 mg | DELAYED_RELEASE_TABLET | Freq: Every day | ORAL | Status: DC
  Administered 2012-12-12: 40 mg via ORAL
  Filled 2012-12-11: qty 1

## 2012-12-11 MED ORDER — ONDANSETRON HCL 4 MG PO TABS: 4.0000 mg | ORAL_TABLET | Freq: Four times a day (QID) | ORAL | Status: DC | PRN

## 2012-12-11 MED ORDER — TIMOLOL MALEATE 0.5 % OP SOLN
1.0000 [drp] | Freq: Two times a day (BID) | OPHTHALMIC | Status: DC
  Administered 2012-12-11 – 2012-12-12 (×2): 1 [drp] via OPHTHALMIC
  Filled 2012-12-11: qty 5

## 2012-12-11 MED ORDER — ADULT MULTIVITAMIN W/MINERALS CH
1.0000 | ORAL_TABLET | Freq: Every day | ORAL | Status: DC
  Administered 2012-12-12: 1 via ORAL
  Filled 2012-12-11 (×2): qty 1

## 2012-12-11 MED ORDER — ASPIRIN EC 325 MG PO TBEC
325.0000 mg | DELAYED_RELEASE_TABLET | Freq: Two times a day (BID) | ORAL | Status: DC
  Administered 2012-12-11 – 2012-12-12 (×2): 325 mg via ORAL
  Filled 2012-12-11 (×4): qty 1

## 2012-12-11 MED ORDER — ONDANSETRON HCL 4 MG/2ML IJ SOLN: 4.0000 mg | Freq: Four times a day (QID) | INTRAMUSCULAR | Status: DC | PRN

## 2012-12-11 MED ORDER — SODIUM CHLORIDE 0.9 % IV BOLUS (SEPSIS)
1000.0000 mL | Freq: Once | INTRAVENOUS | Status: AC
  Administered 2012-12-11: 1000 mL via INTRAVENOUS

## 2012-12-11 MED ORDER — ALLOPURINOL 300 MG PO TABS
300.0000 mg | ORAL_TABLET | Freq: Every day | ORAL | Status: DC
  Administered 2012-12-12: 300 mg via ORAL
  Filled 2012-12-11: qty 1

## 2012-12-11 MED ORDER — NIACIN ER (ANTIHYPERLIPIDEMIC) 500 MG PO TBCR
1000.0000 mg | EXTENDED_RELEASE_TABLET | Freq: Every day | ORAL | Status: DC
  Administered 2012-12-11: 1000 mg via ORAL
  Filled 2012-12-11 (×2): qty 2

## 2012-12-11 MED ORDER — ENOXAPARIN SODIUM 60 MG/0.6ML ~~LOC~~ SOLN
50.0000 mg | SUBCUTANEOUS | Status: DC
  Administered 2012-12-11: 50 mg via SUBCUTANEOUS
  Filled 2012-12-11 (×2): qty 0.6

## 2012-12-11 MED ORDER — HYDROCODONE-ACETAMINOPHEN 7.5-325 MG PO TABS: 1.0000 | ORAL_TABLET | ORAL | Status: DC | PRN

## 2012-12-11 MED ORDER — SENNOSIDES-DOCUSATE SODIUM 8.6-50 MG PO TABS
1.0000 | ORAL_TABLET | Freq: Every evening | ORAL | Status: DC | PRN
  Filled 2012-12-11: qty 1

## 2012-12-11 MED ORDER — ACETAMINOPHEN 650 MG RE SUPP: 650.0000 mg | Freq: Four times a day (QID) | RECTAL | Status: DC | PRN

## 2012-12-11 MED ORDER — ENOXAPARIN SODIUM 40 MG/0.4ML ~~LOC~~ SOLN
40.0000 mg | SUBCUTANEOUS | Status: DC
Start: 2012-12-11 — End: 2012-12-11

## 2012-12-11 NOTE — H&P (Signed)
[facility]          History and Physical    PCP:   Rudi Heap, MD   Chief Complaint:  Confusion, low blood pressure  HPI: Patient is a pleasant 79 year old man with past medical history significant for hypertension, who was recently discharged from the hospital yesterday after he had a right total knee replacement. This morning his wife administered his blood pressure medications as scheduled, but an hour later he complained of pain in his knees so she proceeded to give him a Vicodin as well as a muscle relaxer. Noticed that over the next hour and a half he started to become very lethargic which is not his normal self. The home health physical therapist came to start a rehabilitation program for his knee and when she checked his vital signs his blood pressure was noted to be in the 80s and he was sent to the hospital for further evaluation. His blood pressure has responded quickly to some IV fluids given in the emergency department. His lab work is essentially unremarkable with the exception of a slightly elevated troponin of 0.10. We have been asked to admit him for further evaluation and management.  Allergies:   Allergies  Allergen Reactions  . Penicillins Other (See Comments)    Unknown       Past Medical History  Diagnosis Date  . Essential hypertension, benign   . Other and unspecified hyperlipidemia   . Diaphragmatic hernia without mention of obstruction or gangrene   . Gout, unspecified   . Calculus of kidney   . Asymptomatic varicose veins   . Lumbago   . Arthritis   . PONV (postoperative nausea and vomiting)     Past Surgical History  Procedure Laterality Date  . Appendectomy    . Tonsilectomy, adenoidectomy, bilateral myringotomy and tubes    . Cholecystectomy    . Cystoscopy with retrograde pyelogram, ureteroscopy and stent placement Left 08/13/2012    Procedure: CYSTOSCOPY WITH RETROGRADE PYELOGRAM, URETEROSCOPY AND STENT PLACEMENT;   Surgeon: Sebastian Ache, MD;  Location: [facility];  Service: Urology;  Laterality: Left;  . Tonsillectomy    . Eye surgery      left cataract with lens implant  . Total knee arthroplasty Right 12/08/2012    Procedure: RIGHT TOTAL KNEE ARTHROPLASTY;  Surgeon: Shelda Pal, MD;  Location: [facility];  Service: Orthopedics;  Laterality: Right;    Prior to Admission medications   Medication Sig Start Date End Date Taking? Authorizing Provider  allopurinol (ZYLOPRIM) 300 MG tablet Take 300 mg by mouth daily.   Yes Historical Provider, MD  aspirin EC 325 MG EC tablet Take 1 tablet (325 mg total) by mouth 2 (two) times daily. 12/09/12 01/06/13 Yes Genelle Gather Babish, PA-C  atenolol (TENORMIN) 50 MG tablet Take 25-50 mg by mouth 2 (two) times daily. 25 mg every morning and 50 mg every evening   Yes Historical Provider, MD  atorvastatin (LIPITOR) 40 MG tablet Take 40 mg by mouth every evening.    Yes Historical Provider, MD  B Complex-C (SUPER B COMPLEX PO) Take 1 tablet by mouth daily.   Yes Historical Provider, MD  Cholecalciferol (VITAMIN D3) 1000 UNITS CAPS Take 2,000 Units by mouth daily.    Yes Historical Provider, MD  docusate sodium (COLACE) 100 MG capsule Take 100 mg by mouth 2 (two) times daily as needed for constipation.   Yes Historical Provider, MD  esomeprazole (NEXIUM) 40 MG capsule Take 40 mg by  mouth daily before breakfast.     Yes Historical Provider, MD  ferrous sulfate 325 (65 FE) MG tablet Take 1 tablet (325 mg total) by mouth 3 (three) times daily after meals. 12/09/12  Yes Genelle Gather Babish, PA-C  GLUCOSAMINE PO Take 1 tablet by mouth 2 (two) times daily.   Yes Historical Provider, MD  HYDROcodone-acetaminophen (NORCO) 7.5-325 MG per tablet Take 1-2 tablets by mouth every 4 (four) hours as needed for pain. 12/09/12  Yes Genelle Gather Babish, PA-C  Multiple Vitamin (MULTIVITAMIN WITH MINERALS) TABS Take 1 tablet by mouth daily.   Yes Historical Provider, MD  niacin  (NIASPAN) 500 MG CR tablet Take 1,000 mg by mouth at bedtime.   Yes Historical Provider, MD  Omega-3 Fatty Acids (FISH OIL) 1000 MG CAPS Take 1,000 mg by mouth 2 (two) times daily.    Yes Historical Provider, MD  quinapril (ACCUPRIL) 10 MG tablet Take 10 mg by mouth every morning.   Yes Historical Provider, MD  Ranibizumab (LUCENTIS) 0.3 MG/0.05ML SOLN Place 1 application into the left eye. Injection q 6-8 weeks   Yes Historical Provider, MD  timolol (BETIMOL) 0.5 % ophthalmic solution Place 1 drop into the left eye 2 (two) times daily.   Yes Historical Provider, MD  tiZANidine (ZANAFLEX) 4 MG capsule Take 1 capsule (4 mg total) by mouth 3 (three) times daily as needed for muscle spasms. 12/09/12  Yes Genelle Gather Babish, PA-C    Social History:  reports that he quit smoking about 52 years ago. His smoking use included Cigarettes. He smoked 0.00 packs per day. He has never used smokeless tobacco. He reports that he does not drink alcohol or use illicit drugs.  Family History  Problem Relation Age of Onset  . Heart disease Mother     Died in her 79s  . Hypertension Mother   . Stroke Father   . CAD Brother     MI 93s    Review of Systems:  Constitutional: Denies fever, chills, diaphoresis, appetite change and fatigue.  HEENT: Denies photophobia, eye pain, redness, hearing loss, ear pain, congestion, sore throat, rhinorrhea, sneezing, mouth sores, trouble swallowing, neck pain, neck stiffness and tinnitus.   Respiratory: Denies SOB, DOE, cough, chest tightness,  and wheezing.   Cardiovascular: Denies chest pain, palpitations and leg swelling.  Gastrointestinal: Denies nausea, vomiting, abdominal pain, diarrhea, constipation, blood in stool and abdominal distention.  Genitourinary: Denies dysuria, urgency, frequency, hematuria, flank pain and difficulty urinating.  Endocrine: Denies: hot or cold intolerance, sweats, changes in hair or nails, polyuria, polydipsia. Musculoskeletal: Denies  myalgias, back pain, joint swelling, arthralgias and gait problem.  Skin: Denies pallor, rash and wound.  Neurological: Denies dizziness, seizures, syncope, weakness, light-headedness, numbness and headaches.  Hematological: Denies adenopathy. Easy bruising, personal or family bleeding history  Psychiatric/Behavioral: Denies suicidal ideation, mood changes, confusion, nervousness, sleep disturbance and agitation   Physical Exam: Blood pressure 126/64, pulse 77, temperature 98.1 F (36.7 C), temperature source Oral, resp. rate 20, height 5\' 10"  (1.778 m), weight 99.338 kg (219 lb), SpO2 98.00%. General: Alert, awake, oriented x3, in no distress. HEENT: Normocephalic, atraumatic, pupils equal round and reactive to light, extraocular movements intact. Neck: Supple, no JVD, no lymphadenopathy, no bruits, no goiter. Cardiovascular: Regular rate and rhythm, no murmurs, rubs gallops. Lungs: Clear to auscultation bilaterally. Abdomen: Soft, nontender, nondistended, positive bowel sounds, no masses organomegaly noted. Extremities: Right is in an immobilizer, no clubbing, cyanosis or edema, positive pedal pulses bilaterally. Neurologic: Grossly intact and  nonfocal. I have not ambulated him.  Labs on Admission:  Results for orders placed during the hospital encounter of 12/11/12 (from the past 48 hour(s))  CBC WITH DIFFERENTIAL     Status: Abnormal   Collection Time    12/11/12 12:44 PM      Result Value Range   WBC 7.7  4.0 - 10.5 K/uL   RBC 3.31 (*) 4.22 - 5.81 MIL/uL   Hemoglobin 9.7 (*) 13.0 - 17.0 g/dL   HCT 16.1 (*) 09.6 - 04.5 %   MCV 87.0  78.0 - 100.0 fL   MCH 29.3  26.0 - 34.0 pg   MCHC 33.7  30.0 - 36.0 g/dL   RDW 40.9  81.1 - 91.4 %   Platelets 103 (*) 150 - 400 K/uL   Comment: SPECIMEN CHECKED FOR CLOTS     REPEATED TO VERIFY     PLATELET COUNT CONFIRMED BY SMEAR   Neutrophils Relative % 74  43 - 77 %   Lymphocytes Relative 16  12 - 46 %   Monocytes Relative 9  3 - 12 %    Eosinophils Relative 1  0 - 5 %   Basophils Relative 0  0 - 1 %   Neutro Abs 5.7  1.7 - 7.7 K/uL   Lymphs Abs 1.2  0.7 - 4.0 K/uL   Monocytes Absolute 0.7  0.1 - 1.0 K/uL   Eosinophils Absolute 0.1  0.0 - 0.7 K/uL   Basophils Absolute 0.0  0.0 - 0.1 K/uL   Smear Review PLATELET COUNT CONFIRMED BY SMEAR    COMPREHENSIVE METABOLIC PANEL     Status: Abnormal   Collection Time    12/11/12 12:44 PM      Result Value Range   Sodium 139  135 - 145 mEq/L   Potassium 3.4 (*) 3.5 - 5.1 mEq/L   Chloride 105  96 - 112 mEq/L   CO2 27  19 - 32 mEq/L   Glucose, Bld 158 (*) 70 - 99 mg/dL   BUN 11  6 - 23 mg/dL   Creatinine, Ser 7.82  0.50 - 1.35 mg/dL   Calcium 8.0 (*) 8.4 - 10.5 mg/dL   Total Protein 5.0 (*) 6.0 - 8.3 g/dL   Albumin 2.0 (*) 3.5 - 5.2 g/dL   AST 17  0 - 37 U/L   ALT 9  0 - 53 U/L   Alkaline Phosphatase 48  39 - 117 U/L   Total Bilirubin 0.5  0.3 - 1.2 mg/dL   GFR calc non Af Amer 76 (*) >90 mL/min   GFR calc Af Amer 88 (*) >90 mL/min   Comment:            The eGFR has been calculated     using the CKD EPI equation.     This calculation has not been     validated in all clinical     situations.     eGFR's persistently     <90 mL/min signify     possible Chronic Kidney Disease.  POCT I-STAT TROPONIN I     Status: Abnormal   Collection Time    12/11/12 12:49 PM      Result Value Range   Troponin i, poc 0.10 (*) 0.00 - 0.08 ng/mL   Comment NOTIFIED PHYSICIAN     Comment 3            Comment: Due to the release kinetics of cTnI,     a negative result within  the first hours     of the onset of symptoms does not rule out     myocardial infarction with certainty.     If myocardial infarction is still suspected,     repeat the test at appropriate intervals.  CG4 I-STAT (LACTIC ACID)     Status: None   Collection Time    12/11/12 12:52 PM      Result Value Range   Lactic Acid, Venous 1.41  0.5 - 2.2 mmol/L  URINALYSIS, ROUTINE W REFLEX MICROSCOPIC     Status: None    Collection Time    12/11/12  1:57 PM      Result Value Range   Color, Urine YELLOW  YELLOW   APPearance CLEAR  CLEAR   Specific Gravity, Urine 1.021  1.005 - 1.030   pH 6.5  5.0 - 8.0   Glucose, UA NEGATIVE  NEGATIVE mg/dL   Hgb urine dipstick NEGATIVE  NEGATIVE   Bilirubin Urine NEGATIVE  NEGATIVE   Ketones, ur NEGATIVE  NEGATIVE mg/dL   Protein, ur NEGATIVE  NEGATIVE mg/dL   Urobilinogen, UA 1.0  0.0 - 1.0 mg/dL   Nitrite NEGATIVE  NEGATIVE   Leukocytes, UA NEGATIVE  NEGATIVE   Comment: MICROSCOPIC NOT DONE ON URINES WITH NEGATIVE PROTEIN, BLOOD, LEUKOCYTES, NITRITE, OR GLUCOSE <1000 mg/dL.    Radiological Exams on Admission: Dg Chest 2 View  12/11/2012   *RADIOLOGY REPORT*  Clinical Data: Shortness of breath and hypotension  CHEST - 2 VIEW  Comparison: 07/24/2012  Findings: Stable asymmetric elevation of the right hemidiaphragm. No edema or focal airspace consolidation.  Bibasilar chronic atelectasis or scarring is unchanged. The cardiopericardial silhouette is within normal limits for size. Telemetry leads overlie the chest.  IMPRESSION: Stable.  No new or acute interval findings.   Original Report Authenticated By: Kennith Center, M.D.    Assessment/Plan Principal Problem:   Acute encephalopathy Active Problems:   Essential hypertension, benign   S/P right TKA   Hypotension, unspecified   Elevated troponin   Acute encephalopathy -Completely resolved. -Suspect related to transient hypotension. Please see below for details. No further workup.  Transient hypotension -Suspect related to medications as he received blood pressure medications on top of muscle relaxer and Vicodin. -Blood pressure has responded swiftly to IV fluids. -We'll continue to hold blood pressure medications at this point.  Elevated troponin -Patient denies chest pain, shortness of breath, palpitations, dizziness, lightheadedness. -Suspect is probably related to transient hypotension. -No acute ischemic  abnormalities on EKG. -Will order 2-D echo to assess ejection fraction and will cycle troponins. -Consider cardiology consultation if decreased ejection fraction or if troponin continues to rise.   DVT prophylaxis -Lovenox.  Time Spent on Admission: 80 minutes  HERNANDEZ ACOSTA,ESTELA [facility] Pager: [phone] 12/11/2012, 4:54 PM

## 2012-12-11 NOTE — ED Provider Notes (Addendum)
History    CSN: [id number] Arrival date & time 12/11/12  1200  First MD Initiated Contact with Patient 12/11/12 1221     Chief Complaint  Patient presents with  . hypotensive    (Consider location/radiation/quality/duration/timing/severity/associated sxs/prior Treatment) HPI Patient presents after an episode of unresponsiveness. History of present illness is per the patient's wife. Patient does not recall the entirety of the event. Patient's wife states that this morning, proximally one hour prior to emergency Department arrival the patient was unresponsive, minimally interactive. Prior to this, the patient had taken his daily antihypertensives, and subsequently taking narcotics for pain control. Prior to the episode the patient seemed to be doing well in recovery from recent replacement of right knee. No report of recent fever, chills, vomiting, diarrhea, dysuria, hematuria. According to the wife the patient was only responsive/unresponsive for some time, and when home health arrived, the patient's blood pressure was obtained, found to be low, and he was sent here for evaluation. The patient does not recall any of those events. On my evaluation he states he feels well, has no new pain, no focal complaints.   Past Medical History  Diagnosis Date  . Essential hypertension, benign   . Other and unspecified hyperlipidemia   . Diaphragmatic hernia without mention of obstruction or gangrene   . Gout, unspecified   . Calculus of kidney   . Asymptomatic varicose veins   . Lumbago   . Arthritis    Past Surgical History  Procedure Laterality Date  . Appendectomy    . Tonsilectomy, adenoidectomy, bilateral myringotomy and tubes    . Cholecystectomy    . Cystoscopy with retrograde pyelogram, ureteroscopy and stent placement Left 08/13/2012    Procedure: CYSTOSCOPY WITH RETROGRADE PYELOGRAM, URETEROSCOPY AND STENT PLACEMENT;  Surgeon: Sebastian Ache, MD;  Location: [facility];  Service: Urology;  Laterality: Left;  . Tonsillectomy    . Eye surgery      left cataract with lens implant  . Total knee arthroplasty Right 12/08/2012    Procedure: RIGHT TOTAL KNEE ARTHROPLASTY;  Surgeon: Shelda Pal, MD;  Location: [facility];  Service: Orthopedics;  Laterality: Right;   Family History  Problem Relation Age of Onset  . Heart disease Mother     Died in her 83s  . Hypertension Mother   . Stroke Father   . CAD Brother     MI 16s   History  Substance Use Topics  . Smoking status: Former Smoker    Types: Cigarettes  . Smokeless tobacco: Not on file     Comment: Light smoker.  Quit 1962  . Alcohol Use: No    Review of Systems  Constitutional:       Per HPI, otherwise negative  HENT:       Per HPI, otherwise negative  Respiratory:       Per HPI, otherwise negative  Cardiovascular:       Per HPI, otherwise negative  Gastrointestinal: Negative for vomiting.  Endocrine:       Negative aside from HPI  Genitourinary: Positive for frequency.  Musculoskeletal:       Knee replacement was 4 days ago, thought to be uncomplicated   Skin: Negative.   Neurological: Positive for weakness and light-headedness. Negative for syncope, facial asymmetry and speech difficulty.    Allergies  Penicillins  Home Medications   Current Outpatient Rx  Name  Route  Sig  Dispense  Refill  . allopurinol (ZYLOPRIM) 300 MG tablet  Oral   Take 300 mg by mouth daily.         Marland Kitchen aspirin EC 325 MG EC tablet   Oral   Take 1 tablet (325 mg total) by mouth 2 (two) times daily.   60 tablet   0     Take for 4 weeks.   Marland Kitchen atenolol (TENORMIN) 50 MG tablet   Oral   Take 25-50 mg by mouth 2 (two) times daily. 25 mg every morning and 50 mg every evening         . atorvastatin (LIPITOR) 40 MG tablet   Oral   Take 40 mg by mouth every evening.          . B Complex-C (SUPER B COMPLEX PO)   Oral   Take 1 tablet by mouth daily.         . Cholecalciferol (VITAMIN D3)  1000 UNITS CAPS   Oral   Take 2,000 Units by mouth daily.          Marland Kitchen esomeprazole (NEXIUM) 40 MG capsule   Oral   Take 40 mg by mouth daily before breakfast.           . ferrous sulfate 325 (65 FE) MG tablet   Oral   Take 1 tablet (325 mg total) by mouth 3 (three) times daily after meals.      3   . GLUCOSAMINE PO   Oral   Take 1 tablet by mouth 2 (two) times daily.         Marland Kitchen HYDROcodone-acetaminophen (NORCO) 7.5-325 MG per tablet   Oral   Take 1-2 tablets by mouth every 4 (four) hours as needed for pain.   100 tablet   0   . Multiple Vitamin (MULTIVITAMIN WITH MINERALS) TABS   Oral   Take 1 tablet by mouth daily.         . niacin (NIASPAN) 500 MG CR tablet      TAKE 3 TABLETS BY MOUTH AT BEDTIME   270 tablet   0   . Omega-3 Fatty Acids (FISH OIL) 1000 MG CAPS   Oral   Take 1,000 mg by mouth 2 (two) times daily.          . quinapril (ACCUPRIL) 10 MG tablet   Oral   Take 10 mg by mouth every morning.         . Ranibizumab (LUCENTIS) 0.3 MG/0.05ML SOLN   Intraocular   Inject 1 application into the eye. Injection q 6-8 weeks         . timolol (BETIMOL) 0.5 % ophthalmic solution   Left Eye   Place 1 drop into the left eye 2 (two) times daily.         Marland Kitchen tiZANidine (ZANAFLEX) 4 MG capsule   Oral   Take 1 capsule (4 mg total) by mouth 3 (three) times daily as needed for muscle spasms.   50 capsule   0    BP 108/54  Pulse 71  Temp(Src) 98 F (36.7 C) (Oral)  Resp 16  SpO2 95% Physical Exam  Nursing note and vitals reviewed. Constitutional: He is oriented to person, place, and time. He appears well-developed. No distress.  Initially the patient is in Trendelenburg position given his hypotension.  When changed to supine positioning he remains awake alert.  HENT:  Head: Normocephalic and atraumatic.  Eyes: Conjunctivae and EOM are normal.  Cardiovascular: Normal rate and regular rhythm.   Pulmonary/Chest: Effort normal. No stridor. No  respiratory distress.  Abdominal: He exhibits no distension.  Musculoskeletal: He exhibits no edema.  The right knee has postsurgical bandage.  The areas around the drainage, anteriorly have no swelling erythema, no discharge, no joint.  There is no warmth. Patient moves the distal extremity appropriately.  Neurological: He is alert and oriented to person, place, and time.  Skin: Skin is warm and dry.  Psychiatric: He has a normal mood and affect.    ED Course  Procedures (including critical care time) Labs Reviewed  CBC WITH DIFFERENTIAL  COMPREHENSIVE METABOLIC PANEL  URINALYSIS, ROUTINE W REFLEX MICROSCOPIC   No results found. No diagnosis found. Patient initially hypotensive, 100/50. Initial troponin is positive.  Patient is also mildly anemic  I reviewed the patient's chart, including recent procedure.  No recent echocardiogram  Update: Patient awake and alert, vital signs appear stable.  Cardiac 60 sinus rhythm normal Pulse ox 99% with nasal cannula this is abnormal EKG has sinus rhythm, rate 68, nonspecific T wave changes, and premature atrial contractions.  Abnormal.   MDM  Patient presents after a episode of unresponsiveness/syncope.  With the patient's concurrent use of beta blockers and narcotics, there is some suspicion for iatrogenic syncope/unresponsiveness, though with his hypotension persisted to arrival here, the elevated troponin, admission for further evaluation and management as indicated.  On repeat exam the patient is awake and alert, improved substantially.  However, as above, he has no recent cardiac history, and was admitted for further evaluation and management.  Gerhard Munch, MD 12/11/12 1515  Gerhard Munch, MD 12/11/12 [phone]

## 2012-12-11 NOTE — Care Management Note (Addendum)
    Page 1 of 2   12/13/2012     5:03:54 PM   CARE MANAGEMENT NOTE 12/13/2012  Patient:  Mark Wilkinson, Mark Wilkinson   Account Number:  [id number]  Date Initiated:  12/11/2012  Documentation initiated by:  Lanier Clam  Subjective/Objective Assessment:   ADMITTED W/BLOOD PRESSURE ISSUES.READMIT-7/16-7/18.     Action/Plan:   FROM HOME.ACTIVE W/[facility] HHPT.   Anticipated DC Date:  12/13/2012   Anticipated DC Plan:  HOME W HOME HEALTH SERVICES      DC Planning Services  CM consult      Choice offered to / List presented to:  C-1 Patient   DME arranged  WALKER - ROLLING  BEDSIDE COMMODE      DME agency  [facility]     HH arranged  HH-2 PT  HH-3 OT      [facility] agency  [facility]   Status of service:  Completed, signed off Medicare Important Message given?   (If response is "NO", the following Medicare IM given date fields will be blank) Date Medicare IM given:   Date Additional Medicare IM given:    Discharge Disposition:  HOME W HOME HEALTH SERVICES  Per UR Regulation:  Reviewed for med. necessity/level of care/duration of stay  If discussed at Long Length of Stay Meetings, dates discussed:    Comments:  12/13/12 Bud Kaeser RN,BSN NCM [phone] UPON REVIEW.CONFIRMED W/JAMIE @ [facility] [id number] [phone].[facility] HHPT/OT,DME 3N1/RW WAS DELIVERED BY [facility] YESTERDAY.  12/11/12 Jentri Aye RN,BSN NCM [phone] [facility] ALREADY FOLLOWING FOR HH IF NEEDED.

## 2012-12-11 NOTE — Progress Notes (Signed)
[facility] consulted regarding home health needs.  Patient is currently being followed by [facility].  EDCM left message for [facility] coordinator regarding patient's admission.

## 2012-12-11 NOTE — ED Notes (Signed)
ZOX:WRUE<AV> Expected date:<BR> Expected time:<BR> Means of arrival:<BR> Comments:<BR> EMS-hypotension

## 2012-12-11 NOTE — ED Notes (Signed)
Per EMS, pt recently has knee replacement-took pain meds this am-had [facility] RN come to house today-BP was low-RN called EMS

## 2012-12-11 NOTE — Progress Notes (Signed)
[facility]  Patient Status: Active (receiving services up to time of hospitalization)  [facility] is providing the following services: PT - the patient was set up with [facility] PT at discharge on 12/10/12, the [facility] PT was scheduled for a visit today 12/11/12 but pt has been re-admitted.    If patient discharges after hours, please call [phone].   Lanae Crumbly 12/11/2012, 3:34 PM

## 2012-12-11 NOTE — ED Notes (Signed)
R knee inspected, pink, no edema/red streaking

## 2012-12-11 NOTE — ED Notes (Addendum)
Pt put in trendelenburg position. 1L bolus started. Pt alert. Pt more confused than usual per wife.

## 2012-12-11 NOTE — Progress Notes (Signed)
Proxy forms given to patient for My Chart. Briscoe Burns BSN, RN-BC Admissions RN  12/11/2012 3:10 PM

## 2012-12-12 DIAGNOSIS — G9340 Encephalopathy, unspecified: Secondary | ICD-10-CM | POA: Diagnosis not present

## 2012-12-12 LAB — TROPONIN I: Troponin I: <0.3 ng/mL (ref <0.30)

## 2012-12-12 LAB — BASIC METABOLIC PANEL
Calcium: 9.4 mg/dL (ref 8.4–10.5)
Chloride: 93 mEq/L — ABNORMAL LOW (ref 96–112)
Creatinine, Ser: 1.09 mg/dL (ref 0.50–1.35)
GFR calc Af Amer: 70 mL/min — ABNORMAL LOW (ref >90)
Sodium: 133 mEq/L — ABNORMAL LOW (ref 135–145)

## 2012-12-12 LAB — CBC
Platelets: 204 10*3/uL (ref 150–400)
RDW: 15.5 % (ref 11.5–15.5)
WBC: 10.5 10*3/uL (ref 4.0–10.5)

## 2012-12-12 MED ORDER — DOXYCYCLINE HYCLATE 100 MG PO TABS
ORAL_TABLET | ORAL | Status: AC
  Filled 2012-12-12: qty 1

## 2012-12-12 MED ORDER — HYDROMORPHONE HCL PF 1 MG/ML IJ SOLN
INTRAMUSCULAR | Status: AC
  Filled 2012-12-12: qty 1

## 2012-12-12 NOTE — Evaluation (Signed)
Physical Therapy Evaluation Patient Details Name: Mark Wilkinson MRN: [id number] DOB: 1927-09-25 Today's Date: 12/12/2012 Time: 1137-1206 PT Time Calculation (min): 29 min  PT Assessment / Plan / Recommendation History of Present Illness  pt admitted for elective RTKR 12/08/12, now with acute encephalopathy.  Requires min/mod assist for mobility and PT feels that he would be safer D/Cing to SNF.  discussed with family, however pts wife does not want to have him placed in SNF.  CSW to follow.   Clinical Impression  Tolerated some ambulation in hallway and stair training, however requires min to moderate assist at this time.  Wife shares concern about handling pt at home, however was not open to SNF placement.  Daughter interested in SNF placement and would like to have more information.  CSW notified and to speak with family.  Pt will benefit from skilled PT in acute venue to address deficits.  PT recommends SNF placement at this time for increased safety.     PT Assessment  Patient needs continued PT services    Follow Up Recommendations  SNF;Supervision/Assistance - 24 hour    Does the patient have the potential to tolerate intense rehabilitation      Barriers to Discharge Decreased caregiver support      Equipment Recommendations  None recommended by PT    Recommendations for Other Services OT consult   Frequency 7X/week    Precautions / Restrictions Precautions Precautions: Knee;Fall Required Braces or Orthoses: Knee Immobilizer - Right Knee Immobilizer - Right: Discontinue once straight leg raise with < 10 degree lag Restrictions Weight Bearing Restrictions: No RLE Weight Bearing: Weight bearing as tolerated   Pertinent Vitals/Pain 4/10      Mobility  Bed Mobility Bed Mobility: Not assessed Transfers Transfers: Sit to Stand;Stand to Sit Sit to Stand: With upper extremity assist;With armrests;From chair/3-in-1;4: Min assist Stand to Sit: 4: Min assist;With upper  extremity assist;With armrests;To chair/3-in-1 Details for Transfer Assistance: assist to rise and steady.  Cues for hand and foot placement Ambulation/Gait Ambulation/Gait Assistance: 4: Min assist;3: Mod assist Ambulation Distance (Feet): 55 Feet Assistive device: Rolling walker Ambulation/Gait Assistance Details: Min to mod assist for steadying and safety as pt requires MAX continuous cues for maintaining position inside of RW, maintaining upright posture and proper sequencing/technique.  Gait Pattern: Step-to pattern;Decreased stride length;Trunk flexed Gait velocity: decreased Stairs: Yes Stairs Assistance: 3: Mod assist Stairs Assistance Details (indicate cue type and reason): Max cues for sequencing/technique with rail and crutch with wife and daughter present.   Stair Management Technique: One rail Left;Step to pattern;Forwards;With crutches Number of Stairs: 2    Exercises     PT Diagnosis: Difficulty walking;Abnormality of gait;Generalized weakness;Acute pain  PT Problem List: Decreased strength;Decreased range of motion;Decreased activity tolerance;Pain;Decreased mobility;Decreased knowledge of use of DME;Decreased balance;Decreased cognition;Decreased safety awareness;Decreased coordination PT Treatment Interventions: DME instruction;Gait training;Stair training;Functional mobility training;Therapeutic activities;Therapeutic exercise     PT Goals(Current goals can be found in the care plan section) Acute Rehab PT Goals Patient Stated Goal: to get stronger PT Goal Formulation: With patient/family Time For Goal Achievement: 12/19/12 Potential to Achieve Goals: Good  Visit Information  Last PT Received On: 12/12/12 Assistance Needed: +1 History of Present Illness: pt admitted for elective RTKR 12/08/12, now with acute encephalopathy.  Requires min/mod assist for mobility and PT feels that he would be safer D/Cing to SNF.  discussed with family, however pts wife does not want  to have him placed in SNF.  CSW to follow.  Prior Functioning  Home Living Family/patient expects to be discharged to:: Private residence Living Arrangements: Spouse/significant other Available Help at Discharge: Family Type of Home: House Home Access: Stairs to enter Secretary/administrator of Steps: 3 Entrance Stairs-Rails: Left Home Layout: One level Home Equipment: Shower seat;Grab bars - tub/shower;Hand held Careers information officer - 2 wheels;Cane - single point Prior Function Level of Independence: Independent with assistive device(s) Comments: pt recently used a cane due to pain and swelling in knee Communication Communication: No difficulties    Cognition  Cognition Arousal/Alertness: Awake/alert Behavior During Therapy: WFL for tasks assessed/performed Overall Cognitive Status: History of cognitive impairments - at baseline    Extremity/Trunk Assessment Lower Extremity Assessment Lower Extremity Assessment: Generalized weakness RLE Deficits / Details: RLE continues to have limited ROM and unable to perform SLR on his own.  Cervical / Trunk Assessment Cervical / Trunk Assessment: Normal   Balance    End of Session PT - End of Session Equipment Utilized During Treatment: Gait belt;Right knee immobilizer Activity Tolerance: Patient limited by fatigue Patient left: in chair;with call bell/phone within reach;with family/visitor present Nurse Communication: Mobility status  GP     Vista Deck 12/12/2012, 12:34 PM

## 2012-12-12 NOTE — Progress Notes (Signed)
Asked by PT to discuss SNF with Pt and daughter.  Met with Pt, wife and daughter.  Wife and Pt stated that Pt to d/c home today.  Daughter just asking for SNF list, in case Pt doesn't do well at home.    Provided daughter with SNF list.  Family thanked CSW for the list.  Providence Crosby, [facility] Clinical Social Work [phone]

## 2012-12-12 NOTE — Progress Notes (Signed)
Echo Lab  2D Echocardiogram completed.  Mark Wilkinson L [name], RDCS 12/12/2012 8:55 AM

## 2012-12-12 NOTE — Discharge Summary (Signed)
Physician Discharge Summary  Mark Wilkinson WUJ:[id number] DOB: 1927/08/08 DOA: 12/11/2012  PCP: Rudi Heap, MD  Admit date: 12/11/2012 Discharge date: 12/12/2012  Time spent: Greater than 30 minutes  Recommendations for Outpatient Follow-up:  -Advised to follow up with his PCP in 2 weeks to determine need for antihypertensive medication.   Discharge Diagnoses:  Principal Problem:   Acute encephalopathy Active Problems:   Essential hypertension, benign   S/P right TKA   Hypotension, unspecified   Elevated troponin   Discharge Condition: Stable and improved  Filed Weights   12/11/12 1550  Weight: 99.338 kg (219 lb)    History of present illness:  Patient is a pleasant 79 year old man with past medical history significant for hypertension, who was recently discharged from the hospital yesterday after he had a right total knee replacement. This morning his wife administered his blood pressure medications as scheduled, but an hour later he complained of pain in his knees so she proceeded to give him a Vicodin as well as a muscle relaxer. Noticed that over the next hour and a half he started to become very lethargic which is not his normal self. The home health physical therapist came to start a rehabilitation program for his knee and when she checked his vital signs his blood pressure was noted to be in the 80s and he was sent to the hospital for further evaluation. His blood pressure has responded quickly to some IV fluids given in the emergency department. His lab work is essentially unremarkable with the exception of a slightly elevated troponin of 0.10. We were asked to admit him for further evaluation and management.   Hospital Course:   Acute encephalopathy  -Completely resolved.  -Suspect related to transient hypotension. Please see below for details. No further workup.   Transient hypotension  -Suspect related to medications as he received blood pressure medications on top of  muscle relaxer and Vicodin.  -Blood pressure has responded swiftly to IV fluids.  -We'll continue to hold blood pressure medications at DC. -Family instructed to keep a BP log and to bring to his PCP at time of hospital follow up to determine further need of antihypertensive medications.  Elevated troponin  -Patient denies chest pain, shortness of breath, palpitations, dizziness, lightheadedness.  -Suspect is probably related to transient hypotension.  -Subsequent troponins have been negative. -No acute ischemic abnormalities on EKG.  -Echo with EF of 60-65% and no WMA.  -No further cardiac work up anticipated.  Procedures: ECHO:  Study Conclusions  - Left ventricle: There was mild concentric hypertrophy. Systolic function was normal. The estimated ejection fraction was in the range of 60% to 65%. Wall motion was normal; there were no regional wall motion abnormalities. There was an increased relative contribution of atrial contraction to ventricular filling. Doppler parameters are consistent with abnormal left ventricular relaxation (grade 1 diastolic dysfunction).     Consultations:  None  Discharge Instructions  Discharge Orders   Future Appointments Provider Department Dept Phone   12/21/2012 8:45 AM [facility] [phone]   01/04/2013 9:00 AM Ernestina Penna, MD [facility] [phone]   Future Orders Complete By Expires     Diet - low sodium heart healthy  As directed     Discontinue IV  As directed     Increase activity slowly  As directed         Medication List    STOP taking these medications  atenolol 50 MG tablet  Commonly known as:  TENORMIN     quinapril 10 MG tablet  Commonly known as:  ACCUPRIL      TAKE these medications       allopurinol 300 MG tablet  Commonly known as:  ZYLOPRIM  Take 300 mg by mouth daily.     aspirin 325 MG EC tablet  Take 1 tablet (325 mg total) by  mouth 2 (two) times daily.     atorvastatin 40 MG tablet  Commonly known as:  LIPITOR  Take 40 mg by mouth every evening.     docusate sodium 100 MG capsule  Commonly known as:  COLACE  Take 100 mg by mouth 2 (two) times daily as needed for constipation.     esomeprazole 40 MG capsule  Commonly known as:  NEXIUM  Take 40 mg by mouth daily before breakfast.     ferrous sulfate 325 (65 FE) MG tablet  Take 1 tablet (325 mg total) by mouth 3 (three) times daily after meals.     Fish Oil 1000 MG Caps  Take 1,000 mg by mouth 2 (two) times daily.     GLUCOSAMINE PO  Take 1 tablet by mouth 2 (two) times daily.     HYDROcodone-acetaminophen 7.5-325 MG per tablet  Commonly known as:  NORCO  Take 1-2 tablets by mouth every 4 (four) hours as needed for pain.     LUCENTIS 0.3 MG/0.05ML Soln  Generic drug:  Ranibizumab  Place 1 application into the left eye. Injection q 6-8 weeks     multivitamin with minerals Tabs  Take 1 tablet by mouth daily.     niacin 500 MG CR tablet  Commonly known as:  NIASPAN  Take 1,000 mg by mouth at bedtime.     SUPER B COMPLEX PO  Take 1 tablet by mouth daily.     timolol 0.5 % ophthalmic solution  Commonly known as:  BETIMOL  Place 1 drop into the left eye 2 (two) times daily.     tiZANidine 4 MG capsule  Commonly known as:  ZANAFLEX  Take 1 capsule (4 mg total) by mouth 3 (three) times daily as needed for muscle spasms.     Vitamin D3 1000 UNITS Caps  Take 2,000 Units by mouth daily.       Allergies  Allergen Reactions  . Penicillins Other (See Comments)    Unknown        Follow-up Information   Follow up with Rudi Heap, MD. Schedule an appointment as soon as possible for a visit in 2 weeks.   Contact information:   [address] [place] [address] [phone]        The results of significant diagnostics from this hospitalization (including imaging, microbiology, ancillary and laboratory) are listed below for  reference.    Significant Diagnostic Studies: Dg Chest 2 View  12/11/2012   *RADIOLOGY REPORT*  Clinical Data: Shortness of breath and hypotension  CHEST - 2 VIEW  Comparison: 07/24/2012  Findings: Stable asymmetric elevation of the right hemidiaphragm. No edema or focal airspace consolidation.  Bibasilar chronic atelectasis or scarring is unchanged. The cardiopericardial silhouette is within normal limits for size. Telemetry leads overlie the chest.  IMPRESSION: Stable.  No new or acute interval findings.   Original Report Authenticated By: Kennith Center, M.D.    Microbiology: Recent Results (from the past 240 hour(s))  SURGICAL PCR SCREEN     Status: Abnormal   Collection Time  12/03/12  9:44 AM      Result Value Range Status   MRSA, PCR INVALID RESULTS, SPECIMEN SENT FOR CULTURE (*) NEGATIVE Final   Comment: RESULT CALLED TO, READ BACK BY AND VERIFIED WITH:     NOTIFIED KATRINA DAVIS [id number] @ 1332 BY J SCOTTON   Staphylococcus aureus INVALID RESULTS, SPECIMEN SENT FOR CULTURE (*) NEGATIVE Final   Comment: RESULT CALLED TO, READ BACK BY AND VERIFIED WITH:     NOTIFIED KATRINA DAVIS [id number] @ 1332 BY J SCOTTON                The Xpert SA Assay (FDA     approved for NASAL specimens     in patients over 79 years of age),     is one component of     a comprehensive surveillance     program.  Test performance has     been validated by [employer] for patients greater     than or equal to 46 year old.     It is not intended     to diagnose infection nor to     guide or monitor treatment.  MRSA CULTURE     Status: None   Collection Time    12/03/12  9:44 AM      Result Value Range Status   Specimen Description NOSE   Final   Special Requests NONE   Final   Culture     Final   Value: NO STAPHYLOCOCCUS AUREUS ISOLATED     Note: No MRSA Isolated   Report Status 12/05/2012 FINAL   Final     Labs: Basic Metabolic Panel:  Recent Labs Lab 12/09/12 0443 12/10/12 0520  12/11/12 1244 12/12/12 0559  NA 142 142 139 133*  K 4.2 3.7 3.4* 3.8  CL 108 106 105 93*  CO2 27 29 27 23   GLUCOSE 160* 128* 158* 191*  BUN 19 14 11  25*  CREATININE 0.85 0.84 0.93 1.09  CALCIUM 8.7 8.6 8.0* 9.4   Liver Function Tests:  Recent Labs Lab 12/11/12 1244  AST 17  ALT 9  ALKPHOS 48  BILITOT 0.5  PROT 5.0*  ALBUMIN 2.0*   No results found for this basename: LIPASE, AMYLASE,  in the last 168 hours No results found for this basename: AMMONIA,  in the last 168 hours CBC:  Recent Labs Lab 12/09/12 0443 12/10/12 0520 12/11/12 1244 12/12/12 0559  WBC 12.0* 10.3 7.7 10.5  NEUTROABS  --   --  5.7  --   HGB 11.4* 11.4* 9.7* 10.7*  HCT 33.0* 33.9* 28.8* 32.9*  MCV 87.3 87.6 87.0 85.2  PLT 141* 129* 103* 204   Cardiac Enzymes:  Recent Labs Lab 12/11/12 1735 12/12/12 0202 12/12/12 0559  TROPONINI <0.30 <0.30 <0.30   BNP: BNP (last 3 results) No results found for this basename: PROBNP,  in the last 8760 hours CBG: No results found for this basename: GLUCAP,  in the last 168 hours     Signed:  Chaya Jan  [facility] Pager: [phone] 12/12/2012, 3:03 PM

## 2012-12-14 DIAGNOSIS — IMO0001 Reserved for inherently not codable concepts without codable children: Secondary | ICD-10-CM | POA: Diagnosis not present

## 2012-12-14 DIAGNOSIS — I10 Essential (primary) hypertension: Secondary | ICD-10-CM | POA: Diagnosis not present

## 2012-12-14 DIAGNOSIS — M159 Polyosteoarthritis, unspecified: Secondary | ICD-10-CM | POA: Diagnosis not present

## 2012-12-14 DIAGNOSIS — Z471 Aftercare following joint replacement surgery: Secondary | ICD-10-CM | POA: Diagnosis not present

## 2012-12-14 DIAGNOSIS — Z96659 Presence of unspecified artificial knee joint: Secondary | ICD-10-CM | POA: Diagnosis not present

## 2012-12-14 DIAGNOSIS — M109 Gout, unspecified: Secondary | ICD-10-CM | POA: Diagnosis not present

## 2012-12-14 NOTE — Progress Notes (Signed)
12/12/12 1234  PT Time Calculation  PT Start Time 1137  PT Stop Time 1206  PT Time Calculation (min) 29 min  PT G-Codes **NOT FOR INPATIENT CLASS**  Functional Assessment Tool Used clinical judgement  Functional Limitation Mobility: Walking and moving around  Mobility: Walking and Moving Around Current Status [phone]) CK  Mobility: Walking and Moving Around Goal Status [phone]) CK  Mobility: Walking and Moving Around Discharge Status (W2956) CK  PT General Charges  $$ ACUTE PT VISIT 1 Procedure  PT Evaluation  $Initial PT Evaluation Tier I 1 Procedure  PT Treatments  $Gait Training 23-37 mins

## 2012-12-14 NOTE — Discharge Summary (Signed)
Physician Discharge Summary  Patient ID: Mark Wilkinson MRN: [id number] DOB/AGE: 11-29-1927 79 y.o.  Admit date: 12/08/2012 Discharge date: 12/10/2012   Procedures:  Procedure(s) (LRB): RIGHT TOTAL KNEE ARTHROPLASTY (Right)  Attending Physician:  Dr. Durene Romans   Admission Diagnoses:   Right knee OA / pain  Discharge Diagnoses:  Principal Problem:   S/P right TKA Active Problems:   Expected blood loss anemia   Obese  Past Medical History  Diagnosis Date  . Essential hypertension, benign   . Other and unspecified hyperlipidemia   . Diaphragmatic hernia without mention of obstruction or gangrene   . Gout, unspecified   . Calculus of kidney   . Asymptomatic varicose veins   . Lumbago   . Arthritis   . PONV (postoperative nausea and vomiting)     HPI: Mark Wilkinson, 79 y.o. male, has a history of pain and functional disability in the right knee due to arthritis and has failed non-surgical conservative treatments for greater than 12 weeks to include NSAID's and/or analgesics, corticosteriod injections, viscosupplementation injections and activity modification. Onset of symptoms was gradual, starting >10 years ago with gradually worsening course since that time. The patient noted no past surgery on the right knee(s). Patient currently rates pain in the right knee(s) at 6 out of 10 with activity. Patient has worsening of pain with activity and weight bearing, pain that interferes with activities of daily living, pain with passive range of motion, crepitus and joint swelling. Patient has evidence of periarticular osteophytes and joint space narrowing by imaging studies. There is no active signs of infection. Risks, benefits and expectations were discussed with the patient. Patient understand the risks, benefits and expectations and wishes to proceed with surgery.   PCP: Rudi Heap, MD   Discharged Condition: good  Hospital Course:  Patient underwent the above stated procedure on  12/08/2012. Patient tolerated the procedure well and brought to the recovery room in good condition and subsequently to the floor.  POD #1 BP: 116/69 ; Pulse: 60 ; Temp: 97.7 F (36.5 C) ; Resp: 16  Pt's foley was removed, as well as the hemovac drain removed. IV was changed to a saline lock. Patient reports pain as mild. Patient somewhat confused, but to his normal state. Patient resting comfortably. No events throughout the night.  Worked with PT, but was a little slow on progressing. Neurovascular intact, dorsiflexion/plantar flexion intact, incision: dressing C/D/I, no cellulitis present and compartment soft.   LABS  Basename    HGB  11.4  HCT  33.0   POD #2  BP: 131/70 ; Pulse: 79 ; Temp: 98 F (36.7 C) ; Resp: 18  Patient reports pain as mild, pain well controlled. No events throughout the night.  Worked much better with PT.   Ready to be discharged home.  Neurovascular intact, dorsiflexion/plantar flexion intact, incision: dressing C/D/I, no cellulitis present and compartment soft.    LABS  Basename    HGB  11.4  HCT  3.9    Discharge Exam: General appearance: alert, cooperative and no distress Extremities: Homans sign is negative, no sign of DVT, no edema, redness or tenderness in the calves or thighs and no ulcers, gangrene or trophic changes  Disposition:   Home or Self Care with follow up in 2 weeks   Follow-up Information   Follow up with Shelda Pal, MD. Schedule an appointment as soon as possible for a visit in 2 weeks.   Contact information:   [address]  [address] [place] [address] [phone]       Discharge Orders   Future Appointments Provider Department Dept Phone   12/21/2012 8:45 AM [facility] [phone]   01/04/2013 9:00 AM Ernestina Penna, MD [facility] [phone]   Future Orders Complete By Expires     Call MD / Call 911  As directed     Comments:      If you  experience chest pain or shortness of breath, CALL 911 and be transported to the hospital emergency room.  If you develope a fever above 101 F, pus (white drainage) or increased drainage or redness at the wound, or calf pain, call your surgeon's office.    Change dressing  As directed     Comments:      Maintain surgical dressing for 10-14 days, then change the dressing daily with sterile 4 x 4 inch gauze dressing and tape. Keep the area dry and clean.    Constipation Prevention  As directed     Comments:      Drink plenty of fluids.  Prune juice may be helpful.  You may use a stool softener, such as Colace (over the counter) 100 mg twice a day.  Use MiraLax (over the counter) for constipation as needed.    Diet - low sodium heart healthy  As directed     Discharge instructions  As directed     Comments:      Maintain surgical dressing for 10-14 days, then replace with gauze and tape. Keep the area dry and clean until follow up. Follow up in 2 weeks at [facility]. Call with any questions or concerns.    Driving restrictions  As directed     Comments:      No driving for 4 weeks    Increase activity slowly as tolerated  As directed     TED hose  As directed     Comments:      Use stockings (TED hose) for 2 weeks on both leg(s).  You may remove them at night for sleeping.    Weight bearing as tolerated  As directed          Medication List    STOP taking these medications       atenolol 50 MG tablet  Commonly known as:  TENORMIN     quinapril 10 MG tablet  Commonly known as:  ACCUPRIL      TAKE these medications       allopurinol 300 MG tablet  Commonly known as:  ZYLOPRIM  Take 300 mg by mouth daily.     aspirin 325 MG EC tablet  Take 1 tablet (325 mg total) by mouth 2 (two) times daily.     atorvastatin 40 MG tablet  Commonly known as:  LIPITOR  Take 40 mg by mouth every evening.     esomeprazole 40 MG capsule  Commonly known as:  NEXIUM  Take 40 mg by  mouth daily before breakfast.     ferrous sulfate 325 (65 FE) MG tablet  Take 1 tablet (325 mg total) by mouth 3 (three) times daily after meals.     Fish Oil 1000 MG Caps  Take 1,000 mg by mouth 2 (two) times daily.     GLUCOSAMINE PO  Take 1 tablet by mouth 2 (two) times daily.     HYDROcodone-acetaminophen 7.5-325 MG per tablet  Commonly known as:  NORCO  Take 1-2 tablets  by mouth every 4 (four) hours as needed for pain.     LUCENTIS 0.3 MG/0.05ML Soln  Generic drug:  Ranibizumab  Place 1 application into the left eye. Injection q 6-8 weeks     multivitamin with minerals Tabs  Take 1 tablet by mouth daily.     SUPER B COMPLEX PO  Take 1 tablet by mouth daily.     timolol 0.5 % ophthalmic solution  Commonly known as:  BETIMOL  Place 1 drop into the left eye 2 (two) times daily.     tiZANidine 4 MG capsule  Commonly known as:  ZANAFLEX  Take 1 capsule (4 mg total) by mouth 3 (three) times daily as needed for muscle spasms.     Vitamin D3 1000 UNITS Caps  Take 2,000 Units by mouth daily.         Signed: [name]. Cynai Skeens   PAC  12/14/2012, 9:20 AM

## 2012-12-15 DIAGNOSIS — I10 Essential (primary) hypertension: Secondary | ICD-10-CM | POA: Diagnosis not present

## 2012-12-15 DIAGNOSIS — M109 Gout, unspecified: Secondary | ICD-10-CM | POA: Diagnosis not present

## 2012-12-15 DIAGNOSIS — Z471 Aftercare following joint replacement surgery: Secondary | ICD-10-CM | POA: Diagnosis not present

## 2012-12-15 DIAGNOSIS — IMO0001 Reserved for inherently not codable concepts without codable children: Secondary | ICD-10-CM | POA: Diagnosis not present

## 2012-12-15 DIAGNOSIS — Z96659 Presence of unspecified artificial knee joint: Secondary | ICD-10-CM | POA: Diagnosis not present

## 2012-12-15 DIAGNOSIS — M159 Polyosteoarthritis, unspecified: Secondary | ICD-10-CM | POA: Diagnosis not present

## 2012-12-16 ENCOUNTER — Telehealth: Payer: Self-pay | Admitting: *Deleted

## 2012-12-16 DIAGNOSIS — Z471 Aftercare following joint replacement surgery: Secondary | ICD-10-CM | POA: Diagnosis not present

## 2012-12-16 DIAGNOSIS — M109 Gout, unspecified: Secondary | ICD-10-CM | POA: Diagnosis not present

## 2012-12-16 DIAGNOSIS — I10 Essential (primary) hypertension: Secondary | ICD-10-CM | POA: Diagnosis not present

## 2012-12-16 DIAGNOSIS — M159 Polyosteoarthritis, unspecified: Secondary | ICD-10-CM | POA: Diagnosis not present

## 2012-12-16 DIAGNOSIS — IMO0001 Reserved for inherently not codable concepts without codable children: Secondary | ICD-10-CM | POA: Diagnosis not present

## 2012-12-16 DIAGNOSIS — Z96659 Presence of unspecified artificial knee joint: Secondary | ICD-10-CM | POA: Diagnosis not present

## 2012-12-16 NOTE — Telephone Encounter (Signed)
He was taking atenolol 50mg  BID and Quinapril 10mg  qd

## 2012-12-16 NOTE — Telephone Encounter (Signed)
Start back the quinapril until he comes in

## 2012-12-16 NOTE — Telephone Encounter (Signed)
Mark Wilkinson just wanted to give an FYI that his BP has been running in the 150 over 90 they took him off his BP meds on the 18th at the hospital due to BP being low after knee surgery. He has an appt with you next week but they weren't sure if you wanted him to restart his medication or just wait until his appt with you.

## 2012-12-16 NOTE — Telephone Encounter (Signed)
Find out what he was on previously it name and milligrams and frequency and we will probably restart that at half the amount he was on previously until he comes back in to be seen  Let me knpow when you find that

## 2012-12-16 NOTE — Telephone Encounter (Signed)
Patient wife aware and she will start him back on his medication. She was told to keep a close watch on his BP

## 2012-12-17 ENCOUNTER — Telehealth: Payer: Self-pay | Admitting: *Deleted

## 2012-12-17 DIAGNOSIS — M159 Polyosteoarthritis, unspecified: Secondary | ICD-10-CM | POA: Diagnosis not present

## 2012-12-17 DIAGNOSIS — I10 Essential (primary) hypertension: Secondary | ICD-10-CM | POA: Diagnosis not present

## 2012-12-17 DIAGNOSIS — Z96659 Presence of unspecified artificial knee joint: Secondary | ICD-10-CM | POA: Diagnosis not present

## 2012-12-17 DIAGNOSIS — M109 Gout, unspecified: Secondary | ICD-10-CM | POA: Diagnosis not present

## 2012-12-17 DIAGNOSIS — Z471 Aftercare following joint replacement surgery: Secondary | ICD-10-CM | POA: Diagnosis not present

## 2012-12-17 DIAGNOSIS — IMO0001 Reserved for inherently not codable concepts without codable children: Secondary | ICD-10-CM | POA: Diagnosis not present

## 2012-12-17 NOTE — Telephone Encounter (Signed)
Go ahead and restart the atenolol as previously taking before he went into the hospital

## 2012-12-17 NOTE — Telephone Encounter (Signed)
Ok, pts wife aware to restart med and f/u on tuesday

## 2012-12-17 NOTE — Telephone Encounter (Signed)
Mark Wilkinson, the physical therapist called today concerned about Mark Wilkinson HR and BP. He has appt on tues with you, but wanted you to know this asap. Mark Wilkinson was taken back to the hosp after surgery due to his BP dropping and they took him off of quinapril and atenolol. He since has started back the quinapril only. Last pm after taking the quinapril his bp was still 146/90. Today as the physical therapist worked with him, she states over a 3 min time period his HR went from 65 to 130. After exertion it went as high as 160. They were questioning if you thought he should go ahead and restart the atenolol as well. Pt does have some on hand. Please call wife Mark Wilkinson) with an answer.   Quinapril is 10 mg one po qd Atenolol is  50 mg 1/2 am and one whole at night

## 2012-12-18 DIAGNOSIS — IMO0001 Reserved for inherently not codable concepts without codable children: Secondary | ICD-10-CM | POA: Diagnosis not present

## 2012-12-18 DIAGNOSIS — M109 Gout, unspecified: Secondary | ICD-10-CM | POA: Diagnosis not present

## 2012-12-18 DIAGNOSIS — I10 Essential (primary) hypertension: Secondary | ICD-10-CM | POA: Diagnosis not present

## 2012-12-18 DIAGNOSIS — Z96659 Presence of unspecified artificial knee joint: Secondary | ICD-10-CM | POA: Diagnosis not present

## 2012-12-18 DIAGNOSIS — Z471 Aftercare following joint replacement surgery: Secondary | ICD-10-CM | POA: Diagnosis not present

## 2012-12-18 DIAGNOSIS — M159 Polyosteoarthritis, unspecified: Secondary | ICD-10-CM | POA: Diagnosis not present

## 2012-12-21 ENCOUNTER — Other Ambulatory Visit: Payer: Medicare Other

## 2012-12-21 DIAGNOSIS — Z4789 Encounter for other orthopedic aftercare: Secondary | ICD-10-CM | POA: Diagnosis not present

## 2012-12-22 ENCOUNTER — Ambulatory Visit (INDEPENDENT_AMBULATORY_CARE_PROVIDER_SITE_OTHER): Payer: Medicare Other | Admitting: Family Medicine

## 2012-12-22 ENCOUNTER — Encounter: Payer: Self-pay | Admitting: Family Medicine

## 2012-12-22 VITALS — BP 101/56 | HR 63 | Temp 96.9°F | Ht 68.0 in | Wt 204.6 lb

## 2012-12-22 DIAGNOSIS — I959 Hypotension, unspecified: Secondary | ICD-10-CM | POA: Diagnosis not present

## 2012-12-22 DIAGNOSIS — Z96659 Presence of unspecified artificial knee joint: Secondary | ICD-10-CM | POA: Diagnosis not present

## 2012-12-22 DIAGNOSIS — R5381 Other malaise: Secondary | ICD-10-CM | POA: Diagnosis not present

## 2012-12-22 DIAGNOSIS — IMO0001 Reserved for inherently not codable concepts without codable children: Secondary | ICD-10-CM | POA: Diagnosis not present

## 2012-12-22 DIAGNOSIS — Z471 Aftercare following joint replacement surgery: Secondary | ICD-10-CM | POA: Diagnosis not present

## 2012-12-22 DIAGNOSIS — R5383 Other fatigue: Secondary | ICD-10-CM

## 2012-12-22 DIAGNOSIS — I10 Essential (primary) hypertension: Secondary | ICD-10-CM | POA: Diagnosis not present

## 2012-12-22 DIAGNOSIS — M159 Polyosteoarthritis, unspecified: Secondary | ICD-10-CM | POA: Diagnosis not present

## 2012-12-22 DIAGNOSIS — M109 Gout, unspecified: Secondary | ICD-10-CM | POA: Diagnosis not present

## 2012-12-22 DIAGNOSIS — R350 Frequency of micturition: Secondary | ICD-10-CM

## 2012-12-22 NOTE — Patient Instructions (Signed)
For now, continue quinapril 10 mg daily, reduce atenolol to 25 mg twice a day If blood pressure goes back up add back an additional 25 mg of atenolol at bedtime We will review blood pressure readings and heart rates at next visit in August If there is any question about his blood pressure going back up or being too low please feel free to call us back For now, do not start rapaflow until blood pressure is stable

## 2012-12-22 NOTE — Progress Notes (Signed)
  Subjective:    Patient ID: Mark Wilkinson, male    DOB: [birth year], 79 y.o.   MRN: [id number]  HPI Patient comes in today for for followup from recent hospital visit for right knee replacement on 12/08/2012. He had some issues with his blood pressure following his hospital discharge. He is currently taking quinapril 10 mg in the morning and atenolol 25 mg in the morning and 50 mg at night. See home blood pressure readings and pulse rates and brought in by the patient's wife today.   Review of Systems  Constitutional: Positive for fatigue (improving).  HENT: Positive for congestion. Negative for ear pain, postnasal drip and sinus pressure.   Eyes: Negative.   Respiratory: Negative.  Negative for cough, choking, shortness of breath and stridor.   Cardiovascular: Negative.  Negative for chest pain, palpitations and leg swelling.  Gastrointestinal: Negative.  Negative for nausea, vomiting, abdominal pain, diarrhea and constipation.  Endocrine: Negative.   Genitourinary: Positive for frequency.  Musculoskeletal: Positive for arthralgias (R knee, recent replacement 7/15).  Skin: Negative.   Neurological: Negative for dizziness and headaches.  Hematological: Negative.   Psychiatric/Behavioral: Negative.  Negative for sleep disturbance. The patient is not nervous/anxious.        Objective:   Physical Exam  Nursing note and vitals reviewed. Constitutional: He is oriented to person, place, and time. He appears well-developed and well-nourished. No distress.  For his age and considering he is 2 weeks status post knee replacement  HENT:  Head: Normocephalic.  Eyes: Conjunctivae are normal. Right eye exhibits no discharge. Left eye exhibits no discharge. No scleral icterus.  Neck: Normal range of motion. Neck supple.  Cardiovascular: Normal rate and regular rhythm.   Murmur heard. At 72 per minute  Pulmonary/Chest: Effort normal and breath sounds normal. No respiratory distress. He has no  wheezes. He has no rales.  Musculoskeletal: He exhibits edema.  Patient has walker with him today. There is edema of the right knee and upper thigh. Range of motion is limited by the fact that he is status post right knee replacement  Neurological: He is alert and oriented to person, place, and time.  Skin: Skin is warm and dry. No rash noted. No erythema.  Psychiatric: He has a normal mood and affect. His behavior is normal. Judgment and thought content normal.          Assessment & Plan:  1. Hypertension  2. Hypotension  3. Fatigue  4. Urinary frequency  Patient Instructions  For now, continue quinapril 10 mg daily, reduce atenolol to 25 mg twice a day If blood pressure goes back up add back an additional 25 mg of atenolol at bedtime We will review blood pressure readings and heart rates at next visit in August If there is any question about his blood pressure going back up or being too low please feel free to call us back For now, do not start rapaflow until blood pressure is stable   Nyra Capes MD

## 2012-12-23 DIAGNOSIS — M109 Gout, unspecified: Secondary | ICD-10-CM | POA: Diagnosis not present

## 2012-12-23 DIAGNOSIS — I10 Essential (primary) hypertension: Secondary | ICD-10-CM | POA: Diagnosis not present

## 2012-12-23 DIAGNOSIS — Z471 Aftercare following joint replacement surgery: Secondary | ICD-10-CM | POA: Diagnosis not present

## 2012-12-23 DIAGNOSIS — IMO0001 Reserved for inherently not codable concepts without codable children: Secondary | ICD-10-CM | POA: Diagnosis not present

## 2012-12-23 DIAGNOSIS — M159 Polyosteoarthritis, unspecified: Secondary | ICD-10-CM | POA: Diagnosis not present

## 2012-12-23 DIAGNOSIS — Z96659 Presence of unspecified artificial knee joint: Secondary | ICD-10-CM | POA: Diagnosis not present

## 2012-12-24 DIAGNOSIS — M109 Gout, unspecified: Secondary | ICD-10-CM | POA: Diagnosis not present

## 2012-12-24 DIAGNOSIS — Z471 Aftercare following joint replacement surgery: Secondary | ICD-10-CM | POA: Diagnosis not present

## 2012-12-24 DIAGNOSIS — M159 Polyosteoarthritis, unspecified: Secondary | ICD-10-CM | POA: Diagnosis not present

## 2012-12-24 DIAGNOSIS — I10 Essential (primary) hypertension: Secondary | ICD-10-CM | POA: Diagnosis not present

## 2012-12-24 DIAGNOSIS — Z96659 Presence of unspecified artificial knee joint: Secondary | ICD-10-CM | POA: Diagnosis not present

## 2012-12-24 DIAGNOSIS — IMO0001 Reserved for inherently not codable concepts without codable children: Secondary | ICD-10-CM | POA: Diagnosis not present

## 2012-12-25 DIAGNOSIS — IMO0001 Reserved for inherently not codable concepts without codable children: Secondary | ICD-10-CM | POA: Diagnosis not present

## 2012-12-25 DIAGNOSIS — Z471 Aftercare following joint replacement surgery: Secondary | ICD-10-CM | POA: Diagnosis not present

## 2012-12-25 DIAGNOSIS — I10 Essential (primary) hypertension: Secondary | ICD-10-CM | POA: Diagnosis not present

## 2012-12-25 DIAGNOSIS — M109 Gout, unspecified: Secondary | ICD-10-CM | POA: Diagnosis not present

## 2012-12-25 DIAGNOSIS — M159 Polyosteoarthritis, unspecified: Secondary | ICD-10-CM | POA: Diagnosis not present

## 2012-12-25 DIAGNOSIS — Z96659 Presence of unspecified artificial knee joint: Secondary | ICD-10-CM | POA: Diagnosis not present

## 2012-12-30 ENCOUNTER — Ambulatory Visit: Payer: Medicare Other | Attending: Orthopedic Surgery | Admitting: Physical Therapy

## 2012-12-30 DIAGNOSIS — R269 Unspecified abnormalities of gait and mobility: Secondary | ICD-10-CM | POA: Insufficient documentation

## 2012-12-30 DIAGNOSIS — M25569 Pain in unspecified knee: Secondary | ICD-10-CM | POA: Diagnosis not present

## 2012-12-30 DIAGNOSIS — R5381 Other malaise: Secondary | ICD-10-CM | POA: Diagnosis not present

## 2012-12-30 DIAGNOSIS — M25669 Stiffness of unspecified knee, not elsewhere classified: Secondary | ICD-10-CM | POA: Diagnosis not present

## 2012-12-30 DIAGNOSIS — Z96659 Presence of unspecified artificial knee joint: Secondary | ICD-10-CM | POA: Insufficient documentation

## 2012-12-30 DIAGNOSIS — IMO0001 Reserved for inherently not codable concepts without codable children: Secondary | ICD-10-CM | POA: Diagnosis not present

## 2012-12-31 ENCOUNTER — Ambulatory Visit: Payer: Medicare Other | Admitting: *Deleted

## 2012-12-31 ENCOUNTER — Ambulatory Visit: Payer: Medicare Other

## 2012-12-31 VITALS — BP 84/56 | HR 73 | Temp 97.1°F | Ht 68.0 in | Wt 202.8 lb

## 2012-12-31 DIAGNOSIS — I959 Hypotension, unspecified: Secondary | ICD-10-CM

## 2012-12-31 NOTE — Patient Instructions (Addendum)
Per [facility] pt to d/c Quinapril and take 1/2 tablet of Atenolol bid Pt will keep record of bp and rtc on Monday.

## 2013-01-04 ENCOUNTER — Encounter: Payer: Self-pay | Admitting: Family Medicine

## 2013-01-04 ENCOUNTER — Ambulatory Visit (INDEPENDENT_AMBULATORY_CARE_PROVIDER_SITE_OTHER): Payer: Medicare Other | Admitting: Family Medicine

## 2013-01-04 VITALS — BP 98/67 | HR 69 | Temp 97.4°F | Ht 65.0 in | Wt 199.4 lb

## 2013-01-04 DIAGNOSIS — E559 Vitamin D deficiency, unspecified: Secondary | ICD-10-CM | POA: Diagnosis not present

## 2013-01-04 DIAGNOSIS — I10 Essential (primary) hypertension: Secondary | ICD-10-CM | POA: Diagnosis not present

## 2013-01-04 DIAGNOSIS — E785 Hyperlipidemia, unspecified: Secondary | ICD-10-CM | POA: Diagnosis not present

## 2013-01-04 DIAGNOSIS — I959 Hypotension, unspecified: Secondary | ICD-10-CM

## 2013-01-04 LAB — POCT CBC
Hemoglobin: 13.1 g/dL — AB (ref 14.1–18.1)
Lymph, poc: 2.3 (ref 0.6–3.4)
MCHC: 32.3 g/dL (ref 31.8–35.4)
MPV: 7.8 fL (ref 0–99.8)
POC Granulocyte: 6.2 (ref 2–6.9)
RBC: 4.7 M/uL (ref 4.69–6.13)

## 2013-01-04 NOTE — Progress Notes (Signed)
Subjective:    Patient ID: Mark Wilkinson, male    DOB: 09/24/1927, 79 y.o.   MRN: [id number]  HPI Patient comes in today for followup and management of chronic medical problems. He is status post right knee arthroplasty mid July of this year. He is with his wife today. Since his surgery he has had some problems with low blood pressure and lightheadedness. These blood pressure numbers were reviewed with patient and his wife today. He is currently taking atenolol 25 one half tablet twice daily. His blood pressures at home are ranging from a low of 88/47 and a high of 146/82. Please see scanned in numbers. His pulse is running in the 60s and 70s the majority of the time.     Review of Systems  Constitutional: Positive for fatigue (slight). Negative for activity change and appetite change.  HENT: Positive for congestion (slight due to allergies) and sneezing (intermitent). Negative for ear pain, sore throat and sinus pressure.   Eyes: Negative.  Negative for photophobia, pain, discharge, redness, itching and visual disturbance.  Respiratory: Negative.  Negative for cough, chest tightness, shortness of breath and wheezing.   Cardiovascular: Negative.  Negative for chest pain, palpitations and leg swelling.  Gastrointestinal: Negative for nausea, vomiting, abdominal pain, diarrhea, constipation, blood in stool and abdominal distention.  Genitourinary: Positive for frequency. Negative for hematuria and enuresis.  Musculoskeletal: Positive for back pain (occasional LBP) and arthralgias (bilateral knees R>L).  Skin: Negative.  Negative for color change, pallor, rash and wound.  Allergic/Immunologic: Positive for environmental allergies (slight).  Neurological: Negative for dizziness, tremors, syncope, weakness, light-headedness, numbness and headaches.  Psychiatric/Behavioral: Positive for decreased concentration (slight memory issues, forgetful). Negative for sleep disturbance. The patient is not  nervous/anxious.        Objective:   Physical Exam BP 98/67  Pulse 69  Temp(Src) 97.4 F (36.3 C) (Oral)  Ht 5\' 5"  (1.651 m)  Wt 199 lb 6.4 oz (90.447 kg)  BMI 33.18 kg/m2  The patient appeared well nourished and normally developed, alert and oriented to time and place. He has his usual quiet demeanor. Speech, behavior and judgement appear normal. Vital signs as documented.  Head exam is unremarkable. No scleral icterus or pallor noted. Ears nose and throat were normal. There was some minimal cerumen in both ear canals appear Neck is without jugular venous distension, thyromegally, or carotid bruits. Carotid upstrokes are brisk bilaterally. No cervical adenopathy. Lungs are clear anteriorly and posteriorly to auscultation. Normal respiratory effort. Cardiac exam reveals regular rate and rhythm at 72 per minute. First and second heart sounds normal.  No murmurs, rubs or gallops.  Abdominal exam reveals normal bowl sounds, no masses, no organomegaly and no aortic enlargement. No inguinal adenopathy. Extremities are nonedematous . There was warmth and swelling without edema in the right knee. This knee appeared to have good flexion and extension. Skin without pallor or jaundice.  Warm and dry, without rash. Neurologic exam reveals normal deep tendon reflexes and normal sensation.          Assessment & Plan:  1. Hypertension - POCT CBC; Standing - BMP8+EGFR; Standing  2. Hypotension  3. Hyperlipemia - NMR, lipoprofile; Standing - Hepatic function panel; Standing  4. Vitamin D deficiency - Vitamin D 25 hydroxy; Standing  5. Status post right knee arthroplasty  Patient Instructions  Fall precautions discussed, continue physical therapy and follow their recommendations Continue current meds and therapuetic lifestyle changes Continue to monitor blood pressure Return to clinic in  September or October for a flu shot   Take extra strength Tylenol if needed for pain, one 4  times daily if needed  Nyra Capes MD

## 2013-01-04 NOTE — Patient Instructions (Addendum)
Fall precautions discussed, continue physical therapy and follow their recommendations Continue current meds and therapuetic lifestyle changes Continue to monitor blood pressure Return to clinic in September or October for a flu shot

## 2013-01-04 NOTE — Addendum Note (Signed)
Addended by: Prescott Gum on: 01/04/2013 09:46 AM   Modules accepted: Orders

## 2013-01-05 ENCOUNTER — Ambulatory Visit: Payer: Medicare Other

## 2013-01-06 ENCOUNTER — Ambulatory Visit: Payer: Medicare Other | Admitting: Physical Therapy

## 2013-01-06 LAB — BMP8+EGFR
BUN: 10 mg/dL (ref 8–27)
CO2: 27 mmol/L (ref 18–29)
Calcium: 9.9 mg/dL (ref 8.6–10.2)
Creatinine, Ser: 0.92 mg/dL (ref 0.76–1.27)

## 2013-01-06 LAB — NMR, LIPOPROFILE
HDL Cholesterol by NMR: 46 mg/dL (ref >=40)
HDL Particle Number: 30.6 umol/L (ref >=30.5)
Small LDL Particle Number: 103 nmol/L (ref <=527)
Triglycerides by NMR: 80 mg/dL (ref <150)

## 2013-01-06 LAB — HEPATIC FUNCTION PANEL
Albumin: 3.9 g/dL (ref 3.5–4.7)
Total Bilirubin: 0.5 mg/dL (ref 0.0–1.2)
Total Protein: 6.8 g/dL (ref 6.0–8.5)

## 2013-01-06 LAB — VITAMIN D 25 HYDROXY (VIT D DEFICIENCY, FRACTURES): Vit D, 25-Hydroxy: 48.8 ng/mL (ref 30.0–100.0)

## 2013-01-08 ENCOUNTER — Ambulatory Visit: Payer: Medicare Other | Admitting: Physical Therapy

## 2013-01-09 ENCOUNTER — Other Ambulatory Visit: Payer: Self-pay | Admitting: Family Medicine

## 2013-01-11 ENCOUNTER — Ambulatory Visit: Payer: Medicare Other | Admitting: Physical Therapy

## 2013-01-11 NOTE — Progress Notes (Signed)
Pt aware of lab results, will continue iron for another month/ch

## 2013-01-13 ENCOUNTER — Ambulatory Visit: Payer: Medicare Other | Admitting: Physical Therapy

## 2013-01-15 ENCOUNTER — Ambulatory Visit: Payer: Medicare Other | Admitting: Physical Therapy

## 2013-01-18 DIAGNOSIS — H348192 Central retinal vein occlusion, unspecified eye, stable: Secondary | ICD-10-CM | POA: Diagnosis not present

## 2013-01-18 DIAGNOSIS — H35359 Cystoid macular degeneration, unspecified eye: Secondary | ICD-10-CM | POA: Diagnosis not present

## 2013-01-19 ENCOUNTER — Ambulatory Visit: Payer: Medicare Other | Admitting: *Deleted

## 2013-01-19 DIAGNOSIS — M25569 Pain in unspecified knee: Secondary | ICD-10-CM | POA: Diagnosis not present

## 2013-01-19 DIAGNOSIS — M5137 Other intervertebral disc degeneration, lumbosacral region: Secondary | ICD-10-CM | POA: Diagnosis not present

## 2013-01-19 DIAGNOSIS — I10 Essential (primary) hypertension: Secondary | ICD-10-CM | POA: Diagnosis not present

## 2013-01-19 DIAGNOSIS — M9981 Other biomechanical lesions of cervical region: Secondary | ICD-10-CM | POA: Diagnosis not present

## 2013-01-19 DIAGNOSIS — M999 Biomechanical lesion, unspecified: Secondary | ICD-10-CM | POA: Diagnosis not present

## 2013-01-20 ENCOUNTER — Ambulatory Visit: Payer: Medicare Other

## 2013-01-21 ENCOUNTER — Ambulatory Visit: Payer: Medicare Other | Admitting: Physical Therapy

## 2013-01-22 DIAGNOSIS — Z4789 Encounter for other orthopedic aftercare: Secondary | ICD-10-CM | POA: Diagnosis not present

## 2013-01-26 ENCOUNTER — Ambulatory Visit: Payer: Medicare Other | Attending: Orthopedic Surgery | Admitting: *Deleted

## 2013-01-26 DIAGNOSIS — IMO0001 Reserved for inherently not codable concepts without codable children: Secondary | ICD-10-CM | POA: Insufficient documentation

## 2013-01-26 DIAGNOSIS — Z96659 Presence of unspecified artificial knee joint: Secondary | ICD-10-CM | POA: Insufficient documentation

## 2013-01-26 DIAGNOSIS — M25569 Pain in unspecified knee: Secondary | ICD-10-CM | POA: Diagnosis not present

## 2013-01-26 DIAGNOSIS — R5381 Other malaise: Secondary | ICD-10-CM | POA: Insufficient documentation

## 2013-01-26 DIAGNOSIS — R269 Unspecified abnormalities of gait and mobility: Secondary | ICD-10-CM | POA: Insufficient documentation

## 2013-01-26 DIAGNOSIS — M25669 Stiffness of unspecified knee, not elsewhere classified: Secondary | ICD-10-CM | POA: Insufficient documentation

## 2013-01-28 ENCOUNTER — Ambulatory Visit: Payer: Medicare Other | Admitting: *Deleted

## 2013-02-02 ENCOUNTER — Ambulatory Visit: Payer: Medicare Other

## 2013-02-04 ENCOUNTER — Ambulatory Visit: Payer: Medicare Other

## 2013-02-08 ENCOUNTER — Ambulatory Visit: Payer: Medicare Other | Admitting: Physical Therapy

## 2013-02-10 ENCOUNTER — Ambulatory Visit: Payer: Medicare Other

## 2013-02-16 ENCOUNTER — Ambulatory Visit: Payer: Medicare Other

## 2013-02-18 ENCOUNTER — Ambulatory Visit: Payer: Medicare Other | Admitting: Physical Therapy

## 2013-02-23 ENCOUNTER — Ambulatory Visit: Payer: Medicare Other

## 2013-02-23 DIAGNOSIS — M25569 Pain in unspecified knee: Secondary | ICD-10-CM | POA: Diagnosis not present

## 2013-02-23 DIAGNOSIS — M999 Biomechanical lesion, unspecified: Secondary | ICD-10-CM | POA: Diagnosis not present

## 2013-02-23 DIAGNOSIS — M5137 Other intervertebral disc degeneration, lumbosacral region: Secondary | ICD-10-CM | POA: Diagnosis not present

## 2013-02-23 DIAGNOSIS — I10 Essential (primary) hypertension: Secondary | ICD-10-CM | POA: Diagnosis not present

## 2013-02-23 DIAGNOSIS — M9981 Other biomechanical lesions of cervical region: Secondary | ICD-10-CM | POA: Diagnosis not present

## 2013-02-25 ENCOUNTER — Ambulatory Visit: Payer: Medicare Other | Attending: Orthopedic Surgery

## 2013-02-25 DIAGNOSIS — IMO0001 Reserved for inherently not codable concepts without codable children: Secondary | ICD-10-CM | POA: Diagnosis not present

## 2013-02-25 DIAGNOSIS — R269 Unspecified abnormalities of gait and mobility: Secondary | ICD-10-CM | POA: Diagnosis not present

## 2013-02-25 DIAGNOSIS — R5381 Other malaise: Secondary | ICD-10-CM | POA: Insufficient documentation

## 2013-02-25 DIAGNOSIS — M25569 Pain in unspecified knee: Secondary | ICD-10-CM | POA: Diagnosis not present

## 2013-02-25 DIAGNOSIS — Z96659 Presence of unspecified artificial knee joint: Secondary | ICD-10-CM | POA: Insufficient documentation

## 2013-02-25 DIAGNOSIS — M25669 Stiffness of unspecified knee, not elsewhere classified: Secondary | ICD-10-CM | POA: Diagnosis not present

## 2013-03-01 DIAGNOSIS — H348192 Central retinal vein occlusion, unspecified eye, stable: Secondary | ICD-10-CM | POA: Diagnosis not present

## 2013-03-01 DIAGNOSIS — H353190 Nonexudative age-related macular degeneration, unspecified eye, stage unspecified: Secondary | ICD-10-CM | POA: Diagnosis not present

## 2013-03-03 ENCOUNTER — Ambulatory Visit (INDEPENDENT_AMBULATORY_CARE_PROVIDER_SITE_OTHER): Payer: Medicare Other | Admitting: *Deleted

## 2013-03-03 ENCOUNTER — Encounter (INDEPENDENT_AMBULATORY_CARE_PROVIDER_SITE_OTHER): Payer: Self-pay

## 2013-03-03 DIAGNOSIS — Z23 Encounter for immunization: Secondary | ICD-10-CM

## 2013-03-18 ENCOUNTER — Emergency Department (HOSPITAL_COMMUNITY): Payer: Medicare Other

## 2013-03-18 ENCOUNTER — Observation Stay (HOSPITAL_COMMUNITY)
Admission: EM | Admit: 2013-03-18 | Discharge: 2013-03-19 | Disposition: A | Discharge status: Home / Self Care | Payer: Medicare Other | Attending: Internal Medicine | Admitting: Internal Medicine

## 2013-03-18 ENCOUNTER — Ambulatory Visit (INDEPENDENT_AMBULATORY_CARE_PROVIDER_SITE_OTHER): Payer: Medicare Other | Admitting: Nurse Practitioner

## 2013-03-18 ENCOUNTER — Encounter (HOSPITAL_COMMUNITY): Payer: Self-pay | Admitting: Emergency Medicine

## 2013-03-18 VITALS — BP 136/77 | HR 67 | Temp 96.5°F

## 2013-03-18 DIAGNOSIS — Z880 Allergy status to penicillin: Secondary | ICD-10-CM | POA: Diagnosis not present

## 2013-03-18 DIAGNOSIS — R079 Chest pain, unspecified: Secondary | ICD-10-CM | POA: Diagnosis present

## 2013-03-18 DIAGNOSIS — Z87891 Personal history of nicotine dependence: Secondary | ICD-10-CM | POA: Diagnosis not present

## 2013-03-18 DIAGNOSIS — E785 Hyperlipidemia, unspecified: Secondary | ICD-10-CM | POA: Insufficient documentation

## 2013-03-18 DIAGNOSIS — Z7982 Long term (current) use of aspirin: Secondary | ICD-10-CM | POA: Insufficient documentation

## 2013-03-18 DIAGNOSIS — Z87442 Personal history of urinary calculi: Secondary | ICD-10-CM | POA: Diagnosis not present

## 2013-03-18 DIAGNOSIS — Z79899 Other long term (current) drug therapy: Secondary | ICD-10-CM | POA: Insufficient documentation

## 2013-03-18 DIAGNOSIS — M129 Arthropathy, unspecified: Secondary | ICD-10-CM | POA: Insufficient documentation

## 2013-03-18 DIAGNOSIS — M109 Gout, unspecified: Secondary | ICD-10-CM | POA: Insufficient documentation

## 2013-03-18 DIAGNOSIS — I10 Essential (primary) hypertension: Secondary | ICD-10-CM | POA: Diagnosis not present

## 2013-03-18 DIAGNOSIS — R0789 Other chest pain: Principal | ICD-10-CM | POA: Insufficient documentation

## 2013-03-18 DIAGNOSIS — F0390 Unspecified dementia without behavioral disturbance: Secondary | ICD-10-CM | POA: Diagnosis not present

## 2013-03-18 DIAGNOSIS — I8390 Asymptomatic varicose veins of unspecified lower extremity: Secondary | ICD-10-CM | POA: Insufficient documentation

## 2013-03-18 DIAGNOSIS — K449 Diaphragmatic hernia without obstruction or gangrene: Secondary | ICD-10-CM | POA: Insufficient documentation

## 2013-03-18 HISTORY — DX: Unspecified dementia, unspecified severity, without behavioral disturbance, psychotic disturbance, mood disturbance, and anxiety: F03.90

## 2013-03-18 HISTORY — DX: Other chronic pain: G89.29

## 2013-03-18 HISTORY — DX: Gastro-esophageal reflux disease without esophagitis: K21.9

## 2013-03-18 HISTORY — DX: Low back pain, unspecified: M54.50

## 2013-03-18 HISTORY — DX: Low back pain: M54.5

## 2013-03-18 LAB — COMPREHENSIVE METABOLIC PANEL
ALT: 10 U/L (ref 0–53)
AST: 19 U/L (ref 0–37)
Albumin: 3.1 g/dL — ABNORMAL LOW (ref 3.5–5.2)
Alkaline Phosphatase: 83 U/L (ref 39–117)
GFR calc Af Amer: >90 mL/min (ref >90)
Glucose, Bld: 153 mg/dL — ABNORMAL HIGH (ref 70–99)
Potassium: 3.7 mEq/L (ref 3.5–5.1)
Sodium: 139 mEq/L (ref 135–145)
Total Protein: 6.8 g/dL (ref 6.0–8.3)

## 2013-03-18 LAB — CREATININE, SERUM
Creatinine, Ser: 0.81 mg/dL (ref 0.50–1.35)
GFR calc Af Amer: >90 mL/min (ref >90)
GFR calc non Af Amer: 80 mL/min — ABNORMAL LOW (ref >90)

## 2013-03-18 LAB — CBC
Hemoglobin: 13.5 g/dL (ref 13.0–17.0)
Hemoglobin: 12.8 g/dL — ABNORMAL LOW (ref 13.0–17.0)
MCHC: 33.8 g/dL (ref 30.0–36.0)
Platelets: 131 10*3/uL — ABNORMAL LOW (ref 150–400)
Platelets: 124 10*3/uL — ABNORMAL LOW (ref 150–400)
RBC: 4.39 MIL/uL (ref 4.22–5.81)
WBC: 7.7 10*3/uL (ref 4.0–10.5)

## 2013-03-18 LAB — TROPONIN I: Troponin I: <0.3 ng/mL (ref <0.30)

## 2013-03-18 MED ORDER — ALLOPURINOL 300 MG PO TABS
300.0000 mg | ORAL_TABLET | Freq: Every day | ORAL | Status: DC
  Administered 2013-03-19: 300 mg via ORAL
  Filled 2013-03-18: qty 1

## 2013-03-18 MED ORDER — NIACIN ER (ANTIHYPERLIPIDEMIC) 500 MG PO TBCR
1000.0000 mg | EXTENDED_RELEASE_TABLET | Freq: Every day | ORAL | Status: DC
  Filled 2013-03-18: qty 2

## 2013-03-18 MED ORDER — ATENOLOL 12.5 MG HALF TABLET
12.5000 mg | ORAL_TABLET | Freq: Two times a day (BID) | ORAL | Status: DC
  Administered 2013-03-18 – 2013-03-19 (×2): 12.5 mg via ORAL
  Filled 2013-03-18 (×3): qty 1

## 2013-03-18 MED ORDER — PANTOPRAZOLE SODIUM 40 MG PO TBEC
80.0000 mg | DELAYED_RELEASE_TABLET | Freq: Every day | ORAL | Status: DC
  Administered 2013-03-19: 80 mg via ORAL
  Filled 2013-03-18: qty 2

## 2013-03-18 MED ORDER — NITROGLYCERIN 0.4 MG SL SUBL: 0.4000 mg | SUBLINGUAL_TABLET | Freq: Once | SUBLINGUAL | Status: DC

## 2013-03-18 MED ORDER — LISINOPRIL 10 MG PO TABS
10.0000 mg | ORAL_TABLET | Freq: Every day | ORAL | Status: DC
  Administered 2013-03-18 – 2013-03-19 (×2): 10 mg via ORAL
  Filled 2013-03-18 (×2): qty 1

## 2013-03-18 MED ORDER — NITROGLYCERIN 0.4 MG SL SUBL
0.4000 mg | SUBLINGUAL_TABLET | SUBLINGUAL | Status: DC | PRN
  Filled 2013-03-18: qty 25

## 2013-03-18 MED ORDER — ONDANSETRON HCL 4 MG/2ML IJ SOLN: 4.0000 mg | Freq: Four times a day (QID) | INTRAMUSCULAR | Status: DC | PRN

## 2013-03-18 MED ORDER — SODIUM CHLORIDE 0.9 % IV SOLN: INTRAVENOUS | Status: DC

## 2013-03-18 MED ORDER — TIMOLOL HEMIHYDRATE 0.5 % OP SOLN: 1.0000 [drp] | Freq: Two times a day (BID) | OPHTHALMIC | Status: DC

## 2013-03-18 MED ORDER — ASPIRIN EC 81 MG PO TBEC
81.0000 mg | DELAYED_RELEASE_TABLET | Freq: Every day | ORAL | Status: DC
  Administered 2013-03-19: 81 mg via ORAL
  Filled 2013-03-18: qty 1

## 2013-03-18 MED ORDER — ENOXAPARIN SODIUM 40 MG/0.4ML ~~LOC~~ SOLN
40.0000 mg | SUBCUTANEOUS | Status: DC
  Administered 2013-03-18: 40 mg via SUBCUTANEOUS
  Filled 2013-03-18 (×2): qty 0.4

## 2013-03-18 MED ORDER — ACETAMINOPHEN 325 MG PO TABS
650.0000 mg | ORAL_TABLET | ORAL | Status: DC | PRN
  Administered 2013-03-19: 650 mg via ORAL
  Filled 2013-03-18: qty 2

## 2013-03-18 MED ORDER — ESOMEPRAZOLE MAGNESIUM 40 MG PO PACK: 40.0000 mg | PACK | Freq: Every day | ORAL | Status: DC

## 2013-03-18 MED ORDER — SODIUM CHLORIDE 0.9 % IV SOLN: 250.0000 mL | INTRAVENOUS | Status: DC | PRN

## 2013-03-18 MED ORDER — ATORVASTATIN CALCIUM 40 MG PO TABS
40.0000 mg | ORAL_TABLET | Freq: Every evening | ORAL | Status: DC
  Administered 2013-03-18 – 2013-03-19 (×2): 40 mg via ORAL
  Filled 2013-03-18 (×2): qty 1

## 2013-03-18 MED ORDER — ASPIRIN 81 MG PO CHEW: CHEWABLE_TABLET | ORAL | Status: DC

## 2013-03-18 MED ORDER — NITROGLYCERIN 0.4 MG SL SUBL: 0.4000 mg | SUBLINGUAL_TABLET | SUBLINGUAL | Status: DC | PRN

## 2013-03-18 MED ORDER — SODIUM CHLORIDE 0.9 % IJ SOLN
3.0000 mL | Freq: Two times a day (BID) | INTRAMUSCULAR | Status: DC
  Administered 2013-03-18: 3 mL via INTRAVENOUS

## 2013-03-18 MED ORDER — TIMOLOL MALEATE 0.5 % OP SOLN
1.0000 [drp] | Freq: Two times a day (BID) | OPHTHALMIC | Status: DC
  Administered 2013-03-18 – 2013-03-19 (×2): 1 [drp] via OPHTHALMIC
  Filled 2013-03-18: qty 5

## 2013-03-18 MED ORDER — SODIUM CHLORIDE 0.9 % IJ SOLN: 3.0000 mL | INTRAMUSCULAR | Status: DC | PRN

## 2013-03-18 NOTE — ED Notes (Signed)
Per EMS- pt was seen at PCP for chest pain for 2 days. Pt does not remember telling MD this. Pt received 324 mg Asprin and 1 nitro that relieved pain. Pt had PAC on EMS monitor. Pt does not understand why he is here. Pt family is on the way. NAD noted.

## 2013-03-18 NOTE — Progress Notes (Signed)
  Subjective:    Patient ID: Mark Wilkinson, male    DOB: 1927-12-11, 79 y.o.   MRN: [id number]  Chest Pain  This is a new problem. The current episode started yesterday. The onset quality is sudden. The problem occurs constantly. The problem has been waxing and waning. The pain is present in the substernal region. The pain is at a severity of 8/10. The pain is moderate. The quality of the pain is described as pressure (pain is achin in back). The pain radiates to the left shoulder and upper back. Associated symptoms include back pain. Pertinent negatives include no diaphoresis, dizziness, numbness or shortness of breath. The pain is aggravated by movement and lifting. He has tried acetaminophen for the symptoms. The treatment provided mild relief.  Pertinent negatives for past medical history include no anxiety/panic attacks and no strokes.  His family medical history is significant for CAD in family and heart disease in family.      Review of Systems  Constitutional: Negative for diaphoresis.  Respiratory: Negative for shortness of breath.   Cardiovascular: Positive for chest pain.  Musculoskeletal: Positive for back pain.  Neurological: Negative for dizziness and numbness.       Objective:   Physical Exam  Constitutional: He appears well-developed and well-nourished.  Cardiovascular: Normal rate, regular rhythm and normal heart sounds.   Pulmonary/Chest: Effort normal and breath sounds normal.  Skin: Skin is warm.  EKG- NSR 11:45 pain 8/10- O2 non rebreather at 2 l 11:45- 4 chewable baby aspirin 11:50- nitro glycerin 0.4 SL- IV 18g left wrist- NACL KVO 11:55- pain rated 5/10  Pulse 64         Assessment & Plan:   1. Chest pain    To [place] via EMS  Mary-Margaret Daphine Deutscher, FNP

## 2013-03-18 NOTE — H&P (Signed)
Date: 03/18/2013               Patient Name:  Mark Wilkinson MRN: [id number]  DOB: [birth year] Age / Sex: 79 y.o., male   PCP: Ernestina Penna, MD         Medical Service: Internal Medicine Teaching Service         Attending Physician: Dr. Jonah Blue    First Contact: Dr. Yetta Barre Pager: [phone]  Second Contact: Dr. Virgina Organ Pager: [phone]       After Hours (After 5p/  First Contact Pager: [phone]  weekends / holidays): Second Contact Pager: [phone]   Chief Complaint: Chest pain/shoulder pain  History of Present Illness:  [name] is a 79 y.o. male w/ PMHx of HTN, HLD, gout, OA (s/p R. Knee replacement), and dementia, presents to the ED w/ complaints of chest pain and left shoulder pain since last evening. The patient has h/o dementia, most of the history was given by his wife. The patient had reported left shoulder pain last night, over his scapula with some elevated BP in the 160's/100's. The pain presented when he was sitting in his chair and subsided on its own. This AM, the patient had the same reported left shoulder pain but then with accompanying chest pain that the patient describes as "discomfort" rather than pain. This discomfort was also present at rest while sitting in his chair as well, and was not worsened by movement or deep inspiration. This pain was relieved by NTG. He does not describe any accompanying diaphoresis, nausea, vomiting, SOB, dizziness, lightheadedness, or palpitations. No history of previous DVT/PE, no recent travel or immobilization. No history of CAD, or cardiac catheterization in the past.   Meds: Current Facility-Administered Medications  Medication Dose Route Frequency Provider Last Rate Last Dose  . 0.9 %  sodium chloride infusion   Intravenous Continuous Darden Palmer, MD      . 0.9 %  sodium chloride infusion  250 mL Intravenous PRN Darden Palmer, MD      . acetaminophen (TYLENOL) tablet 650 mg  650 mg Oral Q4H PRN Darden Palmer, MD      .  Melene Muller ON 03/19/2013] allopurinol (ZYLOPRIM) tablet 300 mg  300 mg Oral Daily Darden Palmer, MD      . Melene Muller ON 03/19/2013] aspirin EC tablet 81 mg  81 mg Oral Daily Darden Palmer, MD      . atenolol (TENORMIN) tablet 12.5 mg  12.5 mg Oral BID Darden Palmer, MD      . atorvastatin (LIPITOR) tablet 40 mg  40 mg Oral QPM Darden Palmer, MD      . enoxaparin (LOVENOX) injection 40 mg  40 mg Subcutaneous Q24H Darden Palmer, MD      . lisinopril (PRINIVIL,ZESTRIL) tablet 10 mg  10 mg Oral Daily Darden Palmer, MD      . Melene Muller ON 03/19/2013] niacin (NIASPAN) CR tablet 1,000 mg  1,000 mg Oral QHS Darden Palmer, MD      . nitroGLYCERIN (NITROSTAT) SL tablet 0.4 mg  0.4 mg Sublingual Q5 min PRN Kristen N Ward, DO      . nitroGLYCERIN (NITROSTAT) SL tablet 0.4 mg  0.4 mg Sublingual Once Darden Palmer, MD      . nitroGLYCERIN (NITROSTAT) SL tablet 0.4 mg  0.4 mg Sublingual Q5 Min x 3 PRN Darden Palmer, MD      . ondansetron (ZOFRAN) injection 4 mg  4 mg Intravenous Q6H PRN Darden Palmer, MD      . [  START ON 03/19/2013] pantoprazole (PROTONIX) EC tablet 80 mg  80 mg Oral Daily Darden Palmer, MD      . sodium chloride 0.9 % injection 3 mL  3 mL Intravenous Q12H Darden Palmer, MD      . sodium chloride 0.9 % injection 3 mL  3 mL Intravenous PRN Darden Palmer, MD      . timolol (TIMOPTIC) 0.5 % ophthalmic solution 1 drop  1 drop Left Eye BID Darden Palmer, MD        Allergies: Allergies as of 03/18/2013 - Review Complete 03/18/2013  Allergen Reaction Noted  . Penicillins Other (See Comments) 08/25/2010   Past Medical History  Diagnosis Date  . Essential hypertension, benign   . Other and unspecified hyperlipidemia   . Diaphragmatic hernia without mention of obstruction or gangrene   . Gout, unspecified   . Calculus of kidney   . Asymptomatic varicose veins   . Lumbago   . Arthritis   . PONV (postoperative nausea and vomiting)   . Dementia    Past Surgical History  Procedure  Laterality Date  . Appendectomy    . Tonsilectomy, adenoidectomy, bilateral myringotomy and tubes    . Cholecystectomy    . Cystoscopy with retrograde pyelogram, ureteroscopy and stent placement Left 08/13/2012    Procedure: CYSTOSCOPY WITH RETROGRADE PYELOGRAM, URETEROSCOPY AND STENT PLACEMENT;  Surgeon: Sebastian Ache, MD;  Location: [facility];  Service: Urology;  Laterality: Left;  . Tonsillectomy    . Eye surgery      left cataract with lens implant  . Total knee arthroplasty Right 12/08/2012    Procedure: RIGHT TOTAL KNEE ARTHROPLASTY;  Surgeon: Shelda Pal, MD;  Location: [facility];  Service: Orthopedics;  Laterality: Right;   Family History  Problem Relation Age of Onset  . Heart disease Mother     Died in her 53s  . Hypertension Mother   . Stroke Father   . CAD Brother     MI 43s   History   Social History  . Marital Status: Married    Spouse Name: N/A    Number of Children: 4  . Years of Education: N/A   Occupational History  . 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 Smoking status: Former Smoker    Types: Cigarettes    Quit date: 12/11/1960  . Smokeless tobacco: Never Used     Comment: Light smoker.  Quit 1962  . Alcohol Use: No  . Drug Use: No  . Sexual Activity: Not on file   Other Topics Concern  . Not on file   Social History Narrative   Lives with wife of 53 years.      Review of Systems: General: Denies fever, chills, diaphoresis, appetite change and fatigue.  Respiratory: Denies SOB, DOE, cough, chest tightness, and wheezing.   Cardiovascular: Positive for chest pain. Denies palpitations and leg swelling.  Gastrointestinal: Denies nausea, vomiting, abdominal pain, diarrhea, constipation, blood in stool and abdominal distention.  Genitourinary: Denies dysuria, urgency, frequency, hematuria, flank pain and difficulty urinating.  Endocrine: Denies hot or cold intolerance, sweats, polyuria, polydipsia. Musculoskeletal:  Positive for right shoulder pain. Denies myalgias, back pain, joint swelling, arthralgias and gait problem.  Skin: Denies pallor, rash and wounds.  Neurological: Denies dizziness, seizures, syncope, weakness, lightheadedness, numbness and headaches.  Psychiatric/Behavioral: Positive for h/o dementia. Denies mood changes, confusion, nervousness, sleep disturbance and agitation.  Physical Exam: Filed Vitals:   03/18/13 1430 03/18/13 1530 03/18/13  1615 03/18/13 1658  BP: 112/58 129/66 134/83 147/83  Pulse: 63 66 65 66  Temp:    97.6 F (36.4 C)  TempSrc:    Oral  Resp: 21 18 23 19   Height:      Weight:      SpO2: 98% 98% 99% 96%   General: Vital signs reviewed.  Patient is a well-developed and well-nourished, in no acute distress and cooperative with exam. Alert and oriented x2 (pt. w/ baseline Dementia) Head: Normocephalic and atraumatic. Eyes: PERRL, EOMI, conjunctivae normal, No scleral icterus.  Neck: Supple, trachea midline, normal ROM, No JVD, masses, thyromegaly, or carotid bruit present.  Cardiovascular: RRR, S1 normal, S2 normal, no murmurs, gallops, or rubs. No tenderness to palpation. Pulmonary/Chest: Normal respiratory effort, CTAB, no wheezes, rales, or rhonchi. Abdominal: Soft, non-tender, non-distended, bowel sounds are normal, no masses, organomegaly, or guarding present.  Musculoskeletal: No joint deformities, erythema, or stiffness, ROM full and no nontender. Extremities: No swelling or edema,  pulses symmetric and intact bilaterally. No cyanosis or clubbing. Neurological: A&O x2 (not oriented to time). Strength is normal and symmetric bilaterally, cranial nerve II-XII are grossly intact, no focal motor deficit, sensory intact to light touch bilaterally.  Skin: Warm, dry and intact. No rashes or erythema. Psychiatric: Normal mood and affect. speech and behavior is normal. Cognition slightly impaired, memory moderately impaired.  Lab results: Basic Metabolic  Panel:  Recent Labs  03/18/13 1339  NA 139  K 3.7  CL 103  CO2 28  GLUCOSE 153*  BUN 12  CREATININE 0.82  CALCIUM 9.0   Liver Function Tests:  Recent Labs  03/18/13 1339  AST 19  ALT 10  ALKPHOS 83  BILITOT 0.5  PROT 6.8  ALBUMIN 3.1*   CBC:  Recent Labs  03/18/13 1339  WBC 8.1  HGB 13.5  HCT 40.0  MCV 87.9  PLT 131*   Imaging results:  Dg Chest 2 View  03/18/2013   CLINICAL DATA:  79 year old male with left chest pain. Initial encounter.  EXAM: CHEST  2 VIEW  COMPARISON:  12/11/2012 and earlier.  FINDINGS: Stable elevation of the right hemidiaphragm. Larger lung volumes today. Normal cardiac size and mediastinal contours. Visualized tracheal air column is within normal limits. No pneumothorax or pulmonary edema. No pleural effusion or confluent pulmonary opacity. Stable visualized osseous structures. Right upper quadrant surgical clips re- identified.  IMPRESSION: Stable. No acute cardiopulmonary abnormality.   Electronically Signed   By: Augusto Gamble M.D.   On: 03/18/2013 13:54    Other results: EKG: NSR @ 62 bpm. No dynamic/ischemic changes  Assessment & Plan by Problem: [name] is a 79 y.o. male w/ PMHx of HTN, HLD, gout, OA (s/p R. Knee replacement), and dementia, presents to the ED w/ complaints of chest pain and left shoulder pain since last evening.  Chest pain- Patient with reported shoulder pain and chest pain at rest. Chest pain described as a "discomfort" at rest, relieved by NTG at home. No previous history of CAD. EKG shows no acute ischemic changes. POC troponin 0.00. Given no obvious EKG changes and normal poc troponin, most likely MSK at this time, however, ACS must be ruled out. Patient w/ no reported recent immobility, previous h/o DVT/PE, therefore PE unlikely at this time given reported history. -Patient admitted to telemetry. -Troponins x 3 pending -Repeat EKG in AM -ECHO for AM -Continue ASA 81 mg qd, Lipitor 40 mg qd, and Atenolol  12.5 mg qd -HbA1c, lipid panel,  TSH -Nitroglycerin sublingual prn  HTN- Patient with recent reported BP in the 160's/100's at home. On admission, BP 112/58, most recent 147/83. -Continue home meds, Lisinopril 10 mg qd, Atenolol 12.5 mg qd  Gout- Stable at this time -Continue Allopurinol  HLD- Stable at this time;  -Lipid panel in AM -Continue Lipitor 40 mg qhs  Dispo: Disposition is deferred at this time, awaiting improvement of current medical problems. Anticipated discharge in approximately 1-2 day(s).   The patient does have a current PCP Ernestina Penna, MD) and does need an [facility] hospital follow-up appointment after discharge.  The patient does not have transportation limitations that hinder transportation to clinic appointments.  Signed: Courtney Paris, MD 03/18/2013, 6:16 PM  Pager; [phone]

## 2013-03-18 NOTE — ED Notes (Signed)
Diet ordered for pt.  Pt and family made aware.

## 2013-03-18 NOTE — ED Notes (Signed)
Notes from PCP in pts box.

## 2013-03-18 NOTE — ED Provider Notes (Signed)
TIME SEEN: 2:16 PM  CHIEF COMPLAINT: Chest pain  HPI: Patient is an 79 year old male with a history of cognitive impairment, hypertension, hyperlipidemia, prior tobacco use who presents emergency department from his primary care physician's office with complaints of chest pain. Patient reports that he began having right shoulder discomfort last night and this morning began having discomfort into his chest that read it into his arm and back. He is unable to describe the pain. Patient reports pain improved with nitroglycerin. No aggravating factors. No shortness of breath. No nausea or vomiting. No dizziness. Diaphoresis. Patient denies a history of CAD, prior cardiac catheterization or stents. His last stress test was many years ago. Denies a history of PE or DVT. No lower extremity swelling or pain. No recent fracture, trauma, surgery, hospitalization, prolonged immobilization. No fever or cough.  PCP is Dr. Christell Constant at [facility]  ROS: See HPI Constitutional: no fever  Eyes: no drainage  ENT: no runny nose   Cardiovascular:  chest pain  Resp: no SOB  GI: no vomiting GU: no dysuria Integumentary: no rash  Allergy: no hives  Musculoskeletal: no leg swelling  Neurological: no slurred speech ROS otherwise negative  PAST MEDICAL HISTORY/PAST SURGICAL HISTORY:  Past Medical History  Diagnosis Date  . Essential hypertension, benign   . Other and unspecified hyperlipidemia   . Diaphragmatic hernia without mention of obstruction or gangrene   . Gout, unspecified   . Calculus of kidney   . Asymptomatic varicose veins   . Lumbago   . Arthritis   . PONV (postoperative nausea and vomiting)   . Dementia     MEDICATIONS:  Prior to Admission medications   Medication Sig Start Date End Date Taking? Authorizing Provider  allopurinol (ZYLOPRIM) 300 MG tablet Take 300 mg by mouth daily.    Historical Provider, MD  aspirin 81 MG chewable tablet 4 chewable baby asa 03/18/13   Mary-Margaret  Daphine Deutscher, FNP  atenolol (TENORMIN) 25 MG tablet Take 12.5 mg by mouth daily. 1/2 tablet twice a day    Historical Provider, MD  atorvastatin (LIPITOR) 40 MG tablet Take 40 mg by mouth every evening.     Historical Provider, MD  B Complex-C (SUPER B COMPLEX PO) Take 1 tablet by mouth daily.    Historical Provider, MD  Cholecalciferol (VITAMIN D3) 1000 UNITS CAPS Take 2,000 Units by mouth daily.     Historical Provider, MD  GLUCOSAMINE PO Take 1 tablet by mouth 2 (two) times daily.    Historical Provider, MD  HYDROcodone-acetaminophen (NORCO) 7.5-325 MG per tablet Take 1-2 tablets by mouth every 4 (four) hours as needed for pain. 12/09/12   Genelle Gather Babish, PA-C  Multiple Vitamin (MULTIVITAMIN WITH MINERALS) TABS Take 1 tablet by mouth daily.    Historical Provider, MD  NEXIUM 40 MG capsule TAKE 1 CAPSULE DAILY 01/09/13   Ernestina Penna, MD  niacin (NIASPAN) 500 MG CR tablet Take 1,000 mg by mouth at bedtime.    Historical Provider, MD  Omega-3 Fatty Acids (FISH OIL) 1000 MG CAPS Take 1,000 mg by mouth 2 (two) times daily.     Historical Provider, MD  Ranibizumab (LUCENTIS) 0.3 MG/0.05ML SOLN Place 1 application into the left eye. Injection q 6-8 weeks    Historical Provider, MD  timolol (BETIMOL) 0.5 % ophthalmic solution Place 1 drop into the left eye 2 (two) times daily.    Historical Provider, MD    ALLERGIES:  Allergies  Allergen Reactions  . Penicillins Other (See Comments)  Unknown     SOCIAL HISTORY:  History  Substance Use Topics  . Smoking status: Former Smoker    Types: Cigarettes    Quit date: 12/11/1960  . Smokeless tobacco: Never Used     Comment: Light smoker.  Quit 1962  . Alcohol Use: No    FAMILY HISTORY: Family History  Problem Relation Age of Onset  . Heart disease Mother     Died in her 51s  . Hypertension Mother   . Stroke Father   . CAD Brother     MI 73s    EXAM: BP 119/70  Pulse 64  Temp(Src) 98.1 F (36.7 C) (Oral)  Resp 15  Ht 5\' 9"  (1.753  m)  Wt 210 lb (95.255 kg)  BMI 31 kg/m2  SpO2 98% CONSTITUTIONAL: Alert and oriented and responds appropriately to questions. Well-appearing; well-nourished HEAD: Normocephalic EYES: Conjunctivae clear, PERRL ENT: normal nose; no rhinorrhea; moist mucous membranes; pharynx without lesions noted NECK: Supple, no meningismus, no LAD  CARD: RRR; S1 and S2 appreciated; no murmurs, no clicks, no rubs, no gallops RESP: Normal chest excursion without splinting or tachypnea; breath sounds clear and equal bilaterally; no wheezes, no rhonchi, no rales,  ABD/GI: Normal bowel sounds; non-distended; soft, non-tender, no rebound, no guarding BACK:  The back appears normal and is non-tender to palpation, there is no CVA tenderness EXT: Normal ROM in all joints; non-tender to palpation; no edema; normal capillary refill; no cyanosis    SKIN: Normal color for age and race; warm NEURO: Moves all extremities equally PSYCH: The patient's mood and manner are appropriate. Grooming and personal hygiene are appropriate.  MEDICAL DECISION MAKING: Patient here with chest discomfort that improve with nitroglycerin. EKG shows no ST changes. He does have multiple risk factors however for ACS. Wife reports his last stress test in many years ago. Will obtain cardiac labs but have recommended admission for rule out.  ED PROGRESS: Patient's cardiac labs are unremarkable. Troponin negative. Chest x-ray clear. Patient still hemodynamically stable.  Spoke with family medicine for admission for ACS rule out given his multiple risk factors.     Date: 03/18/2013 13:31  Rate: 64  Rhythm: normal sinus rhythm  QRS Axis: normal  Intervals: normal  ST/T Wave abnormalities: normal  Conduction Disutrbances: none  Narrative Interpretation: unremarkable; no ischemic changes, no ectopy       Eulises Kijowski N Andriana Casa, DO 03/18/13 1506

## 2013-03-19 DIAGNOSIS — I10 Essential (primary) hypertension: Secondary | ICD-10-CM

## 2013-03-19 DIAGNOSIS — R079 Chest pain, unspecified: Secondary | ICD-10-CM

## 2013-03-19 DIAGNOSIS — E785 Hyperlipidemia, unspecified: Secondary | ICD-10-CM

## 2013-03-19 DIAGNOSIS — M109 Gout, unspecified: Secondary | ICD-10-CM

## 2013-03-19 DIAGNOSIS — R0789 Other chest pain: Secondary | ICD-10-CM | POA: Diagnosis not present

## 2013-03-19 LAB — HEMOGLOBIN A1C
Hgb A1c MFr Bld: 5.6 % (ref <5.7)
Mean Plasma Glucose: 114 mg/dL (ref <117)

## 2013-03-19 LAB — CBC
MCHC: 33.4 g/dL (ref 30.0–36.0)
Platelets: 135 10*3/uL — ABNORMAL LOW (ref 150–400)
RDW: 13.7 % (ref 11.5–15.5)
WBC: 9.2 10*3/uL (ref 4.0–10.5)

## 2013-03-19 LAB — LIPID PANEL
HDL: 48 mg/dL (ref >39)
LDL Cholesterol: 56 mg/dL (ref 0–99)
Triglycerides: 67 mg/dL (ref <150)
VLDL: 13 mg/dL (ref 0–40)

## 2013-03-19 LAB — TROPONIN I
Troponin I: <0.3 ng/mL (ref <0.30)
Troponin I: <0.3 ng/mL (ref <0.30)

## 2013-03-19 MED ORDER — ASPIRIN 81 MG PO TBEC: 81.0000 mg | DELAYED_RELEASE_TABLET | Freq: Every day | ORAL | Status: DC

## 2013-03-19 NOTE — Progress Notes (Signed)
Pt provided with dc instructions and education. No questions at this time. IV rmoved with tip intact. Heart montior cleaned and returned to front. Ilona Sorrel, RN

## 2013-03-19 NOTE — H&P (Signed)
INTERNAL MEDICINE TEACHING SERVICE Attending Admission Note  Date: 03/19/2013  Patient name: ARNOL MCGIBBON  Medical record number: [id number]  Date of birth: 12/11/1927    I have seen and evaluated Marja Kays and discussed their care with the Residency Team.  79 yr old male with hx HL, dementia,  HTN, presented with CP and shoulder pain. The patient and wife at bedside stated that yesterday the described CP/shoulder pain/back pain started after taking a walk longer than he is used to.  He stated he got worried and came to the ED. The pain was almost completely resolved by SL NTG. He has ruled out for ACS. He has no concerning ST changes in ECG.  His pain is atypical, but was clearly brought on by effort. He is a TIMI 3. Would ask cardiology if they can give consulting opinion on inpatient stress vs outpatient stress test.  Otherwise, if they do not feel he needs an inpatient stress, then he can be discharged with follow up with them as an outpatient in next 1-2 weeks.  Jonah Blue, DO, FACP Faculty [facility] Internal Medicine Residency Program 03/19/2013, 12:41 PM

## 2013-03-19 NOTE — Progress Notes (Signed)
Subjective: [name] is a 79 y.o. male w/ PMHx of HTN, HLD, gout, OA (s/p R. Knee replacement), and dementia, presents to the ED w/ complaints of chest pain and left shoulder pain for 12 hours prior to admission.  Seen at bedside this AM. Denies any current chest pain. Does complain of some mild shoulder pain, bilaterally, which hurts with movement. Otherwise, the patient denies any SOB, dizziness, lightheadedness,or palpitations.  Objective: Vital signs in last 24 hours: Filed Vitals:   03/18/13 2328 03/18/13 2340 03/19/13 0645 03/19/13 0650  BP: 181/108 183/95 173/90 167/96  Pulse:  81 83 79  Temp:   98.8 F (37.1 C)   TempSrc:   Oral   Resp: 22 19 18 18   Height:      Weight:   211 lb 1.6 oz (95.754 kg)   SpO2: 97% 99% 95%    Weight change:  No intake or output data in the 24 hours ending 03/19/13 1213  Physical Exam: General: Alert, cooperative, and in no apparent distress HEENT: Vision grossly intact, oropharynx clear and non-erythematous  Neck: Full range of motion without pain, supple, no lymphadenopathy or carotid bruits Lungs: Clear to ascultation bilaterally, normal work of respiration, no wheezes, rales, ronchi Heart: Regular rate and rhythm, no murmurs, gallops, or rubs Abdomen: Soft, non-tender, non-distended, normal bowel sounds Extremities: No cyanosis, clubbing, or edema Neurologic: Alert & oriented x2, cranial nerves II-XII intact, strength grossly intact, sensation intact to light touch  Lab Results: Basic Metabolic Panel:  Recent Labs Lab 03/18/13 1339 03/18/13 1915  NA 139  --   K 3.7  --   CL 103  --   CO2 28  --   GLUCOSE 153*  --   BUN 12  --   CREATININE 0.82 0.81  CALCIUM 9.0  --    Liver Function Tests:  Recent Labs Lab 03/18/13 1339  AST 19  ALT 10  ALKPHOS 83  BILITOT 0.5  PROT 6.8  ALBUMIN 3.1*   CBC:  Recent Labs Lab 03/18/13 1915 03/19/13 0612  WBC 7.7 9.2  HGB 12.8* 13.9  HCT 38.3* 41.6  MCV 87.2 86.8    PLT 124* 135*   Cardiac Enzymes:  Recent Labs Lab 03/18/13 1915 03/18/13 2345 03/19/13 0612  TROPONINI <0.30 <0.30 <0.30   Fasting Lipid Panel:  Recent Labs Lab 03/19/13 0612  CHOL 117  HDL 48  LDLCALC 56  TRIG 67  CHOLHDL 2.4   Thyroid Function Tests:  Recent Labs Lab 03/19/13 0612  TSH 1.030   Studies/Results: Dg Chest 2 View  03/18/2013   CLINICAL DATA:  79 year old male with left chest pain. Initial encounter.  EXAM: CHEST  2 VIEW  COMPARISON:  12/11/2012 and earlier.  FINDINGS: Stable elevation of the right hemidiaphragm. Larger lung volumes today. Normal cardiac size and mediastinal contours. Visualized tracheal air column is within normal limits. No pneumothorax or pulmonary edema. No pleural effusion or confluent pulmonary opacity. Stable visualized osseous structures. Right upper quadrant surgical clips re- identified.  IMPRESSION: Stable. No acute cardiopulmonary abnormality.   Electronically Signed   By: Augusto Gamble M.D.   On: 03/18/2013 13:54   Medications: I have reviewed the patient's current medications. Scheduled Meds: . allopurinol  300 mg Oral Daily  . aspirin EC  81 mg Oral Daily  . atenolol  12.5 mg Oral BID  . atorvastatin  40 mg Oral QPM  . enoxaparin (LOVENOX) injection  40 mg Subcutaneous Q24H  . lisinopril  10  mg Oral Daily  . niacin  1,000 mg Oral QHS  . nitroGLYCERIN  0.4 mg Sublingual Once  . pantoprazole  80 mg Oral Daily  . sodium chloride  3 mL Intravenous Q12H  . timolol  1 drop Left Eye BID   Continuous Infusions: . sodium chloride     PRN Meds:.sodium chloride, acetaminophen, nitroGLYCERIN, nitroGLYCERIN, ondansetron (ZOFRAN) IV, sodium chloride Assessment/Plan: [name] is a 79 y.o. male w/ PMHx of HTN, HLD, gout, OA (s/p R. Knee replacement), and dementia, presented to the ED w/ complaints of chest pain and left shoulder pain.  Chest pain- Patient with reported shoulder pain and chest pain at rest. Chest pain described  as a "discomfort" at rest, relieved by NTG at home. No previous history of CAD. EKG shows no acute ischemic changes. POC troponin 0.00. Trops x 3 -ve. Given no obvious EKG changes and normal troponins, most likely MSK at this time, however, ACS must be ruled out. Patient w/ no reported recent immobility, previous h/o DVT/PE, therefore PE unlikely at this time given reported history. -Troponins x 3 -ve -Repeat EKG w/ no ischemic changes -ECHO in 11/2012 shows  EF of 60-65%, no wall motion abnormalities. -Continue ASA 81 mg qd, Lipitor 40 mg qd, and Atenolol 12.5 mg qd  -Nitroglycerin sublingual prn -Patient w/ TIMI 3, therefore intermediate risk. Arranged for outpatient Myoview with Cardiology on discharge. Will arrange for close follow up with PCP.  HTN- Patient with recent reported BP in the 160's/100's at home. On admission, BP 112/58, most recent 147/83.  -Continue home meds, Lisinopril 10 mg qd, Atenolol 12.5 mg qd   Gout- Stable at this time  -Continue Allopurinol   HLD- Stable at this time;  -Lipid panel in AM  -Continue Lipitor 40 mg qhs  Dispo: Disposition is deferred at this time, awaiting improvement of current medical problems.  Anticipated discharge today.   The patient does have a current PCP Ernestina Penna, MD) and does need an [facility] hospital follow-up appointment after discharge.  The patient does not have transportation limitations that hinder transportation to clinic appointments.  .Services Needed at time of discharge: Y = Yes, Blank = No PT:   OT:   RN:   Equipment:   Other:     LOS: 1 day   Courtney Paris, MD 03/19/2013, 12:13 PM

## 2013-03-19 NOTE — Discharge Summary (Signed)
Name: Mark Wilkinson MRN: [id number] DOB: Aug 01, 1927 79 y.o. PCP: Ernestina Penna, MD  Date of Admission: 03/18/2013 12:48 PM Date of Discharge: 03/19/2013 Attending Physician: No att. providers found  Discharge Diagnosis: 1. Chest pain- Occurs at rest, not associated with N/V, diaphoresis, SOB. EKG and trops -ve. Scheduled for outpatient myoview on 03/31/13 2. HTN 3. Gout 4. HLD  Discharge Medications:   Medication List         allopurinol 300 MG tablet  Commonly known as:  ZYLOPRIM  Take 300 mg by mouth daily.     aspirin 81 MG EC tablet  Take 1 tablet (81 mg total) by mouth daily.     atenolol 25 MG tablet  Commonly known as:  TENORMIN  Take 12.5 mg by mouth 2 (two) times daily.     atorvastatin 40 MG tablet  Commonly known as:  LIPITOR  Take 40 mg by mouth every evening.     esomeprazole 40 MG packet  Commonly known as:  NEXIUM  Take 40 mg by mouth daily before breakfast.     Fish Oil 1000 MG Caps  Take 1,000 mg by mouth 2 (two) times daily.     GLUCOSAMINE PO  Take 1 tablet by mouth 2 (two) times daily.     LUCENTIS 0.3 MG/0.05ML Soln  Generic drug:  Ranibizumab  Place 1 application into the left eye. Injection q 6-8 weeks     multivitamin with minerals Tabs tablet  Take 1 tablet by mouth daily.     niacin 500 MG CR tablet  Commonly known as:  NIASPAN  Take 1,000 mg by mouth at bedtime.     quinapril 10 MG tablet  Commonly known as:  ACCUPRIL  Take 10 mg by mouth daily as needed (Hypertension).     SUPER B COMPLEX PO  Take 1 tablet by mouth daily.     timolol 0.5 % ophthalmic solution  Commonly known as:  BETIMOL  Place 1 drop into the left eye 2 (two) times daily.     Vitamin D3 1000 UNITS Caps  Take 2,000 Units by mouth daily.        Disposition and follow-up:   Mark Wilkinson was discharged from [facility] in Good condition.  At the hospital follow up visit please address:  1.  Please address chest pain/shoulder  pain.  2.  Labs / imaging needed at time of follow-up: none  3.  Pending labs/ test needing follow-up: none  Follow-up Appointments:     Follow-up Information   Follow up with Rudi Heap, MD On 03/24/2013. (8:00 AM)    Specialty:  Family Medicine   Contact information:   [address] [place] [address] [phone]       Follow up with [facility] Main Office [facility]). (12:00 PM for Myoview stress test)    Specialty:  Cardiology   Contact information:   [address], [address] [place] [address] [phone]      Discharge Instructions: Discharge Orders   Future Appointments Provider Department Dept Phone   03/24/2013 8:00 AM Ernestina Penna, MD [facility] Family Medicine [phone]   03/31/2013 12:00 PM [facility]) [facility] SITE 3 NUCLEAR MED [phone]   06/16/2013 9:30 AM Ernestina Penna, MD [facility] Family Medicine [phone]   Future Orders Complete By Expires   Call MD for:  difficulty breathing, headache or visual disturbances  As directed  Call MD for:  extreme fatigue  As directed    Call MD for:  persistant dizziness or light-headedness  As directed    Call MD for:  persistant nausea and vomiting  As directed    Call MD for:  redness, tenderness, or signs of infection (pain, swelling, redness, odor or green/yellow discharge around incision site)  As directed    Diet - low sodium heart healthy  As directed    Increase activity slowly  As directed       Consultations:    Procedures Performed:  Dg Chest 2 View  03/18/2013   CLINICAL DATA:  79 year old male with left chest pain. Initial encounter.  EXAM: CHEST  2 VIEW  COMPARISON:  12/11/2012 and earlier.  FINDINGS: Stable elevation of the right hemidiaphragm. Larger lung volumes today. Normal cardiac size and mediastinal contours. Visualized tracheal air column is within normal limits. No pneumothorax or pulmonary edema. No  pleural effusion or confluent pulmonary opacity. Stable visualized osseous structures. Right upper quadrant surgical clips re- identified.  IMPRESSION: Stable. No acute cardiopulmonary abnormality.   Electronically Signed   By: Augusto Gamble M.D.   On: 03/18/2013 13:54   2D Echo: 12/12/12 Study Conclusions: - Left ventricle: There was mild concentric hypertrophy. Systolic function was normal. The estimated ejection fraction was in the range of 60% to 65%. Wall motion was normal; there were no regional wall motion abnormalities. There was an increased relative contribution of atrial contraction to ventricular filling. Doppler parameters are consistent with abnormal left ventricular relaxation (grade 1 diastolic dysfunction). Doppler parameters are consistent with high ventricular filling pressure. - Aortic valve: Mildly thickened, mildly calcified leaflets, without stenosis. - Left atrium: Mildly dilated.  Admission HPI:  Mark Wilkinson is a 79 y.o. male w/ PMHx of HTN, HLD, gout, OA (s/p R. Knee replacement), and dementia, presents to the ED w/ complaints of chest pain and left shoulder pain since last evening. The patient has h/o dementia, most of the history was given by his wife. The patient had reported left shoulder pain last night, over his scapula with some elevated BP in the 160's/100's. The pain presented when he was sitting in his chair and subsided on its own. This AM, the patient had the same reported left shoulder pain but then with accompanying chest pain that the patient describes as "discomfort" rather than pain. This discomfort was also present at rest while sitting in his chair as well, and was not worsened by movement or deep inspiration. This pain was relieved by NTG. He does not describe any accompanying diaphoresis, nausea, vomiting, SOB, dizziness, lightheadedness, or palpitations. No history of previous DVT/PE, no recent travel or immobilization. No history of CAD, or cardiac  catheterization in the past.   Hospital Course by problem list:   1. Chest pain- Patient with reported shoulder pain and chest pain at rest. Chest pain described as a "discomfort" at rest, relieved by NTG at home. No previous history of CAD. EKG shows no acute ischemic changes. Troponins were cycled, -ve x 3.. Given no obvious EKG changes and normal troponins, most likely MSK, however, ACS must be ruled out. Patient was admitted to telemetry. Repeat EKG in the AM showed no dynamic changes. Previous ECHO in July showed EF of 60-65% w/ no wall motion abnormalities. At this time, it was thought that this was not d/t cardiac issues, however, the patient has a TIMI score of 3, making him intermediate risk. The patient was scheduled for outpatient  myoview to rule out ischemic causes. He was continued on ASA 81 mg qd, Lipitor 40 mg qd, and Atenolol 12.5 mg qd. HbA1c, lipid panel, and TSH were all well within normal limits. Patient was discharged home with close follow up with PCP Dr. Rudi Heap.   2. HTN- Patient with reported BP in the 160's/100's at home. On admission, BP 112/58, and remained stable throughout admission. Continued home meds, Lisinopril 10 mg qd, Atenolol 12.5 mg qd.   3. Gout- Stable during hospitalization. Continued Allopurinol.  4. HLD- Stable during admission. Lipid panel showed cholesterol of 117, TGs of 67, HDL of 48, and LDL of 56. Continued Lipitor 40 mg qhs  Discharge Vitals:   BP 97/64  Pulse 71  Temp(Src) 97.7 F (36.5 C) (Oral)  Resp 17  Ht 5\' 9"  (1.753 m)  Wt 211 lb 1.6 oz (95.754 kg)  BMI 31.16 kg/m2  SpO2 97%  Discharge Labs:  Results for orders placed during the hospital encounter of 03/18/13 (from the past 24 hour(s))  CBC     Status: Abnormal   Collection Time    03/18/13  7:15 PM      Result Value Range   WBC 7.7  4.0 - 10.5 K/uL   RBC 4.39  4.22 - 5.81 MIL/uL   Hemoglobin 12.8 (*) 13.0 - 17.0 g/dL   HCT 16.1 (*) 09.6 - 04.5 %   MCV 87.2  78.0 - 100.0  fL   MCH 29.2  26.0 - 34.0 pg   MCHC 33.4  30.0 - 36.0 g/dL   RDW 40.9  81.1 - 91.4 %   Platelets 124 (*) 150 - 400 K/uL  CREATININE, SERUM     Status: Abnormal   Collection Time    03/18/13  7:15 PM      Result Value Range   Creatinine, Ser 0.81  0.50 - 1.35 mg/dL   GFR calc non Af Amer 80 (*) >90 mL/min   GFR calc Af Amer >90  >90 mL/min  TROPONIN I     Status: None   Collection Time    03/18/13  7:15 PM      Result Value Range   Troponin I <0.30  <0.30 ng/mL  TROPONIN I     Status: None   Collection Time    03/18/13 11:45 PM      Result Value Range   Troponin I <0.30  <0.30 ng/mL  TROPONIN I     Status: None   Collection Time    03/19/13  6:12 AM      Result Value Range   Troponin I <0.30  <0.30 ng/mL  CBC     Status: Abnormal   Collection Time    03/19/13  6:12 AM      Result Value Range   WBC 9.2  4.0 - 10.5 K/uL   RBC 4.79  4.22 - 5.81 MIL/uL   Hemoglobin 13.9  13.0 - 17.0 g/dL   HCT 78.2  95.6 - 21.3 %   MCV 86.8  78.0 - 100.0 fL   MCH 29.0  26.0 - 34.0 pg   MCHC 33.4  30.0 - 36.0 g/dL   RDW 08.6  57.8 - 46.9 %   Platelets 135 (*) 150 - 400 K/uL  HEMOGLOBIN A1C     Status: None   Collection Time    03/19/13  6:12 AM      Result Value Range   Hemoglobin A1C 5.6  <5.7 %   Mean Plasma Glucose  114  <117 mg/dL  TSH     Status: None   Collection Time    03/19/13  6:12 AM      Result Value Range   TSH 1.030  0.350 - 4.500 uIU/mL  LIPID PANEL     Status: None   Collection Time    03/19/13  6:12 AM      Result Value Range   Cholesterol 117  0 - 200 mg/dL   Triglycerides 67  <161 mg/dL   HDL 48  >09 mg/dL   Total CHOL/HDL Ratio 2.4     VLDL 13  0 - 40 mg/dL   LDL Cholesterol 56  0 - 99 mg/dL    Signed: Courtney Paris, MD 03/19/2013, 7:02 PM   Time Spent on Discharge: 35 minutes Services Ordered on Discharge: none Equipment Ordered on Discharge: none

## 2013-03-19 NOTE — Progress Notes (Signed)
Utilization review completed.  

## 2013-03-22 ENCOUNTER — Other Ambulatory Visit: Payer: Self-pay | Admitting: Family Medicine

## 2013-03-24 ENCOUNTER — Ambulatory Visit (INDEPENDENT_AMBULATORY_CARE_PROVIDER_SITE_OTHER): Payer: Medicare Other | Admitting: Family Medicine

## 2013-03-24 ENCOUNTER — Encounter: Payer: Self-pay | Admitting: Family Medicine

## 2013-03-24 VITALS — BP 162/91 | HR 67 | Temp 97.0°F | Ht 69.0 in | Wt 207.0 lb

## 2013-03-24 DIAGNOSIS — R079 Chest pain, unspecified: Secondary | ICD-10-CM

## 2013-03-24 DIAGNOSIS — E785 Hyperlipidemia, unspecified: Secondary | ICD-10-CM

## 2013-03-24 DIAGNOSIS — Z23 Encounter for immunization: Secondary | ICD-10-CM | POA: Diagnosis not present

## 2013-03-24 DIAGNOSIS — E559 Vitamin D deficiency, unspecified: Secondary | ICD-10-CM | POA: Diagnosis not present

## 2013-03-24 DIAGNOSIS — I10 Essential (primary) hypertension: Secondary | ICD-10-CM | POA: Diagnosis not present

## 2013-03-24 NOTE — Patient Instructions (Signed)
Continue current medications Always be careful and don't put yourself at risk for falling We will give you an additional pneumonia shot today, Prevnar With echocardiogram as planned Review blood pressure readings in 2-3 week

## 2013-03-24 NOTE — Progress Notes (Signed)
Subjective:    Patient ID: Mark Wilkinson, male    DOB: [birth year], 79 y.o.   MRN: [id number]  HPI Patient here today for hospital follow up from [facility] on 03-18-13 for chest pain. The patient presented to our office and was sent to the hospital because of left shoulder pain and chest pain. The enzymes in the hospital were negative. He was discharged and went home and did have one further episode of chest and shoulder pain at home. Since that time he has been doing well with no complaints. He does have a followup for an echocardiogram in [place]. The wife brought in blood pressures for reviewed and most of these have been good except a couple have been elevated in the morning. The initial reading here today was elevated but a repeat blood pressure was 116/84 in the right arm.     Patient Active Problem List   Diagnosis Date Noted  . Chest pain 03/18/2013  . Acute encephalopathy 12/11/2012  . Hypotension, unspecified 12/11/2012  . Elevated troponin 12/11/2012  . Expected blood loss anemia 12/09/2012  . Obese 12/09/2012  . S/P right TKA 12/08/2012  . Preop cardiovascular exam 11/25/2012  . Essential hypertension, benign   . Other and unspecified hyperlipidemia   . Diaphragmatic hernia without mention of obstruction or gangrene   . Gout, unspecified   . Calculus of kidney   . [place], illinios [id number]   . Asymptomatic varicose veins   . Lumbago    Outpatient Encounter Prescriptions as of 03/24/2013  Medication Sig Dispense Refill  . allopurinol (ZYLOPRIM) 300 MG tablet TAKE 1 TABLET DAILY  90 tablet  0  . aspirin EC 81 MG EC tablet Take 1 tablet (81 mg total) by mouth daily.  30 tablet  3  . atenolol (TENORMIN) 25 MG tablet Take 12.5 mg by mouth 2 (two) times daily.       Marland Kitchen atorvastatin (LIPITOR) 40 MG tablet Take 40 mg by mouth every evening.       . B Complex-C (SUPER B COMPLEX PO) Take 1 tablet by mouth daily.      . Cholecalciferol (VITAMIN D3) 1000 UNITS CAPS Take 2,000 Units by  mouth daily.       Marland Kitchen esomeprazole (NEXIUM) 40 MG packet Take 40 mg by mouth daily before breakfast.      . GLUCOSAMINE PO Take 1 tablet by mouth 2 (two) times daily.      . Multiple Vitamin (MULTIVITAMIN WITH MINERALS) TABS Take 1 tablet by mouth daily.      . niacin (NIASPAN) 500 MG CR tablet Take 1,000 mg by mouth at bedtime.      . Omega-3 Fatty Acids (FISH OIL) 1000 MG CAPS Take 1,000 mg by mouth 2 (two) times daily.       . quinapril (ACCUPRIL) 10 MG tablet Take 10 mg by mouth daily as needed (Hypertension).      . Ranibizumab (LUCENTIS) 0.3 MG/0.05ML SOLN Place 1 application into the left eye. Injection q 6-8 weeks      . timolol (BETIMOL) 0.5 % ophthalmic solution Place 1 drop into the left eye 2 (two) times daily.      . [DISCONTINUED] allopurinol (ZYLOPRIM) 300 MG tablet Take 300 mg by mouth daily.      . [DISCONTINUED] nitroGLYCERIN (NITROSTAT) SL tablet 0.4 mg        No facility-administered encounter medications on file as of 03/24/2013.    Review of Systems  Constitutional: Negative.   HENT: Negative.  Eyes: Negative.   Respiratory: Negative.   Cardiovascular: Negative.   Gastrointestinal: Negative.   Endocrine: Negative.   Genitourinary: Negative.   Musculoskeletal: Negative.   Skin: Negative.   Allergic/Immunologic: Negative.   Neurological: Negative.   Hematological: Negative.   Psychiatric/Behavioral: Negative.        Objective:   Physical Exam  Nursing note and vitals reviewed. Constitutional: He is oriented to person, place, and time. He appears well-developed and well-nourished. No distress.  Somewhat kyphotic  HENT:  Head: Normocephalic and atraumatic.  Eyes: Conjunctivae and EOM are normal. Right eye exhibits no discharge. Left eye exhibits no discharge. No scleral icterus.  Neck: Normal range of motion. Neck supple. No tracheal deviation present. No thyromegaly present.  No bruits in the neck  Cardiovascular: Normal rate, regular rhythm, normal heart  sounds and intact distal pulses.  Exam reveals no gallop and no friction rub.   No murmur heard. At 72 per minute  Pulmonary/Chest: Effort normal and breath sounds normal. No respiratory distress. He has no wheezes. He has no rales. He exhibits no tenderness.  Abdominal: Soft. Bowel sounds are normal. He exhibits no mass. There is no tenderness. There is no rebound and no guarding.  Musculoskeletal: He exhibits no edema and no tenderness.  Somewhat hesitant range of motion do to kyphosis and recent knee replacement  Lymphadenopathy:    He has no cervical adenopathy.  Neurological: He is alert and oriented to person, place, and time. He has normal reflexes. No cranial nerve deficit.  Skin: Skin is warm and dry. No rash noted. No erythema. No pallor.  Psychiatric: He has a normal mood and affect. His behavior is normal. Judgment and thought content normal.   BP 162/91  Pulse 67  Temp(Src) 97 F (36.1 C) (Oral)  Ht 5\' 9"  (1.753 m)  Wt 207 lb (93.895 kg)  BMI 30.55 kg/m2        Assessment & Plan:    1. Chest pain, hospital followup   2. Hypertension   3. Hyperlipemia   4. Vitamin D deficiency    No orders of the defined types were placed in this encounter.   No orders of the defined types were placed in this encounter.   Patient Instructions  Continue current medications Always be careful and don't put yourself at risk for falling We will give you an additional pneumonia shot today, Prevnar With echocardiogram as planned Review blood pressure readings in 2-3 week   Nyra Capes MD

## 2013-03-25 ENCOUNTER — Other Ambulatory Visit: Payer: Self-pay | Admitting: Family Medicine

## 2013-03-25 NOTE — Discharge Summary (Signed)
  Date: 03/25/2013  Patient name: Mark Wilkinson  Medical record number: [id number]  Date of birth: November 22, 1927   This patient has been seen and the plan of care was discussed with the house staff. Please see their note for complete details. I concur with their findings and plan.  Jonah Blue, DO, FACP Faculty [facility] Internal Medicine Residency Program 03/25/2013, 7:03 PM

## 2013-03-31 ENCOUNTER — Encounter: Payer: Self-pay | Admitting: Cardiology

## 2013-03-31 ENCOUNTER — Encounter (HOSPITAL_COMMUNITY): Payer: Medicare Other

## 2013-04-02 ENCOUNTER — Other Ambulatory Visit (INDEPENDENT_AMBULATORY_CARE_PROVIDER_SITE_OTHER): Payer: Self-pay

## 2013-04-02 DIAGNOSIS — Z1212 Encounter for screening for malignant neoplasm of rectum: Secondary | ICD-10-CM | POA: Diagnosis not present

## 2013-04-02 NOTE — Progress Notes (Signed)
Pt dropped off stool sample only FOBT

## 2013-04-06 DIAGNOSIS — M5137 Other intervertebral disc degeneration, lumbosacral region: Secondary | ICD-10-CM | POA: Diagnosis not present

## 2013-04-06 DIAGNOSIS — M999 Biomechanical lesion, unspecified: Secondary | ICD-10-CM | POA: Diagnosis not present

## 2013-04-06 DIAGNOSIS — M9981 Other biomechanical lesions of cervical region: Secondary | ICD-10-CM | POA: Diagnosis not present

## 2013-04-06 DIAGNOSIS — M25569 Pain in unspecified knee: Secondary | ICD-10-CM | POA: Diagnosis not present

## 2013-04-06 DIAGNOSIS — I10 Essential (primary) hypertension: Secondary | ICD-10-CM | POA: Diagnosis not present

## 2013-04-06 LAB — FECAL OCCULT BLOOD, IMMUNOCHEMICAL: Fecal Occult Bld: NEGATIVE

## 2013-04-12 DIAGNOSIS — H348192 Central retinal vein occlusion, unspecified eye, stable: Secondary | ICD-10-CM | POA: Diagnosis not present

## 2013-04-12 DIAGNOSIS — H3581 Retinal edema: Secondary | ICD-10-CM | POA: Diagnosis not present

## 2013-04-13 ENCOUNTER — Other Ambulatory Visit: Payer: Self-pay | Admitting: Family Medicine

## 2013-04-14 ENCOUNTER — Ambulatory Visit (HOSPITAL_COMMUNITY): Payer: Medicare Other | Attending: Family Medicine | Admitting: Radiology

## 2013-04-14 VITALS — BP 136/94 | HR 58 | Ht 69.0 in | Wt 206.0 lb

## 2013-04-14 DIAGNOSIS — R079 Chest pain, unspecified: Secondary | ICD-10-CM | POA: Diagnosis not present

## 2013-04-14 DIAGNOSIS — R0789 Other chest pain: Secondary | ICD-10-CM | POA: Insufficient documentation

## 2013-04-14 DIAGNOSIS — M25519 Pain in unspecified shoulder: Secondary | ICD-10-CM | POA: Diagnosis not present

## 2013-04-14 DIAGNOSIS — I10 Essential (primary) hypertension: Secondary | ICD-10-CM | POA: Insufficient documentation

## 2013-04-14 DIAGNOSIS — E785 Hyperlipidemia, unspecified: Secondary | ICD-10-CM | POA: Insufficient documentation

## 2013-04-14 DIAGNOSIS — Z87891 Personal history of nicotine dependence: Secondary | ICD-10-CM | POA: Diagnosis not present

## 2013-04-14 DIAGNOSIS — Z8249 Family history of ischemic heart disease and other diseases of the circulatory system: Secondary | ICD-10-CM | POA: Insufficient documentation

## 2013-04-14 MED ORDER — REGADENOSON 0.4 MG/5ML IV SOLN
0.4000 mg | Freq: Once | INTRAVENOUS | Status: AC
  Administered 2013-04-14: 0.4 mg via INTRAVENOUS

## 2013-04-14 MED ORDER — TECHNETIUM TC 99M SESTAMIBI GENERIC - CARDIOLITE
30.0000 | Freq: Once | INTRAVENOUS | Status: AC | PRN
  Administered 2013-04-14: 30 via INTRAVENOUS

## 2013-04-14 MED ORDER — TECHNETIUM TC 99M SESTAMIBI GENERIC - CARDIOLITE
10.0000 | Freq: Once | INTRAVENOUS | Status: AC | PRN
  Administered 2013-04-14: 10 via INTRAVENOUS

## 2013-04-14 NOTE — Progress Notes (Signed)
[facility] SITE 3 NUCLEAR MED [address] [place], [place] [address] [phone]    Cardiology Nuclear Med Study  Mark Wilkinson is a 79 y.o. male     MRN : [id number]     DOB: [birth year]  Procedure Date: 04/14/2013  Nuclear Med Background Indication for Stress Test:  Evaluation for Ischemia and Patient seen in hospital on 03-18-13 for chest pain, (L) shoulder pain, enzymes negative History:  Echo 2014 EF 60-65% Cardiac Risk Factors: Family History - CAD, History of Smoking, Hypertension and Lipids  Symptoms:  Chest Pain (last date of chest discomfort 03/21/13)   Nuclear Pre-Procedure Caffeine/Decaff Intake:  None > 12 hrs NPO After: 9:00pm   Lungs:  clear O2 Sat: 94% on room air. IV 0.9% NS with Angio Cath:  22g  IV Site: R Wrist x 1, tolerated well IV Started by:  Irean Hong, RN  Chest Size (in):  44 Cup Size: n/a  Height: 5\' 9"  (1.753 m)  Weight:  206 lb (93.441 kg)  BMI:  Body mass index is 30.41 kg/(m^2). Tech Comments:  Took morning medications    Nuclear Med Study 1 or 2 day study: 1 day  Stress Test Type:  Lexiscan  Reading MD: Kristeen Miss, MD  Order Authorizing Provider:  Dietrich Pates, MD, and Rudi Heap, MD  Resting Radionuclide: Technetium 49m Sestamibi  Resting Radionuclide Dose: 11.0 mCi   Stress Radionuclide:  Technetium 79m Sestamibi  Stress Radionuclide Dose: 32.8 mCi           Stress Protocol Rest HR: 58 Stress HR: 83  Rest BP: 136/94 Stress BP: 150/92  Exercise Time (min): n/a METS: n/a   Predicted Max HR: 137 bpm % Max HR: 60.58 bpm Rate Pressure Product: 46962   Dose of Adenosine (mg):  n/a Dose of Lexiscan: 0.4 mg  Dose of Atropine (mg): n/a Dose of Dobutamine: n/a mcg/kg/min (at max HR)  Stress Test Technologist: Nelson Chimes, BS-ES  Nuclear Technologist:  Domenic Polite, CNMT     Rest Procedure:  Myocardial perfusion imaging was performed at rest 45 minutes following the intravenous administration of Technetium 62m  Sestamibi. Rest ECG: NSR - Normal EKG  Stress Procedure:  The patient received IV Lexiscan 0.4 mg over 15-seconds.  Technetium 73m Sestamibi injected at 30-seconds.  Quantitative spect images were obtained after a 45 minute delay. Patient did not have any symptoms with the infusion of Lexiscan.   Stress ECG: No significant change from baseline ECG  QPS Raw Data Images:  Normal; no motion artifact; normal heart/lung ratio. Stress Images:  Normal homogeneous uptake in all areas of the myocardium. Rest Images:  Normal homogeneous uptake in all areas of the myocardium. Subtraction (SDS):  No evidence of ischemia. Transient Ischemic Dilatation (Normal <1.22):  0.94 Lung/Heart Ratio (Normal <0.45):  0.29  Quantitative Gated Spect Images QGS EDV:  85 ml QGS ESV:  30 ml  Impression Exercise Capacity:  Lexiscan with no exercise. BP Response:  Normal blood pressure response. Clinical Symptoms:  No significant symptoms noted. ECG Impression:  No significant ST segment change suggestive of ischemia. Comparison with Prior Nuclear Study: No images to compare  Overall Impression:  Normal stress nuclear study.  LV Ejection Fraction: 65%.  LV Wall Motion:  NL LV Function; NL Wall Motion.   Vesta Mixer, Montez Hageman., MD, [facility] 04/14/2013, 5:25 PM Office - [phone] Pager [phone]

## 2013-04-14 NOTE — Telephone Encounter (Signed)
EMR states that he takes 12.5mg  BID. This prescription is for 50mg  take 1.5 tablets daily. Dosage needs to be clarified.

## 2013-04-16 ENCOUNTER — Telehealth: Payer: Self-pay | Admitting: Cardiology

## 2013-04-16 NOTE — Telephone Encounter (Signed)
Notified of stress tests results.

## 2013-04-16 NOTE — Telephone Encounter (Signed)
Routing msg to [employer]

## 2013-04-16 NOTE — Telephone Encounter (Signed)
New Problem:  Pt's wife states she is returning Anita's phone call for test results. Please advise

## 2013-05-10 ENCOUNTER — Other Ambulatory Visit: Payer: Self-pay | Admitting: Family Medicine

## 2013-05-25 DIAGNOSIS — H3581 Retinal edema: Secondary | ICD-10-CM | POA: Diagnosis not present

## 2013-05-25 DIAGNOSIS — H348192 Central retinal vein occlusion, unspecified eye, stable: Secondary | ICD-10-CM | POA: Diagnosis not present

## 2013-06-01 DIAGNOSIS — M5430 Sciatica, unspecified side: Secondary | ICD-10-CM | POA: Diagnosis not present

## 2013-06-01 DIAGNOSIS — M999 Biomechanical lesion, unspecified: Secondary | ICD-10-CM | POA: Diagnosis not present

## 2013-06-01 DIAGNOSIS — M25569 Pain in unspecified knee: Secondary | ICD-10-CM | POA: Diagnosis not present

## 2013-06-01 DIAGNOSIS — I10 Essential (primary) hypertension: Secondary | ICD-10-CM | POA: Diagnosis not present

## 2013-06-16 ENCOUNTER — Encounter: Payer: Self-pay | Admitting: Family Medicine

## 2013-06-16 ENCOUNTER — Ambulatory Visit (INDEPENDENT_AMBULATORY_CARE_PROVIDER_SITE_OTHER): Payer: Medicare Other | Admitting: Family Medicine

## 2013-06-16 VITALS — BP 156/88 | HR 60 | Temp 97.0°F | Ht 69.0 in | Wt 211.0 lb

## 2013-06-16 DIAGNOSIS — I10 Essential (primary) hypertension: Secondary | ICD-10-CM | POA: Diagnosis not present

## 2013-06-16 DIAGNOSIS — R350 Frequency of micturition: Secondary | ICD-10-CM | POA: Diagnosis not present

## 2013-06-16 DIAGNOSIS — D500 Iron deficiency anemia secondary to blood loss (chronic): Secondary | ICD-10-CM

## 2013-06-16 DIAGNOSIS — E559 Vitamin D deficiency, unspecified: Secondary | ICD-10-CM | POA: Diagnosis not present

## 2013-06-16 DIAGNOSIS — N400 Enlarged prostate without lower urinary tract symptoms: Secondary | ICD-10-CM | POA: Insufficient documentation

## 2013-06-16 DIAGNOSIS — E785 Hyperlipidemia, unspecified: Secondary | ICD-10-CM

## 2013-06-16 DIAGNOSIS — Z0000 Encounter for general adult medical examination without abnormal findings: Secondary | ICD-10-CM

## 2013-06-16 NOTE — Progress Notes (Signed)
Subjective:    Patient ID: Mark Wilkinson, male    DOB: 1927-10-03, 80 y.o.   MRN: [id number]  HPI Pt here for follow up and management of chronic medical problems. The patient comes to the visit today with his wife.  His knee replacement was done in July. He is doing well with this. He does not do a lot of walking. His biggest complaint is that he urinates frequently. He was given a prescription for rapaflow but has not started this. Home blood pressures have been running in the 140s over the 80s according to his wife. He will have lab work drawn today. His annual exam will be done today.       Patient Active Problem List   Diagnosis Date Noted  . Chest pain 03/18/2013  . Acute encephalopathy 12/11/2012  . Hypotension, unspecified 12/11/2012  . Elevated troponin 12/11/2012  . Expected blood loss anemia 12/09/2012  . Obese 12/09/2012  . S/P right TKA 12/08/2012  . Preop cardiovascular exam 11/25/2012  . Essential hypertension, benign   . Other and unspecified hyperlipidemia   . Diaphragmatic hernia without mention of obstruction or gangrene   . Gout, unspecified   . Calculus of kidney   . [place], [place]   . Asymptomatic varicose veins   . Lumbago    Outpatient Encounter Prescriptions as of 06/16/2013  Medication Sig  . allopurinol (ZYLOPRIM) 300 MG tablet TAKE 1 TABLET DAILY  . aspirin EC 81 MG EC tablet Take 1 tablet (81 mg total) by mouth daily.  Marland Kitchen atenolol (TENORMIN) 25 MG tablet Take 25 mg by mouth 2 (two) times daily.   Marland Kitchen atorvastatin (LIPITOR) 40 MG tablet TAKE 1 TABLET BY MOUTH EVERY DAY  . B Complex-C (SUPER B COMPLEX PO) Take 1 tablet by mouth daily.  . Cholecalciferol (VITAMIN D3) 1000 UNITS CAPS Take 2,000 Units by mouth daily.   Marland Kitchen esomeprazole (NEXIUM) 40 MG packet Take 40 mg by mouth daily before breakfast.  . GLUCOSAMINE PO Take 1 tablet by mouth 2 (two) times daily.  . meloxicam (MOBIC) 7.5 MG tablet Take 7.5 mg by mouth daily as needed for pain.  .  Multiple Vitamin (MULTIVITAMIN WITH MINERALS) TABS Take 1 tablet by mouth daily.  . niacin (NIASPAN) 500 MG CR tablet Take 1,000 mg by mouth at bedtime.  . Omega-3 Fatty Acids (FISH OIL) 1000 MG CAPS Take 1,000 mg by mouth 2 (two) times daily.   . quinapril (ACCUPRIL) 10 MG tablet TAKE 1 TABLET EVERY DAY  . Ranibizumab (LUCENTIS) 0.3 MG/0.05ML SOLN Place 1 application into the left eye. Injection q 6-8 weeks  . timolol (BETIMOL) 0.5 % ophthalmic solution Place 1 drop into the left eye 2 (two) times daily.  . [DISCONTINUED] atenolol (TENORMIN) 50 MG tablet TAKE 1 & 1/2 TABLETS BY MOUTH EVERY DAY.  . [DISCONTINUED] quinapril (ACCUPRIL) 10 MG tablet Take 10 mg by mouth daily as needed (Hypertension).    Review of Systems  Constitutional: Negative.   HENT: Negative.   Eyes: Negative.   Respiratory: Negative.   Cardiovascular: Negative.   Gastrointestinal: Negative.   Endocrine: Negative.   Genitourinary: Negative.   Musculoskeletal: Negative.   Skin: Negative.   Allergic/Immunologic: Negative.   Neurological: Negative.   Hematological: Negative.   Psychiatric/Behavioral: Negative.        Objective:   Physical Exam  Nursing note and vitals reviewed. Constitutional: He is oriented to person, place, and time. He appears well-developed and well-nourished. No distress.  Patient  is calm and looks good for his age. He is using a cane  HENT:  Head: Normocephalic and atraumatic.  Right Ear: External ear normal.  Left Ear: External ear normal.  Nose: Nose normal.  Mouth/Throat: Oropharynx is clear and moist. No oropharyngeal exudate.  Eyes: Conjunctivae and EOM are normal. Pupils are equal, round, and reactive to light. Right eye exhibits no discharge. Left eye exhibits no discharge. No scleral icterus.  Neck: Normal range of motion. Neck supple. No thyromegaly present.  There were no carotid bruits audible  Cardiovascular: Normal rate, regular rhythm, normal heart sounds and intact  distal pulses.  Exam reveals no gallop and no friction rub.   No murmur heard. At 72 per minute  Pulmonary/Chest: Effort normal and breath sounds normal. No respiratory distress. He has no wheezes. He has no rales. He exhibits no tenderness.  Abdominal: Soft. Bowel sounds are normal. He exhibits no mass. There is no tenderness. There is no rebound and no guarding.  Genitourinary: Rectum normal and penis normal.  The prostate gland is very enlarged but soft and smooth. There were no rectal masses. There is no inguinal hernia present. The external genitalia were within normal limits.  Musculoskeletal: Normal range of motion. He exhibits no edema and no tenderness.  Range of motion and walking is somewhat limited due to recent knee replacement and arthritis in the other knee. He also has a history of osteoarthritis in his back.  Lymphadenopathy:    He has no cervical adenopathy.  Neurological: He is alert and oriented to person, place, and time. No cranial nerve deficit.  Lower extremity DTRs were diminished bilaterally.  Skin: Skin is warm and dry. No rash noted. No erythema. No pallor.  Psychiatric: He has a normal mood and affect. His behavior is normal. Judgment and thought content normal.   BP 156/88  Pulse 60  Temp(Src) 97 F (36.1 C) (Oral)  Ht _0  (1.753 m)  Wt 211 lb (95.709 kg)  BMI 31.15 kg/m2 Repeat blood pressure was 148/90 in the left arm       Assessment & Plan:  1. Essential hypertension, benign - BMP8+EGFR - Hepatic function panel - NMR, lipoprofile  2. Expected blood loss anemia - CBC With differential/Platelet  3. Vitamin D deficiency - Vit D  25 hydroxy (rtn osteoporosis monitoring)  4. Annual physical exam  5. Urinary frequency  6. Other and unspecified hyperlipidemia  7. BPH (benign prostatic hyperplasia)   Patient Instructions  Continue current medications. Continue good therapeutic lifestyle changes which include good diet and exercise. Fall  precautions discussed with patient. Schedule your flu vaccine if you haven't had it yet If you are over 79 years old - you may need Prevnar 65 or the adult Pneumonia vaccine. Start the rapaflow for voiding frequency Bring blood pressuresby for review in 2-3 weeks and at that time we will decide if we need to increase the atenolol back to 50 mg at bedtime   Arrie Senate MD

## 2013-06-16 NOTE — Patient Instructions (Addendum)
Continue current medications. Continue good therapeutic lifestyle changes which include good diet and exercise. Fall precautions discussed with patient. Schedule your flu vaccine if you haven't had it yet If you are over 78 years old - you may need Prevnar 80 or the adult Pneumonia vaccine. Start the rapaflow for voiding frequency Bring blood pressuresby for review in 2-3 weeks and at that time we will decide if we need to increase the atenolol back to 50 mg at bedtime

## 2013-06-17 LAB — BMP8+EGFR
BUN/Creatinine Ratio: 8 — ABNORMAL LOW (ref 10–22)
BUN: 7 mg/dL — ABNORMAL LOW (ref 8–27)
CO2: 31 mmol/L — ABNORMAL HIGH (ref 18–29)
CREATININE: 0.89 mg/dL (ref 0.76–1.27)
Calcium: 9.7 mg/dL (ref 8.6–10.2)
Chloride: 103 mmol/L (ref 97–108)
GFR calc Af Amer: 91 mL/min/{1.73_m2} (ref >59)
GFR, EST NON AFRICAN AMERICAN: 79 mL/min/{1.73_m2} (ref >59)
Glucose: 114 mg/dL — ABNORMAL HIGH (ref 65–99)
Potassium: 5.1 mmol/L (ref 3.5–5.2)
SODIUM: 145 mmol/L — AB (ref 134–144)

## 2013-06-17 LAB — VITAMIN D 25 HYDROXY (VIT D DEFICIENCY, FRACTURES): Vit D, 25-Hydroxy: 56.6 ng/mL (ref 30.0–100.0)

## 2013-06-17 LAB — HEPATIC FUNCTION PANEL
ALT: 11 IU/L (ref 0–44)
AST: 21 IU/L (ref 0–40)
Albumin: 4.2 g/dL (ref 3.5–4.7)
Alkaline Phosphatase: 92 IU/L (ref 39–117)
BILIRUBIN DIRECT: 0.18 mg/dL (ref 0.00–0.40)
BILIRUBIN TOTAL: 0.6 mg/dL (ref 0.0–1.2)
TOTAL PROTEIN: 6.9 g/dL (ref 6.0–8.5)

## 2013-06-17 LAB — CBC WITH DIFFERENTIAL
BASOS ABS: 0 10*3/uL (ref 0.0–0.2)
Basos: 0 %
Eos: 2 %
Eosinophils Absolute: 0.2 10*3/uL (ref 0.0–0.4)
HCT: 42.6 % (ref 37.5–51.0)
Hemoglobin: 14.9 g/dL (ref 12.6–17.7)
IMMATURE GRANULOCYTES: 0 %
Immature Grans (Abs): 0 10*3/uL (ref 0.0–0.1)
LYMPHS ABS: 2.7 10*3/uL (ref 0.7–3.1)
Lymphs: 30 %
MCH: 29.9 pg (ref 26.6–33.0)
MCHC: 35 g/dL (ref 31.5–35.7)
MCV: 85 fL (ref 79–97)
MONOS ABS: 0.6 10*3/uL (ref 0.1–0.9)
Monocytes: 6 %
Neutrophils Absolute: 5.6 10*3/uL (ref 1.4–7.0)
Neutrophils Relative %: 62 %
PLATELETS: 151 10*3/uL (ref 150–379)
RBC: 4.99 x10E6/uL (ref 4.14–5.80)
RDW: 14 % (ref 12.3–15.4)
WBC: 9 10*3/uL (ref 3.4–10.8)

## 2013-06-17 LAB — NMR, LIPOPROFILE
Cholesterol: 127 mg/dL (ref <200)
HDL Cholesterol by NMR: 45 mg/dL (ref >=40)
HDL Particle Number: 27.7 umol/L — ABNORMAL LOW (ref >=30.5)
LDL Particle Number: 755 nmol/L (ref <1000)
LDL SIZE: 20.7 nm (ref >20.5)
LDLC SERPL CALC-MCNC: 68 mg/dL (ref <100)
LP-IR Score: 33 (ref <=45)
SMALL LDL PARTICLE NUMBER: 378 nmol/L (ref <=527)
Triglycerides by NMR: 72 mg/dL (ref <150)

## 2013-06-17 LAB — SPECIMEN STATUS REPORT

## 2013-06-19 ENCOUNTER — Other Ambulatory Visit: Payer: Self-pay | Admitting: Family Medicine

## 2013-06-29 DIAGNOSIS — I10 Essential (primary) hypertension: Secondary | ICD-10-CM | POA: Diagnosis not present

## 2013-06-29 DIAGNOSIS — M25569 Pain in unspecified knee: Secondary | ICD-10-CM | POA: Diagnosis not present

## 2013-06-29 DIAGNOSIS — M999 Biomechanical lesion, unspecified: Secondary | ICD-10-CM | POA: Diagnosis not present

## 2013-06-29 DIAGNOSIS — M5430 Sciatica, unspecified side: Secondary | ICD-10-CM | POA: Diagnosis not present

## 2013-07-06 DIAGNOSIS — H348192 Central retinal vein occlusion, unspecified eye, stable: Secondary | ICD-10-CM | POA: Diagnosis not present

## 2013-07-06 DIAGNOSIS — H3581 Retinal edema: Secondary | ICD-10-CM | POA: Diagnosis not present

## 2013-07-11 ENCOUNTER — Other Ambulatory Visit: Payer: Self-pay | Admitting: Family Medicine

## 2013-07-12 ENCOUNTER — Other Ambulatory Visit: Payer: Self-pay | Admitting: Family Medicine

## 2013-07-23 ENCOUNTER — Other Ambulatory Visit: Payer: Self-pay | Admitting: Family Medicine

## 2013-07-27 DIAGNOSIS — M25569 Pain in unspecified knee: Secondary | ICD-10-CM | POA: Diagnosis not present

## 2013-07-27 DIAGNOSIS — M999 Biomechanical lesion, unspecified: Secondary | ICD-10-CM | POA: Diagnosis not present

## 2013-07-27 DIAGNOSIS — M5430 Sciatica, unspecified side: Secondary | ICD-10-CM | POA: Diagnosis not present

## 2013-07-27 DIAGNOSIS — I10 Essential (primary) hypertension: Secondary | ICD-10-CM | POA: Diagnosis not present

## 2013-08-08 ENCOUNTER — Other Ambulatory Visit: Payer: Self-pay | Admitting: Family Medicine

## 2013-08-17 DIAGNOSIS — H348192 Central retinal vein occlusion, unspecified eye, stable: Secondary | ICD-10-CM | POA: Diagnosis not present

## 2013-08-17 DIAGNOSIS — H3581 Retinal edema: Secondary | ICD-10-CM | POA: Diagnosis not present

## 2013-08-31 DIAGNOSIS — M25569 Pain in unspecified knee: Secondary | ICD-10-CM | POA: Diagnosis not present

## 2013-08-31 DIAGNOSIS — I10 Essential (primary) hypertension: Secondary | ICD-10-CM | POA: Diagnosis not present

## 2013-08-31 DIAGNOSIS — M5430 Sciatica, unspecified side: Secondary | ICD-10-CM | POA: Diagnosis not present

## 2013-08-31 DIAGNOSIS — M999 Biomechanical lesion, unspecified: Secondary | ICD-10-CM | POA: Diagnosis not present

## 2013-09-06 DIAGNOSIS — N200 Calculus of kidney: Secondary | ICD-10-CM | POA: Diagnosis not present

## 2013-09-06 DIAGNOSIS — N401 Enlarged prostate with lower urinary tract symptoms: Secondary | ICD-10-CM | POA: Diagnosis not present

## 2013-09-06 DIAGNOSIS — D4959 Neoplasm of unspecified behavior of other genitourinary organ: Secondary | ICD-10-CM | POA: Diagnosis not present

## 2013-09-06 DIAGNOSIS — N139 Obstructive and reflux uropathy, unspecified: Secondary | ICD-10-CM | POA: Diagnosis not present

## 2013-09-28 DIAGNOSIS — M5430 Sciatica, unspecified side: Secondary | ICD-10-CM | POA: Diagnosis not present

## 2013-09-28 DIAGNOSIS — M25569 Pain in unspecified knee: Secondary | ICD-10-CM | POA: Diagnosis not present

## 2013-09-28 DIAGNOSIS — M999 Biomechanical lesion, unspecified: Secondary | ICD-10-CM | POA: Diagnosis not present

## 2013-09-28 DIAGNOSIS — I10 Essential (primary) hypertension: Secondary | ICD-10-CM | POA: Diagnosis not present

## 2013-09-29 ENCOUNTER — Other Ambulatory Visit: Payer: Self-pay | Admitting: Family Medicine

## 2013-09-30 ENCOUNTER — Other Ambulatory Visit (INDEPENDENT_AMBULATORY_CARE_PROVIDER_SITE_OTHER): Payer: Medicare Other

## 2013-09-30 DIAGNOSIS — E785 Hyperlipidemia, unspecified: Secondary | ICD-10-CM

## 2013-09-30 DIAGNOSIS — I10 Essential (primary) hypertension: Secondary | ICD-10-CM

## 2013-09-30 DIAGNOSIS — E559 Vitamin D deficiency, unspecified: Secondary | ICD-10-CM

## 2013-09-30 LAB — POCT CBC
Granulocyte percent: 65.1 %G (ref 37–80)
HCT, POC: 43.1 % — AB (ref 43.5–53.7)
Hemoglobin: 13.7 g/dL — AB (ref 14.1–18.1)
Lymph, poc: 2.8 (ref 0.6–3.4)
MCH: 28.4 pg (ref 27–31.2)
MCHC: 31.9 g/dL (ref 31.8–35.4)
MCV: 89.1 fL (ref 80–97)
MPV: 9 fL (ref 0–99.8)
PLATELET COUNT, POC: 154 10*3/uL (ref 142–424)
POC Granulocyte: 5.7 (ref 2–6.9)
POC LYMPH PERCENT: 32.6 %L (ref 10–50)
RBC: 4.8 M/uL (ref 4.69–6.13)
RDW, POC: 14.7 %
WBC: 8.7 10*3/uL (ref 4.6–10.2)

## 2013-10-01 LAB — BMP8+EGFR
BUN/Creatinine Ratio: 13 (ref 10–22)
BUN: 12 mg/dL (ref 8–27)
CALCIUM: 9.5 mg/dL (ref 8.6–10.2)
CHLORIDE: 105 mmol/L (ref 97–108)
CO2: 28 mmol/L (ref 18–29)
Creatinine, Ser: 0.92 mg/dL (ref 0.76–1.27)
GFR calc Af Amer: 88 mL/min/{1.73_m2} (ref >59)
GFR calc non Af Amer: 76 mL/min/{1.73_m2} (ref >59)
Glucose: 119 mg/dL — ABNORMAL HIGH (ref 65–99)
POTASSIUM: 5.2 mmol/L (ref 3.5–5.2)
SODIUM: 144 mmol/L (ref 134–144)

## 2013-10-01 LAB — NMR, LIPOPROFILE
Cholesterol: 105 mg/dL (ref <200)
HDL Cholesterol by NMR: 42 mg/dL (ref >=40)
HDL Particle Number: 26.4 umol/L — ABNORMAL LOW (ref >=30.5)
LDL Particle Number: 482 nmol/L (ref <1000)
LDL Size: 21.3 nm (ref >20.5)
LDLC SERPL CALC-MCNC: 50 mg/dL (ref <100)
LP-IR Score: <25 (ref <=45)
SMALL LDL PARTICLE NUMBER: 208 nmol/L (ref <=527)
TRIGLYCERIDES BY NMR: 64 mg/dL (ref <150)

## 2013-10-01 LAB — HEPATIC FUNCTION PANEL
ALBUMIN: 3.8 g/dL (ref 3.5–4.7)
ALT: 13 IU/L (ref 0–44)
AST: 24 IU/L (ref 0–40)
Alkaline Phosphatase: 87 IU/L (ref 39–117)
Bilirubin, Direct: 0.17 mg/dL (ref 0.00–0.40)
TOTAL PROTEIN: 6.6 g/dL (ref 6.0–8.5)
Total Bilirubin: 0.5 mg/dL (ref 0.0–1.2)

## 2013-10-01 LAB — VITAMIN D 25 HYDROXY (VIT D DEFICIENCY, FRACTURES): Vit D, 25-Hydroxy: 68.8 ng/mL (ref 30.0–100.0)

## 2013-10-05 DIAGNOSIS — H35359 Cystoid macular degeneration, unspecified eye: Secondary | ICD-10-CM | POA: Diagnosis not present

## 2013-10-05 DIAGNOSIS — H3581 Retinal edema: Secondary | ICD-10-CM | POA: Diagnosis not present

## 2013-10-05 DIAGNOSIS — H348192 Central retinal vein occlusion, unspecified eye, stable: Secondary | ICD-10-CM | POA: Diagnosis not present

## 2013-10-11 ENCOUNTER — Ambulatory Visit (INDEPENDENT_AMBULATORY_CARE_PROVIDER_SITE_OTHER): Payer: Medicare Other | Admitting: Family Medicine

## 2013-10-11 ENCOUNTER — Encounter: Payer: Self-pay | Admitting: Family Medicine

## 2013-10-11 VITALS — BP 134/80 | HR 57 | Temp 96.8°F | Ht 69.0 in | Wt 208.0 lb

## 2013-10-11 DIAGNOSIS — I10 Essential (primary) hypertension: Secondary | ICD-10-CM | POA: Diagnosis not present

## 2013-10-11 DIAGNOSIS — M1711 Unilateral primary osteoarthritis, right knee: Secondary | ICD-10-CM

## 2013-10-11 DIAGNOSIS — IMO0002 Reserved for concepts with insufficient information to code with codable children: Secondary | ICD-10-CM

## 2013-10-11 DIAGNOSIS — E8881 Metabolic syndrome: Secondary | ICD-10-CM

## 2013-10-11 DIAGNOSIS — M1710 Unilateral primary osteoarthritis, unspecified knee: Secondary | ICD-10-CM

## 2013-10-11 DIAGNOSIS — N400 Enlarged prostate without lower urinary tract symptoms: Secondary | ICD-10-CM

## 2013-10-11 DIAGNOSIS — R413 Other amnesia: Secondary | ICD-10-CM

## 2013-10-11 DIAGNOSIS — E785 Hyperlipidemia, unspecified: Secondary | ICD-10-CM | POA: Diagnosis not present

## 2013-10-11 LAB — POCT GLYCOSYLATED HEMOGLOBIN (HGB A1C)

## 2013-10-11 NOTE — Progress Notes (Signed)
Subjective:    Patient ID: Mark Wilkinson, male    DOB: [birth year], 80 y.o.   MRN: [id number]  HPI Pt here for follow up and management of chronic medical problems. The patient's wife comes to the visit with him today. Patient is up to date on his health maintenance parameters. His lab work will be reviewed with him and his wife today at the visit. The patient's wife indicates that he continues to have problems with his right knee at home although in the office he does not complain of this. It is worse when he's been on his feet for a good while and he does complain of pain to his wife.        Patient Active Problem List   Diagnosis Date Noted  . BPH (benign prostatic hyperplasia) 06/16/2013  . Chest pain 03/18/2013  . Acute encephalopathy 12/11/2012  . Hypotension, unspecified 12/11/2012  . Elevated troponin 12/11/2012  . Expected blood loss anemia 12/09/2012  . Obese 12/09/2012  . S/P right TKA 12/08/2012  . Preop cardiovascular exam 11/25/2012  . Essential hypertension, benign   . Other and unspecified hyperlipidemia   . Diaphragmatic hernia without mention of obstruction or gangrene   . Gout, unspecified   . Calculus of kidney   . [place], [place]   . Asymptomatic varicose veins   . Lumbago    Outpatient Encounter Prescriptions as of 10/11/2013  Medication Sig  . allopurinol (ZYLOPRIM) 300 MG tablet TAKE 1 TABLET DAILY  . aspirin EC 81 MG EC tablet Take 1 tablet (81 mg total) by mouth daily.  Marland Kitchen atenolol (TENORMIN) 50 MG tablet TAKE 1/2 & 1/2 TABLETS BY MOUTH EVERY DAY.  Marland Kitchen atorvastatin (LIPITOR) 40 MG tablet TAKE 1 TABLET BY MOUTH EVERY DAY  . B Complex-C (SUPER B COMPLEX PO) Take 1 tablet by mouth daily.  . Cholecalciferol (VITAMIN D3) 1000 UNITS CAPS Take 2,000 Units by mouth daily.   Marland Kitchen GLUCOSAMINE PO Take 1 tablet by mouth 2 (two) times daily.  . meloxicam (MOBIC) 7.5 MG tablet Take 7.5 mg by mouth daily as needed for pain.  . Multiple Vitamin (MULTIVITAMIN  WITH MINERALS) TABS Take 1 tablet by mouth daily.  Marland Kitchen NEXIUM 40 MG capsule TAKE 1 CAPSULE DAILY  . niacin (NIASPAN) 500 MG CR tablet TAKE 3 TABLETS BY MOUTH AT BEDTIME  . Omega-3 Fatty Acids (FISH OIL) 1000 MG CAPS Take 1,000 mg by mouth 2 (two) times daily.   . quinapril (ACCUPRIL) 10 MG tablet TAKE 1 TABLET EVERY DAY  . Ranibizumab (LUCENTIS) 0.3 MG/0.05ML SOLN Place 1 application into the left eye. Injection q 6-8 weeks  . timolol (BETIMOL) 0.5 % ophthalmic solution Place 1 drop into the left eye 2 (two) times daily.  . [DISCONTINUED] atenolol (TENORMIN) 25 MG tablet Take 25 mg by mouth 2 (two) times daily.   . [DISCONTINUED] atenolol (TENORMIN) 50 MG tablet TAKE 1 & 1/2 TABLETS BY MOUTH EVERY DAY.  . [DISCONTINUED] esomeprazole (NEXIUM) 40 MG packet Take 40 mg by mouth daily before breakfast.  . [DISCONTINUED] niacin (NIASPAN) 500 MG CR tablet Take 1,000 mg by mouth at bedtime.    Review of Systems  Constitutional: Negative.   HENT: Negative.   Eyes: Negative.   Respiratory: Negative.   Cardiovascular: Negative.   Gastrointestinal: Negative.   Endocrine: Negative.   Genitourinary: Negative.   Musculoskeletal: Negative.   Skin: Negative.   Allergic/Immunologic: Negative.   Neurological: Negative.   Hematological: Negative.   Psychiatric/Behavioral: Negative.  Objective:   Physical Exam  Nursing note and vitals reviewed. Constitutional: He is oriented to person, place, and time. He appears well-developed and well-nourished. No distress.  HENT:  Head: Normocephalic and atraumatic.  Right Ear: External ear normal.  Left Ear: External ear normal.  Nose: Nose normal.  Mouth/Throat: Oropharynx is clear and moist. No oropharyngeal exudate.  Eyes: Conjunctivae and EOM are normal. Pupils are equal, round, and reactive to light. Right eye exhibits no discharge. Left eye exhibits no discharge. No scleral icterus.  Neck: Normal range of motion. Neck supple. No tracheal deviation  present. No thyromegaly present.  No carotid bruit  Cardiovascular: Normal rate, regular rhythm, normal heart sounds and intact distal pulses.   No murmur heard. At 84 per minute  Pulmonary/Chest: Effort normal and breath sounds normal. No respiratory distress. He has no wheezes. He has no rales. He exhibits no tenderness.  Abdominal: Soft. Bowel sounds are normal. He exhibits no mass. There is no tenderness. There is no rebound and no guarding.  Musculoskeletal: He exhibits no edema and no tenderness.  Patient has a kyphotic posture and walks with a cane because of his arthritic knees  Lymphadenopathy:    He has no cervical adenopathy.  Neurological: He is alert and oriented to person, place, and time. He has normal reflexes. No cranial nerve deficit.  Short-term memory loss, pleasant and cooperative with no complaints  Skin: Skin is warm and dry. No rash noted.  Psychiatric: He has a normal mood and affect. His behavior is normal. Judgment and thought content normal.   BP 134/80  Pulse 57  Temp(Src) 96.8 F (36 C) (Oral)  Ht 5\' 9"  (1.753 m)  Wt 208 lb (94.348 kg)  BMI 30.70 kg/m2        Assessment & Plan:  1. BPH (benign prostatic hyperplasia)  2. Essential hypertension, benign  3. Other and unspecified hyperlipidemia  4. Metabolic syndrome - POCT glycosylated hemoglobin (Hb A1C)  5. Osteoarthritis of right knee  6. Memory impairment  Patient Instructions                       Medicare Annual Wellness Visit  [facility] and the medical providers at [facility] strive to bring you the best medical care.  In doing so we not only want to address your current medical conditions and concerns but also to detect new conditions early and prevent illness, disease and health-related problems.    Medicare offers a yearly Wellness Visit which allows our clinical staff to assess your need for preventative services including immunizations, lifestyle  education, counseling to decrease risk of preventable diseases and screening for fall risk and other medical concerns.    This visit is provided free of charge (no copay) for all Medicare recipients. The clinical pharmacists at [facility] have begun to conduct these Wellness Visits which will also include a thorough review of all your medications.    As you primary medical provider recommend that you make an appointment for your Annual Wellness Visit if you have not done so already this year.  You may set up this appointment before you leave today or you may call back [phone]) and schedule an appointment.  Please make sure when you call that you mention that you are scheduling your Annual Wellness Visit with the clinical pharmacist so that the appointment may be made for the proper length of time.       Continue current  medications. Continue good therapeutic lifestyle changes which include good diet and exercise. Fall precautions discussed with patient. If an FOBT was given today- please return it to our front desk. If you are over 29 years old - you may need Prevnar 53 or the adult Pneumonia vaccine.     Arrie Senate MD

## 2013-10-11 NOTE — Patient Instructions (Signed)
Medicare Annual Wellness Visit   and the medical providers at [facility] strive to bring you the best medical care.  In doing so we not only want to address your current medical conditions and concerns but also to detect new conditions early and prevent illness, disease and health-related problems.    Medicare offers a yearly Wellness Visit which allows our clinical staff to assess your need for preventative services including immunizations, lifestyle education, counseling to decrease risk of preventable diseases and screening for fall risk and other medical concerns.    This visit is provided free of charge (no copay) for all Medicare recipients. The clinical pharmacists at [facility] have begun to conduct these Wellness Visits which will also include a thorough review of all your medications.    As you primary medical provider recommend that you make an appointment for your Annual Wellness Visit if you have not done so already this year.  You may set up this appointment before you leave today or you may call back ([phone]) and schedule an appointment.  Please make sure when you call that you mention that you are scheduling your Annual Wellness Visit with the clinical pharmacist so that the appointment may be made for the proper length of time.      Continue current medications. Continue good therapeutic lifestyle changes which include good diet and exercise. Fall precautions discussed with patient. If an FOBT was given today- please return it to our front desk. If you are over 50 years old - you may need Prevnar 13 or the adult Pneumonia vaccine.   

## 2013-10-26 DIAGNOSIS — M5430 Sciatica, unspecified side: Secondary | ICD-10-CM | POA: Diagnosis not present

## 2013-10-26 DIAGNOSIS — I10 Essential (primary) hypertension: Secondary | ICD-10-CM | POA: Diagnosis not present

## 2013-10-26 DIAGNOSIS — M25569 Pain in unspecified knee: Secondary | ICD-10-CM | POA: Diagnosis not present

## 2013-10-26 DIAGNOSIS — M999 Biomechanical lesion, unspecified: Secondary | ICD-10-CM | POA: Diagnosis not present

## 2013-11-07 ENCOUNTER — Other Ambulatory Visit: Payer: Self-pay | Admitting: Family Medicine

## 2013-11-09 DIAGNOSIS — M999 Biomechanical lesion, unspecified: Secondary | ICD-10-CM | POA: Diagnosis not present

## 2013-11-09 DIAGNOSIS — M5430 Sciatica, unspecified side: Secondary | ICD-10-CM | POA: Diagnosis not present

## 2013-11-09 DIAGNOSIS — I10 Essential (primary) hypertension: Secondary | ICD-10-CM | POA: Diagnosis not present

## 2013-11-09 DIAGNOSIS — M25569 Pain in unspecified knee: Secondary | ICD-10-CM | POA: Diagnosis not present

## 2013-11-16 DIAGNOSIS — H40019 Open angle with borderline findings, low risk, unspecified eye: Secondary | ICD-10-CM | POA: Diagnosis not present

## 2013-11-16 DIAGNOSIS — H35359 Cystoid macular degeneration, unspecified eye: Secondary | ICD-10-CM | POA: Diagnosis not present

## 2013-11-16 DIAGNOSIS — H348192 Central retinal vein occlusion, unspecified eye, stable: Secondary | ICD-10-CM | POA: Diagnosis not present

## 2013-11-16 DIAGNOSIS — H2510 Age-related nuclear cataract, unspecified eye: Secondary | ICD-10-CM | POA: Diagnosis not present

## 2013-11-16 DIAGNOSIS — Z961 Presence of intraocular lens: Secondary | ICD-10-CM | POA: Diagnosis not present

## 2013-11-18 DIAGNOSIS — H3581 Retinal edema: Secondary | ICD-10-CM | POA: Diagnosis not present

## 2013-11-18 DIAGNOSIS — H348192 Central retinal vein occlusion, unspecified eye, stable: Secondary | ICD-10-CM | POA: Diagnosis not present

## 2013-11-23 DIAGNOSIS — M5430 Sciatica, unspecified side: Secondary | ICD-10-CM | POA: Diagnosis not present

## 2013-11-23 DIAGNOSIS — I10 Essential (primary) hypertension: Secondary | ICD-10-CM | POA: Diagnosis not present

## 2013-11-23 DIAGNOSIS — M999 Biomechanical lesion, unspecified: Secondary | ICD-10-CM | POA: Diagnosis not present

## 2013-11-23 DIAGNOSIS — M25569 Pain in unspecified knee: Secondary | ICD-10-CM | POA: Diagnosis not present

## 2013-11-24 DIAGNOSIS — Z96659 Presence of unspecified artificial knee joint: Secondary | ICD-10-CM | POA: Diagnosis not present

## 2013-11-24 DIAGNOSIS — M1710 Unilateral primary osteoarthritis, unspecified knee: Secondary | ICD-10-CM | POA: Diagnosis not present

## 2013-12-07 DIAGNOSIS — M5430 Sciatica, unspecified side: Secondary | ICD-10-CM | POA: Diagnosis not present

## 2013-12-07 DIAGNOSIS — M999 Biomechanical lesion, unspecified: Secondary | ICD-10-CM | POA: Diagnosis not present

## 2013-12-07 DIAGNOSIS — I10 Essential (primary) hypertension: Secondary | ICD-10-CM | POA: Diagnosis not present

## 2013-12-07 DIAGNOSIS — M25569 Pain in unspecified knee: Secondary | ICD-10-CM | POA: Diagnosis not present

## 2013-12-13 ENCOUNTER — Other Ambulatory Visit: Payer: Self-pay | Admitting: Family Medicine

## 2013-12-21 DIAGNOSIS — I10 Essential (primary) hypertension: Secondary | ICD-10-CM | POA: Diagnosis not present

## 2013-12-21 DIAGNOSIS — M5430 Sciatica, unspecified side: Secondary | ICD-10-CM | POA: Diagnosis not present

## 2013-12-21 DIAGNOSIS — M999 Biomechanical lesion, unspecified: Secondary | ICD-10-CM | POA: Diagnosis not present

## 2013-12-21 DIAGNOSIS — M25569 Pain in unspecified knee: Secondary | ICD-10-CM | POA: Diagnosis not present

## 2013-12-28 DIAGNOSIS — H2510 Age-related nuclear cataract, unspecified eye: Secondary | ICD-10-CM | POA: Diagnosis not present

## 2013-12-28 DIAGNOSIS — H40019 Open angle with borderline findings, low risk, unspecified eye: Secondary | ICD-10-CM | POA: Diagnosis not present

## 2013-12-28 DIAGNOSIS — Z961 Presence of intraocular lens: Secondary | ICD-10-CM | POA: Diagnosis not present

## 2013-12-29 ENCOUNTER — Other Ambulatory Visit: Payer: Self-pay | Admitting: Family Medicine

## 2014-01-04 DIAGNOSIS — I10 Essential (primary) hypertension: Secondary | ICD-10-CM | POA: Diagnosis not present

## 2014-01-04 DIAGNOSIS — M5430 Sciatica, unspecified side: Secondary | ICD-10-CM | POA: Diagnosis not present

## 2014-01-04 DIAGNOSIS — M25569 Pain in unspecified knee: Secondary | ICD-10-CM | POA: Diagnosis not present

## 2014-01-04 DIAGNOSIS — M999 Biomechanical lesion, unspecified: Secondary | ICD-10-CM | POA: Diagnosis not present

## 2014-01-06 ENCOUNTER — Other Ambulatory Visit: Payer: Self-pay | Admitting: Family Medicine

## 2014-01-10 DIAGNOSIS — L570 Actinic keratosis: Secondary | ICD-10-CM | POA: Diagnosis not present

## 2014-01-11 DIAGNOSIS — H348192 Central retinal vein occlusion, unspecified eye, stable: Secondary | ICD-10-CM | POA: Diagnosis not present

## 2014-01-11 DIAGNOSIS — H3581 Retinal edema: Secondary | ICD-10-CM | POA: Diagnosis not present

## 2014-01-18 DIAGNOSIS — M999 Biomechanical lesion, unspecified: Secondary | ICD-10-CM | POA: Diagnosis not present

## 2014-01-18 DIAGNOSIS — M25569 Pain in unspecified knee: Secondary | ICD-10-CM | POA: Diagnosis not present

## 2014-01-18 DIAGNOSIS — I10 Essential (primary) hypertension: Secondary | ICD-10-CM | POA: Diagnosis not present

## 2014-01-18 DIAGNOSIS — M5430 Sciatica, unspecified side: Secondary | ICD-10-CM | POA: Diagnosis not present

## 2014-02-01 DIAGNOSIS — M25569 Pain in unspecified knee: Secondary | ICD-10-CM | POA: Diagnosis not present

## 2014-02-01 DIAGNOSIS — M9981 Other biomechanical lesions of cervical region: Secondary | ICD-10-CM | POA: Diagnosis not present

## 2014-02-01 DIAGNOSIS — M999 Biomechanical lesion, unspecified: Secondary | ICD-10-CM | POA: Diagnosis not present

## 2014-02-01 DIAGNOSIS — M5137 Other intervertebral disc degeneration, lumbosacral region: Secondary | ICD-10-CM | POA: Diagnosis not present

## 2014-02-01 DIAGNOSIS — I10 Essential (primary) hypertension: Secondary | ICD-10-CM | POA: Diagnosis not present

## 2014-02-02 DIAGNOSIS — H0019 Chalazion unspecified eye, unspecified eyelid: Secondary | ICD-10-CM | POA: Diagnosis not present

## 2014-02-02 DIAGNOSIS — H01029 Squamous blepharitis unspecified eye, unspecified eyelid: Secondary | ICD-10-CM | POA: Diagnosis not present

## 2014-02-04 ENCOUNTER — Encounter: Payer: Self-pay | Admitting: *Deleted

## 2014-02-05 ENCOUNTER — Other Ambulatory Visit: Payer: Self-pay | Admitting: Family Medicine

## 2014-02-08 ENCOUNTER — Other Ambulatory Visit (INDEPENDENT_AMBULATORY_CARE_PROVIDER_SITE_OTHER): Payer: Medicare Other

## 2014-02-08 DIAGNOSIS — R5381 Other malaise: Secondary | ICD-10-CM | POA: Diagnosis not present

## 2014-02-08 DIAGNOSIS — I10 Essential (primary) hypertension: Secondary | ICD-10-CM

## 2014-02-08 DIAGNOSIS — R5383 Other fatigue: Secondary | ICD-10-CM

## 2014-02-08 DIAGNOSIS — E785 Hyperlipidemia, unspecified: Secondary | ICD-10-CM

## 2014-02-08 DIAGNOSIS — E8881 Metabolic syndrome: Secondary | ICD-10-CM | POA: Diagnosis not present

## 2014-02-08 DIAGNOSIS — R5382 Chronic fatigue, unspecified: Secondary | ICD-10-CM

## 2014-02-08 DIAGNOSIS — E559 Vitamin D deficiency, unspecified: Secondary | ICD-10-CM

## 2014-02-08 LAB — POCT CBC
Granulocyte percent: 65.1 %G (ref 37–80)
HEMATOCRIT: 45.6 % (ref 43.5–53.7)
HEMOGLOBIN: 14.4 g/dL (ref 14.1–18.1)
Lymph, poc: 2.5 (ref 0.6–3.4)
MCH, POC: 28.6 pg (ref 27–31.2)
MCHC: 31.7 g/dL — AB (ref 31.8–35.4)
MCV: 90.4 fL (ref 80–97)
MPV: 9 fL (ref 0–99.8)
POC GRANULOCYTE: 5.3 (ref 2–6.9)
POC LYMPH PERCENT: 31 %L (ref 10–50)
Platelet Count, POC: 123 10*3/uL — AB (ref 142–424)
RBC: 5 M/uL (ref 4.69–6.13)
RDW, POC: 14.2 %
WBC: 8.1 10*3/uL (ref 4.6–10.2)

## 2014-02-08 LAB — POCT GLYCOSYLATED HEMOGLOBIN (HGB A1C): Hemoglobin A1C: 5.6

## 2014-02-10 ENCOUNTER — Telehealth: Payer: Self-pay | Admitting: Family Medicine

## 2014-02-10 LAB — NMR, LIPOPROFILE
CHOLESTEROL: 112 mg/dL (ref 100–199)
HDL Cholesterol by NMR: 47 mg/dL (ref >39)
HDL Particle Number: 24.6 umol/L — ABNORMAL LOW (ref >=30.5)
LDL PARTICLE NUMBER: 458 nmol/L (ref <1000)
LDL SIZE: 21.5 nm (ref >20.5)
LDLC SERPL CALC-MCNC: 53 mg/dL (ref 0–99)
Small LDL Particle Number: 124 nmol/L (ref <=527)
Triglycerides by NMR: 60 mg/dL (ref 0–149)

## 2014-02-10 LAB — BMP8+EGFR
BUN/Creatinine Ratio: 9 — ABNORMAL LOW (ref 10–22)
BUN: 8 mg/dL (ref 8–27)
CALCIUM: 9.4 mg/dL (ref 8.6–10.2)
CO2: 27 mmol/L (ref 18–29)
CREATININE: 0.9 mg/dL (ref 0.76–1.27)
Chloride: 104 mmol/L (ref 97–108)
GFR calc Af Amer: 90 mL/min/{1.73_m2} (ref >59)
GFR, EST NON AFRICAN AMERICAN: 78 mL/min/{1.73_m2} (ref >59)
Glucose: 127 mg/dL — ABNORMAL HIGH (ref 65–99)
Potassium: 5 mmol/L (ref 3.5–5.2)
SODIUM: 145 mmol/L — AB (ref 134–144)

## 2014-02-10 LAB — HEPATIC FUNCTION PANEL
ALBUMIN: 3.8 g/dL (ref 3.5–4.7)
ALT: 13 IU/L (ref 0–44)
AST: 23 IU/L (ref 0–40)
Alkaline Phosphatase: 90 IU/L (ref 39–117)
BILIRUBIN DIRECT: 0.2 mg/dL (ref 0.00–0.40)
BILIRUBIN TOTAL: 0.6 mg/dL (ref 0.0–1.2)
Total Protein: 6.6 g/dL (ref 6.0–8.5)

## 2014-02-10 LAB — VITAMIN D 25 HYDROXY (VIT D DEFICIENCY, FRACTURES): Vit D, 25-Hydroxy: 74.9 ng/mL (ref 30.0–100.0)

## 2014-02-10 NOTE — Telephone Encounter (Signed)
Patient aware.

## 2014-02-10 NOTE — Telephone Encounter (Signed)
Message copied by Waverly Ferrari on Thu Feb 10, 2014 10:25 AM ------      Message from: Chipper Herb      Created: Thu Feb 10, 2014  7:21 AM       Please call the patient's wife with these results      Blood sugar is elevated at 127. The creatinine, the most important kidney function test is within normal limits. The electrolytes including potassium are within normal limits      Cholesterol numbers with advanced lipid testing or excellent and at goal except the good cholesterol remains low as it has been in the past ------ the patient is taking 30 mg of atorvastatin he can reduce this by 1/2 to 20 mg because his numbers are so good today      The vitamin D level is excellent at 74.9. Continue current treatment ------

## 2014-02-14 ENCOUNTER — Telehealth: Payer: Self-pay | Admitting: *Deleted

## 2014-02-14 NOTE — Telephone Encounter (Signed)
Message copied by Rolena Infante on Mon Feb 14, 2014 11:36 AM ------      Message from: Chipper Herb      Created: Thu Feb 10, 2014  7:21 AM       Please call the patient's wife with these results      Blood sugar is elevated at 127. The creatinine, the most important kidney function test is within normal limits. The electrolytes including potassium are within normal limits      Cholesterol numbers with advanced lipid testing or excellent and at goal except the good cholesterol remains low as it has been in the past ------ the patient is taking 30 mg of atorvastatin he can reduce this by 1/2 to 20 mg because his numbers are so good today      The vitamin D level is excellent at 74.9. Continue current treatment ------

## 2014-02-15 ENCOUNTER — Ambulatory Visit: Payer: 59 | Admitting: Family Medicine

## 2014-02-16 DIAGNOSIS — M5137 Other intervertebral disc degeneration, lumbosacral region: Secondary | ICD-10-CM | POA: Diagnosis not present

## 2014-02-16 DIAGNOSIS — I10 Essential (primary) hypertension: Secondary | ICD-10-CM | POA: Diagnosis not present

## 2014-02-16 DIAGNOSIS — M9981 Other biomechanical lesions of cervical region: Secondary | ICD-10-CM | POA: Diagnosis not present

## 2014-02-16 DIAGNOSIS — M999 Biomechanical lesion, unspecified: Secondary | ICD-10-CM | POA: Diagnosis not present

## 2014-02-16 DIAGNOSIS — M25569 Pain in unspecified knee: Secondary | ICD-10-CM | POA: Diagnosis not present

## 2014-02-17 ENCOUNTER — Encounter: Payer: Self-pay | Admitting: Family Medicine

## 2014-02-17 ENCOUNTER — Ambulatory Visit (INDEPENDENT_AMBULATORY_CARE_PROVIDER_SITE_OTHER): Payer: Medicare Other | Admitting: Family Medicine

## 2014-02-17 VITALS — BP 109/71 | HR 72 | Temp 96.6°F | Ht 69.0 in | Wt 210.0 lb

## 2014-02-17 DIAGNOSIS — I4891 Unspecified atrial fibrillation: Secondary | ICD-10-CM

## 2014-02-17 DIAGNOSIS — E8881 Metabolic syndrome: Secondary | ICD-10-CM

## 2014-02-17 DIAGNOSIS — N400 Enlarged prostate without lower urinary tract symptoms: Secondary | ICD-10-CM

## 2014-02-17 DIAGNOSIS — I10 Essential (primary) hypertension: Secondary | ICD-10-CM

## 2014-02-17 DIAGNOSIS — E785 Hyperlipidemia, unspecified: Secondary | ICD-10-CM

## 2014-02-17 DIAGNOSIS — I499 Cardiac arrhythmia, unspecified: Secondary | ICD-10-CM | POA: Diagnosis not present

## 2014-02-17 MED ORDER — RIVAROXABAN 15 MG PO TABS: 15.0000 mg | ORAL_TABLET | Freq: Every day | ORAL | Status: DC

## 2014-02-17 MED ORDER — RIVAROXABAN 20 MG PO TABS: 15.0000 mg | ORAL_TABLET | Freq: Every day | ORAL | Status: DC

## 2014-02-17 NOTE — Addendum Note (Signed)
Addended by: Zannie Cove on: 02/17/2014 03:42 PM   Modules accepted: Orders

## 2014-02-17 NOTE — Patient Instructions (Addendum)
Medicare Annual Wellness Visit  [facility] and the medical providers at [facility] strive to bring you the best medical care.  In doing so we not only want to address your current medical conditions and concerns but also to detect new conditions early and prevent illness, disease and health-related problems.    Medicare offers a yearly Wellness Visit which allows our clinical staff to assess your need for preventative services including immunizations, lifestyle education, counseling to decrease risk of preventable diseases and screening for fall risk and other medical concerns.    This visit is provided free of charge (no copay) for all Medicare recipients. The clinical pharmacists at [facility] have begun to conduct these Wellness Visits which will also include a thorough review of all your medications.    As you primary medical provider recommend that you make an appointment for your Annual Wellness Visit if you have not done so already this year.  You may set up this appointment before you leave today or you may call back ([phone]) and schedule an appointment.  Please make sure when you call that you mention that you are scheduling your Annual Wellness Visit with the clinical pharmacist so that the appointment may be made for the proper length of time.     Continue current medications. Continue good therapeutic lifestyle changes which include good diet and exercise. Fall precautions discussed with patient. If an FOBT was given today- please return it to our front desk. If you are over 68 years old - you may need Prevnar 73 or the adult Pneumonia vaccine.  Flu Shots will be available at our office starting mid- September. Please call and schedule a FLU CLINIC APPOINTMENT.   Continue to be careful I did not put yourself at risk for falling Keep appointment with orthopedist here Reduce atorvastatin as directed Take blood  thinner as directed Discontinue aspirin and any anti-inflammatory medicine like ibuprofen or meloxicam Only take Tylenol We will work on an appointment with the cardiologist

## 2014-02-17 NOTE — Progress Notes (Signed)
Subjective:    Patient ID: Mark Wilkinson, male    DOB: Mar 10, 1928, 80 y.o.   MRN: [id number]  HPI Pt here for follow up and management of chronic medical problems. The patient comes to the visit today with his wife. His usual he has no complaints. The patient's recent lab work will be reviewed with the patient and his wife. The only abnormality on the lab work was an elevated blood sugar but the hemoglobin A1c was good. Kidney function and cholesterol numbers were good. It was recommended that the patient reduce his atorvastatin to one half of the dose because his cholesterol numbers were so good. Because of the complaints at home with his knee we will arrange an appointment for him to see the orthopedic surgeon. Please see outside blood pressures brought in for review.      Patient Active Problem List   Diagnosis Date Noted  . Metabolic syndrome 35/32/9924  . BPH (benign prostatic hyperplasia) 06/16/2013  . Chest pain 03/18/2013  . Acute encephalopathy 12/11/2012  . Hypotension, unspecified 12/11/2012  . Elevated troponin 12/11/2012  . Expected blood loss anemia 12/09/2012  . Obese 12/09/2012  . S/P right TKA 12/08/2012  . Preop cardiovascular exam 11/25/2012  . Essential hypertension, benign   . Other and unspecified hyperlipidemia   . Diaphragmatic hernia without mention of obstruction or gangrene   . Gout, unspecified   . Calculus of kidney   . [place], [place]   . Asymptomatic varicose veins   . Lumbago    Outpatient Encounter Prescriptions as of 02/17/2014  Medication Sig  . allopurinol (ZYLOPRIM) 300 MG tablet TAKE 1 TABLET DAILY  . aspirin EC 81 MG EC tablet Take 1 tablet (81 mg total) by mouth daily.  Marland Kitchen atenolol (TENORMIN) 50 MG tablet TAKE 1 & 1/2 TABLETS BY MOUTH EVERY DAY.  Marland Kitchen atorvastatin (LIPITOR) 40 MG tablet TAKE 1 TABLET BY MOUTH EVERY DAY  . B Complex-C (SUPER B COMPLEX PO) Take 1 tablet by mouth daily.  . Cholecalciferol (VITAMIN D3) 1000 UNITS CAPS  Take 2,000 Units by mouth daily.   Marland Kitchen GLUCOSAMINE PO Take 1 tablet by mouth 2 (two) times daily.  . meloxicam (MOBIC) 7.5 MG tablet Take 7.5 mg by mouth daily as needed for pain.  . Multiple Vitamin (MULTIVITAMIN WITH MINERALS) TABS Take 1 tablet by mouth daily.  Marland Kitchen NEXIUM 40 MG capsule TAKE 1 CAPSULE DAILY  . niacin (NIASPAN) 500 MG CR tablet TAKE 3 TABLETS BY MOUTH AT BEDTIME  . Omega-3 Fatty Acids (FISH OIL) 1000 MG CAPS Take 1,000 mg by mouth 2 (two) times daily.   . quinapril (ACCUPRIL) 10 MG tablet TAKE 1 TABLET EVERY DAY  . Ranibizumab (LUCENTIS) 0.3 MG/0.05ML SOLN Place 1 application into the left eye. Injection q 6-8 weeks  . timolol (BETIMOL) 0.5 % ophthalmic solution Place 1 drop into the left eye 2 (two) times daily.  . [DISCONTINUED] atenolol (TENORMIN) 50 MG tablet TAKE 1/2 & 1/2 TABLETS BY MOUTH EVERY DAY.    Review of Systems  Constitutional: Negative.   HENT: Negative.   Eyes: Negative.   Respiratory: Negative.   Cardiovascular: Negative.   Gastrointestinal: Negative.   Endocrine: Negative.   Genitourinary: Negative.   Musculoskeletal: Negative.   Skin: Negative.   Allergic/Immunologic: Negative.   Neurological: Negative.   Hematological: Negative.   Psychiatric/Behavioral: Negative.        Objective:   Physical Exam  Nursing note and vitals reviewed. Constitutional: He is oriented to  person, place, and time. He appears well-developed and well-nourished. No distress.  Elderly but pleasant and somewhat decreased memory  HENT:  Head: Normocephalic and atraumatic.  Right Ear: External ear normal.  Left Ear: External ear normal.  Nose: Nose normal.  Mouth/Throat: Oropharynx is clear and moist. No oropharyngeal exudate.  Eyes: Conjunctivae and EOM are normal. Pupils are equal, round, and reactive to light. Right eye exhibits no discharge. Left eye exhibits no discharge. No scleral icterus.  Neck: Normal range of motion. Neck supple. No thyromegaly present.    Cardiovascular: Normal rate, normal heart sounds and intact distal pulses.  Exam reveals no gallop and no friction rub.   No murmur heard. The rhythm was irregular irregular at 60 per minute  Pulmonary/Chest: Effort normal and breath sounds normal. No respiratory distress. He has no wheezes. He has no rales. He exhibits no tenderness.  Abdominal: Soft. Bowel sounds are normal. He exhibits no mass. There is no tenderness. There is no rebound and no guarding.  Musculoskeletal: Normal range of motion. He exhibits no edema and no tenderness.   There is minimal joint pain and tenderness in the left knee  Lymphadenopathy:    He has no cervical adenopathy.  Neurological: He is oriented to person, place, and time. He has normal reflexes. No cranial nerve deficit.  Alert but memory is decreased  Skin: Skin is warm and dry. No rash noted. No erythema. No pallor.  Psychiatric: He has a normal mood and affect. His behavior is normal. Judgment and thought content normal.  Decreased memory   BP 109/71  Pulse 72  Temp(Src) 96.6 F (35.9 C) (Oral)  Ht 5\' 9"  (1.753 m)  Wt 210 lb (95.255 kg)  BMI 31.00 kg/m2   EKG: Atrial fibrillation at 84 per minute      Assessment & Plan:  1. BPH (benign prostatic hyperplasia)  2. Essential hypertension, benign - EKG 12-Lead  3. Metabolic syndrome  4. Other and unspecified hyperlipidemia  5. Irregular heart beat - EKG 12-Lead  6. Atrial fibrillation, unspecified   Patient Instructions                       Medicare Annual Wellness Visit  [facility] and the medical providers at [facility] strive to bring you the best medical care.  In doing so we not only want to address your current medical conditions and concerns but also to detect new conditions early and prevent illness, disease and health-related problems.    Medicare offers a yearly Wellness Visit which allows our clinical staff to assess your need for preventative  services including immunizations, lifestyle education, counseling to decrease risk of preventable diseases and screening for fall risk and other medical concerns.    This visit is provided free of charge (no copay) for all Medicare recipients. The clinical pharmacists at [facility] have begun to conduct these Wellness Visits which will also include a thorough review of all your medications.    As you primary medical provider recommend that you make an appointment for your Annual Wellness Visit if you have not done so already this year.  You may set up this appointment before you leave today or you may call back ([phone]) and schedule an appointment.  Please make sure when you call that you mention that you are scheduling your Annual Wellness Visit with the clinical pharmacist so that the appointment may be made for the proper length of time.  Continue current medications. Continue good therapeutic lifestyle changes which include good diet and exercise. Fall precautions discussed with patient. If an FOBT was given today- please return it to our front desk. If you are over 40 years old - you may need Prevnar 27 or the adult Pneumonia vaccine.  Flu Shots will be available at our office starting mid- September. Please call and schedule a FLU CLINIC APPOINTMENT.   Continue to be careful I did not put yourself at risk for falling Keep appointment with orthopedist here Reduce atorvastatin as directed Take blood thinner as directed Discontinue aspirin and any anti-inflammatory medicine like ibuprofen or meloxicam Only take Tylenol We will work on an appointment with the cardiologist   Arrie Senate MD  Arrie Senate MD

## 2014-02-24 ENCOUNTER — Telehealth: Payer: Self-pay | Admitting: Family Medicine

## 2014-02-24 NOTE — Telephone Encounter (Signed)
Referral will contact cardio and notify pt

## 2014-02-25 ENCOUNTER — Ambulatory Visit (INDEPENDENT_AMBULATORY_CARE_PROVIDER_SITE_OTHER): Payer: Medicare Other | Admitting: Cardiovascular Disease

## 2014-02-25 ENCOUNTER — Encounter: Payer: Self-pay | Admitting: Cardiovascular Disease

## 2014-02-25 VITALS — BP 122/86 | HR 84 | Ht 66.0 in | Wt 209.5 lb

## 2014-02-25 DIAGNOSIS — I10 Essential (primary) hypertension: Secondary | ICD-10-CM | POA: Diagnosis not present

## 2014-02-25 DIAGNOSIS — I482 Chronic atrial fibrillation, unspecified: Secondary | ICD-10-CM

## 2014-02-25 DIAGNOSIS — I4819 Other persistent atrial fibrillation: Secondary | ICD-10-CM | POA: Insufficient documentation

## 2014-02-25 NOTE — Assessment & Plan Note (Signed)
On statin therapy followed by his PCP 

## 2014-02-25 NOTE — Patient Instructions (Signed)
Your physician wants you to follow-up in: 1 year in [place] with Dr Percival Spanish. You will receive a reminder letter in the mail two months in advance. If you don't receive a letter, please call our office to schedule the follow-up appointment.

## 2014-02-25 NOTE — Assessment & Plan Note (Signed)
Controlled on current medications 

## 2014-02-25 NOTE — Progress Notes (Signed)
02/25/2014 VERA WISHART   1927/12/10  [id number]  Primary Physician Redge Gainer, MD Primary Cardiologist: Lorretta Harp MD Renae Gloss   HPI:  Mr. Demicco is a delightful 80 year old mildly overweight married Caucasian male father of 4 children, grandfather to one grandchild referred by Dr. Morrie Sheldon , his primary care physician, for evaluation and treatment of new lead recognized atrial fibrillation. 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 He has seen Dr. Percival Spanish  in [place] a year ago for preop clearance before a total knee replacement. He has a history of treated hypertension and hyperlipidemia. He has never had a heart attack or stroke. He did have a brother who died at age 23 of a myocardial infarction. He has newly recognized A. Fib but is asymptomatic from this and was placed on oral anticoagulants by his primary care physician.  Current Outpatient Prescriptions  Medication Sig Dispense Refill  . allopurinol (ZYLOPRIM) 300 MG tablet TAKE 1 TABLET DAILY  90 tablet  1  . atenolol (TENORMIN) 50 MG tablet TAKE 1 & 1/2 TABLETS BY MOUTH EVERY DAY.  135 tablet  0  . atorvastatin (LIPITOR) 40 MG tablet TAKE 1 TABLET BY MOUTH EVERY DAY  90 tablet  0  . B Complex-C (SUPER B COMPLEX PO) Take 1 tablet by mouth daily.      . Cholecalciferol (VITAMIN D3) 1000 UNITS CAPS Take 2,000 Units by mouth daily.       Marland Kitchen GLUCOSAMINE PO Take 1 tablet by mouth 2 (two) times daily.      . Multiple Vitamin (MULTIVITAMIN WITH MINERALS) TABS Take 1 tablet by mouth daily.      Marland Kitchen NEXIUM 40 MG capsule TAKE 1 CAPSULE DAILY  90 capsule  0  . niacin (NIASPAN) 500 MG CR tablet TAKE 3 TABLETS BY MOUTH AT BEDTIME  270 tablet  1  . Omega-3 Fatty Acids (FISH OIL) 1000 MG CAPS Take 1,000 mg by mouth 2 (two) times daily.       . quinapril (ACCUPRIL) 10 MG tablet TAKE 1 TABLET EVERY DAY  90 tablet  1  . Ranibizumab (LUCENTIS) 0.3 MG/0.05ML SOLN Place 1 application into the left eye. Injection q 6-8 weeks       . Rivaroxaban (XARELTO) 20 MG TABS tablet Take 1 tablet (20 mg total) by mouth daily with supper.  30 tablet  1   No current facility-administered medications for this visit.    Allergies  Allergen Reactions  . Penicillins Other (See Comments)    Unknown     History   Social History  . Marital Status: Married    Spouse Name: N/A    Number of Children: 4  . Years of Education: N/A   Occupational History  . Retired Development worker, community carrier    Social History Main Topics  . Smoking status: Former Smoker -- 0.12 packs/day for 10 years    Types: Cigarettes    Quit date: 12/11/1960  . Smokeless tobacco: Never Used  . Alcohol Use: No  . Drug Use: No  . Sexual Activity: No   Other Topics Concern  . Not on file   Social History Narrative   Lives with wife of 65 years.       Review of Systems: General: negative for chills, fever, night sweats or weight changes.  Cardiovascular: negative for chest pain, dyspnea on exertion, edema, orthopnea, palpitations, paroxysmal nocturnal dyspnea or shortness of breath Dermatological: negative for rash Respiratory: negative for cough  or wheezing Urologic: negative for hematuria Abdominal: negative for nausea, vomiting, diarrhea, bright red blood per rectum, melena, or hematemesis Neurologic: negative for visual changes, syncope, or dizziness All other systems reviewed and are otherwise negative except as noted above.    Blood pressure 122/86, pulse 84, height 5\' 6"  (1.676 m), weight 209 lb 8 oz (95.029 kg).  General appearance: alert and no distress Neck: no adenopathy, no carotid bruit, no JVD, supple, symmetrical, trachea midline and thyroid not enlarged, symmetric, no tenderness/mass/nodules Lungs: clear to auscultation bilaterally Heart: irregularly irregular rhythm Extremities: extremities normal, atraumatic, no cyanosis or edema  EKG atrial fibrillation with a ventricular response of 84 and nonspecific ST and T-wave  changes  ASSESSMENT AND PLAN:   Essential hypertension, benign Controlled on current medications  Other and unspecified hyperlipidemia On statin therapy followed by his PCP  Atrial fibrillation Newly recognized atrial fibrillation with a controlled ventricular response. The patient is asymptomatic. He was placed on Xarelto  by his primary care physician. His CHA2DS2VASC score is 3. He had a 2-D echocardiogram performed last year that was unremarkable. I am going to leave him in A. Fib, rate controlled on oral anticoagulation.       Lorretta Harp MD FACP,FACC,FAHA, [facility] 02/25/2014 11:46 AM

## 2014-02-25 NOTE — Assessment & Plan Note (Signed)
Newly recognized atrial fibrillation with a controlled ventricular response. The patient is asymptomatic. He was placed on Xarelto  by his primary care physician. His CHA2DS2VASC score is 3. He had a 2-D echocardiogram performed last year that was unremarkable. I am going to leave him in A. Fib, rate controlled on oral anticoagulation.

## 2014-03-01 DIAGNOSIS — M9902 Segmental and somatic dysfunction of thoracic region: Secondary | ICD-10-CM | POA: Diagnosis not present

## 2014-03-01 DIAGNOSIS — M5137 Other intervertebral disc degeneration, lumbosacral region: Secondary | ICD-10-CM | POA: Diagnosis not present

## 2014-03-01 DIAGNOSIS — M9901 Segmental and somatic dysfunction of cervical region: Secondary | ICD-10-CM | POA: Diagnosis not present

## 2014-03-01 DIAGNOSIS — M9903 Segmental and somatic dysfunction of lumbar region: Secondary | ICD-10-CM | POA: Diagnosis not present

## 2014-03-08 DIAGNOSIS — M1712 Unilateral primary osteoarthritis, left knee: Secondary | ICD-10-CM | POA: Diagnosis not present

## 2014-03-09 ENCOUNTER — Ambulatory Visit (INDEPENDENT_AMBULATORY_CARE_PROVIDER_SITE_OTHER): Payer: Medicare Other

## 2014-03-09 DIAGNOSIS — Z23 Encounter for immunization: Secondary | ICD-10-CM | POA: Diagnosis not present

## 2014-03-15 DIAGNOSIS — M9903 Segmental and somatic dysfunction of lumbar region: Secondary | ICD-10-CM | POA: Diagnosis not present

## 2014-03-15 DIAGNOSIS — M9902 Segmental and somatic dysfunction of thoracic region: Secondary | ICD-10-CM | POA: Diagnosis not present

## 2014-03-15 DIAGNOSIS — M5137 Other intervertebral disc degeneration, lumbosacral region: Secondary | ICD-10-CM | POA: Diagnosis not present

## 2014-03-15 DIAGNOSIS — M9901 Segmental and somatic dysfunction of cervical region: Secondary | ICD-10-CM | POA: Diagnosis not present

## 2014-03-22 DIAGNOSIS — H34812 Central retinal vein occlusion, left eye: Secondary | ICD-10-CM | POA: Diagnosis not present

## 2014-03-22 DIAGNOSIS — H34832 Tributary (branch) retinal vein occlusion, left eye: Secondary | ICD-10-CM | POA: Diagnosis not present

## 2014-03-23 DIAGNOSIS — M9903 Segmental and somatic dysfunction of lumbar region: Secondary | ICD-10-CM | POA: Diagnosis not present

## 2014-03-23 DIAGNOSIS — M9902 Segmental and somatic dysfunction of thoracic region: Secondary | ICD-10-CM | POA: Diagnosis not present

## 2014-03-23 DIAGNOSIS — M9901 Segmental and somatic dysfunction of cervical region: Secondary | ICD-10-CM | POA: Diagnosis not present

## 2014-03-23 DIAGNOSIS — M5137 Other intervertebral disc degeneration, lumbosacral region: Secondary | ICD-10-CM | POA: Diagnosis not present

## 2014-04-04 ENCOUNTER — Other Ambulatory Visit: Payer: Self-pay | Admitting: Family Medicine

## 2014-04-05 DIAGNOSIS — M9902 Segmental and somatic dysfunction of thoracic region: Secondary | ICD-10-CM | POA: Diagnosis not present

## 2014-04-05 DIAGNOSIS — M9901 Segmental and somatic dysfunction of cervical region: Secondary | ICD-10-CM | POA: Diagnosis not present

## 2014-04-05 DIAGNOSIS — M9903 Segmental and somatic dysfunction of lumbar region: Secondary | ICD-10-CM | POA: Diagnosis not present

## 2014-04-05 DIAGNOSIS — M5137 Other intervertebral disc degeneration, lumbosacral region: Secondary | ICD-10-CM | POA: Diagnosis not present

## 2014-04-11 DIAGNOSIS — D485 Neoplasm of uncertain behavior of skin: Secondary | ICD-10-CM | POA: Diagnosis not present

## 2014-04-11 DIAGNOSIS — L570 Actinic keratosis: Secondary | ICD-10-CM | POA: Diagnosis not present

## 2014-04-18 ENCOUNTER — Other Ambulatory Visit: Payer: Self-pay | Admitting: Family Medicine

## 2014-04-19 DIAGNOSIS — M9901 Segmental and somatic dysfunction of cervical region: Secondary | ICD-10-CM | POA: Diagnosis not present

## 2014-04-19 DIAGNOSIS — M5137 Other intervertebral disc degeneration, lumbosacral region: Secondary | ICD-10-CM | POA: Diagnosis not present

## 2014-04-19 DIAGNOSIS — M9902 Segmental and somatic dysfunction of thoracic region: Secondary | ICD-10-CM | POA: Diagnosis not present

## 2014-04-19 DIAGNOSIS — M9903 Segmental and somatic dysfunction of lumbar region: Secondary | ICD-10-CM | POA: Diagnosis not present

## 2014-04-19 DIAGNOSIS — M25562 Pain in left knee: Secondary | ICD-10-CM | POA: Diagnosis not present

## 2014-04-22 ENCOUNTER — Ambulatory Visit: Payer: 59

## 2014-04-24 ENCOUNTER — Other Ambulatory Visit: Payer: Self-pay | Admitting: Family Medicine

## 2014-05-03 DIAGNOSIS — H35352 Cystoid macular degeneration, left eye: Secondary | ICD-10-CM | POA: Diagnosis not present

## 2014-05-03 DIAGNOSIS — H34812 Central retinal vein occlusion, left eye: Secondary | ICD-10-CM | POA: Diagnosis not present

## 2014-05-04 DIAGNOSIS — M5137 Other intervertebral disc degeneration, lumbosacral region: Secondary | ICD-10-CM | POA: Diagnosis not present

## 2014-05-04 DIAGNOSIS — M9902 Segmental and somatic dysfunction of thoracic region: Secondary | ICD-10-CM | POA: Diagnosis not present

## 2014-05-04 DIAGNOSIS — M25562 Pain in left knee: Secondary | ICD-10-CM | POA: Diagnosis not present

## 2014-05-04 DIAGNOSIS — M9901 Segmental and somatic dysfunction of cervical region: Secondary | ICD-10-CM | POA: Diagnosis not present

## 2014-05-04 DIAGNOSIS — M9903 Segmental and somatic dysfunction of lumbar region: Secondary | ICD-10-CM | POA: Diagnosis not present

## 2014-05-14 ENCOUNTER — Other Ambulatory Visit: Payer: Self-pay | Admitting: Family Medicine

## 2014-05-18 DIAGNOSIS — M25562 Pain in left knee: Secondary | ICD-10-CM | POA: Diagnosis not present

## 2014-05-18 DIAGNOSIS — M9902 Segmental and somatic dysfunction of thoracic region: Secondary | ICD-10-CM | POA: Diagnosis not present

## 2014-05-18 DIAGNOSIS — M9901 Segmental and somatic dysfunction of cervical region: Secondary | ICD-10-CM | POA: Diagnosis not present

## 2014-05-18 DIAGNOSIS — M9903 Segmental and somatic dysfunction of lumbar region: Secondary | ICD-10-CM | POA: Diagnosis not present

## 2014-05-18 DIAGNOSIS — M5137 Other intervertebral disc degeneration, lumbosacral region: Secondary | ICD-10-CM | POA: Diagnosis not present

## 2014-06-01 DIAGNOSIS — M9903 Segmental and somatic dysfunction of lumbar region: Secondary | ICD-10-CM | POA: Diagnosis not present

## 2014-06-01 DIAGNOSIS — M25562 Pain in left knee: Secondary | ICD-10-CM | POA: Diagnosis not present

## 2014-06-01 DIAGNOSIS — M5137 Other intervertebral disc degeneration, lumbosacral region: Secondary | ICD-10-CM | POA: Diagnosis not present

## 2014-06-01 DIAGNOSIS — M9901 Segmental and somatic dysfunction of cervical region: Secondary | ICD-10-CM | POA: Diagnosis not present

## 2014-06-01 DIAGNOSIS — M9902 Segmental and somatic dysfunction of thoracic region: Secondary | ICD-10-CM | POA: Diagnosis not present

## 2014-06-14 DIAGNOSIS — H34812 Central retinal vein occlusion, left eye: Secondary | ICD-10-CM | POA: Diagnosis not present

## 2014-06-15 DIAGNOSIS — M9901 Segmental and somatic dysfunction of cervical region: Secondary | ICD-10-CM | POA: Diagnosis not present

## 2014-06-15 DIAGNOSIS — M9903 Segmental and somatic dysfunction of lumbar region: Secondary | ICD-10-CM | POA: Diagnosis not present

## 2014-06-15 DIAGNOSIS — M5137 Other intervertebral disc degeneration, lumbosacral region: Secondary | ICD-10-CM | POA: Diagnosis not present

## 2014-06-15 DIAGNOSIS — M25562 Pain in left knee: Secondary | ICD-10-CM | POA: Diagnosis not present

## 2014-06-15 DIAGNOSIS — M9902 Segmental and somatic dysfunction of thoracic region: Secondary | ICD-10-CM | POA: Diagnosis not present

## 2014-06-18 ENCOUNTER — Other Ambulatory Visit: Payer: Self-pay | Admitting: Family Medicine

## 2014-06-22 ENCOUNTER — Other Ambulatory Visit (INDEPENDENT_AMBULATORY_CARE_PROVIDER_SITE_OTHER): Payer: Medicare Other

## 2014-06-22 DIAGNOSIS — M1711 Unilateral primary osteoarthritis, right knee: Secondary | ICD-10-CM

## 2014-06-22 DIAGNOSIS — E559 Vitamin D deficiency, unspecified: Secondary | ICD-10-CM | POA: Diagnosis not present

## 2014-06-22 DIAGNOSIS — E8881 Metabolic syndrome: Secondary | ICD-10-CM

## 2014-06-22 DIAGNOSIS — R5383 Other fatigue: Secondary | ICD-10-CM | POA: Diagnosis not present

## 2014-06-22 DIAGNOSIS — N400 Enlarged prostate without lower urinary tract symptoms: Secondary | ICD-10-CM | POA: Diagnosis not present

## 2014-06-22 DIAGNOSIS — R799 Abnormal finding of blood chemistry, unspecified: Secondary | ICD-10-CM

## 2014-06-22 DIAGNOSIS — M179 Osteoarthritis of knee, unspecified: Secondary | ICD-10-CM | POA: Diagnosis not present

## 2014-06-22 DIAGNOSIS — I10 Essential (primary) hypertension: Secondary | ICD-10-CM | POA: Diagnosis not present

## 2014-06-22 DIAGNOSIS — E785 Hyperlipidemia, unspecified: Secondary | ICD-10-CM | POA: Diagnosis not present

## 2014-06-22 LAB — POCT CBC
Granulocyte percent: 69.8 %G (ref 37–80)
HCT, POC: 47.8 % (ref 43.5–53.7)
Hemoglobin: 15.2 g/dL (ref 14.1–18.1)
LYMPH, POC: 2.5 (ref 0.6–3.4)
MCH, POC: 29 pg (ref 27–31.2)
MCHC: 31.9 g/dL (ref 31.8–35.4)
MCV: 90.9 fL (ref 80–97)
MPV: 9.4 fL (ref 0–99.8)
POC GRANULOCYTE: 6.4 (ref 2–6.9)
POC LYMPH PERCENT: 26.8 %L (ref 10–50)
Platelet Count, POC: 148 10*3/uL (ref 142–424)
RBC: 5.3 M/uL (ref 4.69–6.13)
RDW, POC: 14.6 %
WBC: 9.2 10*3/uL (ref 4.6–10.2)

## 2014-06-22 LAB — POCT GLYCOSYLATED HEMOGLOBIN (HGB A1C): Hemoglobin A1C: 5.8

## 2014-06-22 NOTE — Progress Notes (Signed)
Lab only 

## 2014-06-23 LAB — NMR, LIPOPROFILE
Cholesterol: 123 mg/dL (ref 100–199)
HDL CHOLESTEROL BY NMR: 49 mg/dL (ref >39)
HDL Particle Number: 26.9 umol/L — ABNORMAL LOW (ref >=30.5)
LDL PARTICLE NUMBER: 628 nmol/L (ref <1000)
LDL SIZE: 20.7 nm (ref >20.5)
LDL-C: 62 mg/dL (ref 0–99)
LP-IR Score: 29 (ref <=45)
Small LDL Particle Number: 228 nmol/L (ref <=527)
Triglycerides by NMR: 62 mg/dL (ref 0–149)

## 2014-06-23 LAB — HEPATIC FUNCTION PANEL
ALT: 13 IU/L (ref 0–44)
AST: 21 IU/L (ref 0–40)
Albumin: 3.8 g/dL (ref 3.5–4.7)
Alkaline Phosphatase: 83 IU/L (ref 39–117)
BILIRUBIN DIRECT: 0.22 mg/dL (ref 0.00–0.40)
TOTAL PROTEIN: 6.5 g/dL (ref 6.0–8.5)
Total Bilirubin: 0.7 mg/dL (ref 0.0–1.2)

## 2014-06-23 LAB — BMP8+EGFR
BUN / CREAT RATIO: 12 (ref 10–22)
BUN: 13 mg/dL (ref 8–27)
CO2: 28 mmol/L (ref 18–29)
Calcium: 9.5 mg/dL (ref 8.6–10.2)
Chloride: 103 mmol/L (ref 97–108)
Creatinine, Ser: 1.1 mg/dL (ref 0.76–1.27)
GFR calc Af Amer: 71 mL/min/{1.73_m2} (ref >59)
GFR calc non Af Amer: 61 mL/min/{1.73_m2} (ref >59)
Glucose: 125 mg/dL — ABNORMAL HIGH (ref 65–99)
Potassium: 4.7 mmol/L (ref 3.5–5.2)
SODIUM: 145 mmol/L — AB (ref 134–144)

## 2014-06-23 LAB — VITAMIN D 25 HYDROXY (VIT D DEFICIENCY, FRACTURES): Vit D, 25-Hydroxy: 53.6 ng/mL (ref 30.0–100.0)

## 2014-06-26 ENCOUNTER — Other Ambulatory Visit: Payer: Self-pay | Admitting: Family Medicine

## 2014-06-28 ENCOUNTER — Ambulatory Visit (INDEPENDENT_AMBULATORY_CARE_PROVIDER_SITE_OTHER): Payer: Medicare Other | Admitting: Family Medicine

## 2014-06-28 ENCOUNTER — Encounter: Payer: Self-pay | Admitting: Family Medicine

## 2014-06-28 VITALS — BP 120/68 | HR 94 | Temp 97.1°F | Ht 66.0 in | Wt 211.0 lb

## 2014-06-28 DIAGNOSIS — E785 Hyperlipidemia, unspecified: Secondary | ICD-10-CM

## 2014-06-28 DIAGNOSIS — N400 Enlarged prostate without lower urinary tract symptoms: Secondary | ICD-10-CM | POA: Diagnosis not present

## 2014-06-28 DIAGNOSIS — E559 Vitamin D deficiency, unspecified: Secondary | ICD-10-CM | POA: Diagnosis not present

## 2014-06-28 DIAGNOSIS — E8881 Metabolic syndrome: Secondary | ICD-10-CM | POA: Diagnosis not present

## 2014-06-28 DIAGNOSIS — I4891 Unspecified atrial fibrillation: Secondary | ICD-10-CM | POA: Diagnosis not present

## 2014-06-28 DIAGNOSIS — I10 Essential (primary) hypertension: Secondary | ICD-10-CM | POA: Diagnosis not present

## 2014-06-28 MED ORDER — QUINAPRIL HCL 10 MG PO TABS: 10.0000 mg | ORAL_TABLET | Freq: Every day | ORAL | Status: DC

## 2014-06-28 NOTE — Progress Notes (Signed)
Subjective:    Patient ID: Mark Wilkinson, male    DOB: 03/27/1928, 81 y.o.   MRN: [id number]  HPI Pt here for follow up and management of chronic medical problems which include hypertension and hyperlipidemia. He is taking medications regularly. The patient's wife comes with him to the visit. He does not admit to forgetfulness but she indicates that he is very forgetful. He does not want to take any medication for this. All of his lab work was reviewed with him and the biggest problem was his elevated blood sugar. Both he and his wife admit that he likes to eat cookies and he is been doing more sweets than he should've been doing. We will add a hemoglobin A1c to the current lab work. As usual the patient always has no complaints and says he is feeling well his wife says that he does hurt in his left knee. The right knee has been replaced. He is also requesting a refill on his quinapril.        Patient Active Problem List   Diagnosis Date Noted  . Atrial fibrillation 02/25/2014  . Metabolic syndrome 90/24/0973  . BPH (benign prostatic hyperplasia) 06/16/2013  . Chest pain 03/18/2013  . Acute encephalopathy 12/11/2012  . Hypotension, unspecified 12/11/2012  . Elevated troponin 12/11/2012  . Expected blood loss anemia 12/09/2012  . Obese 12/09/2012  . S/P right TKA 12/08/2012  . Preop cardiovascular exam 11/25/2012  . Essential hypertension, benign   . Other and unspecified hyperlipidemia   . Diaphragmatic hernia without mention of obstruction or gangrene   . Gout, unspecified   . Calculus of kidney   . [place], [place]   . Asymptomatic varicose veins   . Lumbago    Outpatient Encounter Prescriptions as of 06/28/2014  Medication Sig  . allopurinol (ZYLOPRIM) 300 MG tablet TAKE 1 TABLET DAILY  . atenolol (TENORMIN) 50 MG tablet TAKE 1 & 1/2 TABLETS BY MOUTH EVERY DAY.  Marland Kitchen atorvastatin (LIPITOR) 40 MG tablet TAKE 1 TABLET BY MOUTH EVERY DAY  . B Complex-C (SUPER B COMPLEX  PO) Take 1 tablet by mouth daily.  . Cholecalciferol (VITAMIN D3) 1000 UNITS CAPS Take 2,000 Units by mouth daily.   Marland Kitchen GLUCOSAMINE PO Take 1 tablet by mouth 2 (two) times daily.  . Multiple Vitamin (MULTIVITAMIN WITH MINERALS) TABS Take 1 tablet by mouth daily.  Marland Kitchen NEXIUM 40 MG capsule TAKE 1 CAPSULE DAILY  . niacin (NIASPAN) 500 MG CR tablet TAKE 3 TABLETS BY MOUTH AT BEDTIME  . Omega-3 Fatty Acids (FISH OIL) 1000 MG CAPS Take 1,000 mg by mouth 2 (two) times daily.   . quinapril (ACCUPRIL) 10 MG tablet TAKE 1 TABLET EVERY DAY  . Ranibizumab (LUCENTIS) 0.3 MG/0.05ML SOLN Place 1 application into the left eye. Injection q 6-8 weeks  . RAPAFLO 8 MG CAPS capsule Take 8 mg by mouth daily with breakfast.   . XARELTO 20 MG TABS tablet TAKE 1 TABLET BY MOUTH EVERY DAY WITH SUPPER     Review of Systems  Constitutional: Negative.   HENT: Negative.   Eyes: Negative.   Respiratory: Negative.   Cardiovascular: Negative.   Gastrointestinal: Negative.   Endocrine: Negative.   Genitourinary: Negative.   Musculoskeletal: Negative.   Skin: Negative.   Allergic/Immunologic: Negative.   Neurological: Negative.   Hematological: Negative.   Psychiatric/Behavioral: Negative.        Objective:   Physical Exam  Constitutional: He is oriented to person, place, and time. No distress.  The patient is pleasant and alert but forgetful at the same time. He never has any thing to complain about ever. He is smiling and is very dependent on his wife. He has a somewhat stooped posture. He uses a cane for stability  HENT:  Head: Normocephalic and atraumatic.  Right Ear: External ear normal.  Left Ear: External ear normal.  Nose: Nose normal.  Mouth/Throat: Oropharynx is clear and moist. No oropharyngeal exudate.  Eyes: Conjunctivae and EOM are normal. Pupils are equal, round, and reactive to light. Right eye exhibits no discharge. Left eye exhibits no discharge. No scleral icterus.  Neck: Normal range of  motion. Neck supple. No thyromegaly present.  No carotid bruits or anterior cervical adenopathy  Cardiovascular: Normal rate, normal heart sounds and intact distal pulses.   No murmur heard. The heart is irregular irregular at 60/m  Pulmonary/Chest: Effort normal and breath sounds normal. No respiratory distress. He has no wheezes. He has no rales. He exhibits no tenderness.  Abdominal: Soft. Bowel sounds are normal. He exhibits no mass. There is no tenderness. There is no rebound and no guarding.  Genitourinary: Rectum normal.  The prostate is very enlarged and there are no rectal masses.  Musculoskeletal: Normal range of motion. He exhibits no edema.  Lymphadenopathy:    He has no cervical adenopathy.  Neurological: He is alert and oriented to person, place, and time. He has normal reflexes. No cranial nerve deficit.  Skin: Skin is warm and dry. No rash noted. No erythema. No pallor.  Psychiatric: He has a normal mood and affect. His behavior is normal. Judgment and thought content normal.  Nursing note and vitals reviewed.  BP 120/68 mmHg  Pulse 94  Temp(Src) 97.1 F (36.2 C) (Oral)  Ht 5\' 6"  (1.676 m)  Wt 211 lb (95.709 kg)  BMI 34.07 kg/m2        Assessment & Plan:  1. BPH (benign prostatic hyperplasia) -Continue with Rapaflo  2. Essential hypertension, benign -Continue to monitor blood pressures at home - quinapril (ACCUPRIL) 10 MG tablet; Take 1 tablet (10 mg total) by mouth daily.  Dispense: 90 tablet; Refill: 1  3. Metabolic syndrome -Continue weight loss and diet habits to get blood sugar under better control  4. Atrial fibrillation, unspecified -Continue with current treatment and follow-up visit with cardiology  5. Vitamin D deficiency -Continue with vitamin D 3, 2000 daily  6. Hyperlipemia -Cholesterol numbers were excellent, continue current treatment  7. Memory issues -Consider starting Aricept in the future  Meds ordered this encounter  Medications   . RAPAFLO 8 MG CAPS capsule    Sig: Take 8 mg by mouth daily with breakfast.     Refill:  1  . quinapril (ACCUPRIL) 10 MG tablet    Sig: Take 1 tablet (10 mg total) by mouth daily.    Dispense:  90 tablet    Refill:  1   Patient Instructions                       Medicare Annual Wellness Visit  [facility] and the medical providers at [facility] strive to bring you the best medical care.  In doing so we not only want to address your current medical conditions and concerns but also to detect new conditions early and prevent illness, disease and health-related problems.    Medicare offers a yearly Wellness Visit which allows our clinical staff to assess your need for preventative services including  immunizations, lifestyle education, counseling to decrease risk of preventable diseases and screening for fall risk and other medical concerns.    This visit is provided free of charge (no copay) for all Medicare recipients. The clinical pharmacists at [facility] have begun to conduct these Wellness Visits which will also include a thorough review of all your medications.    As you primary medical provider recommend that you make an appointment for your Annual Wellness Visit if you have not done so already this year.  You may set up this appointment before you leave today or you may call back ([phone]) and schedule an appointment.  Please make sure when you call that you mention that you are scheduling your Annual Wellness Visit with the clinical pharmacist so that the appointment may be made for the proper length of time.     Continue current medications. Continue good therapeutic lifestyle changes which include good diet and exercise. Fall precautions discussed with patient. If an FOBT was given today- please return it to our front desk. If you are over 54 years old - you may need Prevnar 76 or the adult Pneumonia vaccine.  Flu Shots are still  available at our office. If you still haven't had one please call to set up a nurse visit to get one.   After your visit with [place] today you will receive a survey in the mail or online from [employer] regarding your care with [place]. Please take a moment to fill this out. Your feedback is very important to [place] as you can help [place] better understand your patient needs as well as improve your experience and satisfaction. WE CARE ABOUT YOU!!!   The patient should try to get as much exercise as possible He should drink as much fluid as possible and keep well hydrated If his memory problems continue to worsen please discuss with him about starting Aricept Otherwise continue current medications and continue to monitor blood pressures and pulse rates at home as you have been doing   Arrie Senate MD

## 2014-06-28 NOTE — Patient Instructions (Addendum)
Medicare Annual Wellness Visit  [facility] and the medical providers at [facility] strive to bring you the best medical care.  In doing so we not only want to address your current medical conditions and concerns but also to detect new conditions early and prevent illness, disease and health-related problems.    Medicare offers a yearly Wellness Visit which allows our clinical staff to assess your need for preventative services including immunizations, lifestyle education, counseling to decrease risk of preventable diseases and screening for fall risk and other medical concerns.    This visit is provided free of charge (no copay) for all Medicare recipients. The clinical pharmacists at [facility] have begun to conduct these Wellness Visits which will also include a thorough review of all your medications.    As you primary medical provider recommend that you make an appointment for your Annual Wellness Visit if you have not done so already this year.  You may set up this appointment before you leave today or you may call back ([phone]) and schedule an appointment.  Please make sure when you call that you mention that you are scheduling your Annual Wellness Visit with the clinical pharmacist so that the appointment may be made for the proper length of time.     Continue current medications. Continue good therapeutic lifestyle changes which include good diet and exercise. Fall precautions discussed with patient. If an FOBT was given today- please return it to our front desk. If you are over 92 years old - you may need Prevnar 65 or the adult Pneumonia vaccine.  Flu Shots are still available at our office. If you still haven't had one please call to set up a nurse visit to get one.   After your visit with [place] today you will receive a survey in the mail or online from [employer] regarding your care with [place]. Please take a moment to  fill this out. Your feedback is very important to [place] as you can help [place] better understand your patient needs as well as improve your experience and satisfaction. WE CARE ABOUT YOU!!!   The patient should try to get as much exercise as possible He should drink as much fluid as possible and keep well hydrated If his memory problems continue to worsen please discuss with him about starting Aricept Otherwise continue current medications and continue to monitor blood pressures and pulse rates at home as you have been doing

## 2014-06-29 ENCOUNTER — Other Ambulatory Visit: Payer: Self-pay | Admitting: Family Medicine

## 2014-06-29 DIAGNOSIS — M5137 Other intervertebral disc degeneration, lumbosacral region: Secondary | ICD-10-CM | POA: Diagnosis not present

## 2014-06-29 DIAGNOSIS — M9902 Segmental and somatic dysfunction of thoracic region: Secondary | ICD-10-CM | POA: Diagnosis not present

## 2014-06-29 DIAGNOSIS — M9901 Segmental and somatic dysfunction of cervical region: Secondary | ICD-10-CM | POA: Diagnosis not present

## 2014-06-29 DIAGNOSIS — M25562 Pain in left knee: Secondary | ICD-10-CM | POA: Diagnosis not present

## 2014-06-29 DIAGNOSIS — M9903 Segmental and somatic dysfunction of lumbar region: Secondary | ICD-10-CM | POA: Diagnosis not present

## 2014-07-06 ENCOUNTER — Other Ambulatory Visit: Payer: Self-pay | Admitting: Family Medicine

## 2014-07-08 ENCOUNTER — Other Ambulatory Visit: Payer: Medicare Other

## 2014-07-08 DIAGNOSIS — Z1212 Encounter for screening for malignant neoplasm of rectum: Secondary | ICD-10-CM

## 2014-07-08 NOTE — Addendum Note (Signed)
Addended by: Earlene Plater on: 07/08/2014 12:51 PM   Modules accepted: Orders

## 2014-07-08 NOTE — Progress Notes (Signed)
Lab only 

## 2014-07-10 LAB — FECAL OCCULT BLOOD, IMMUNOCHEMICAL: Fecal Occult Bld: NEGATIVE

## 2014-07-12 ENCOUNTER — Encounter: Payer: Self-pay | Admitting: Family Medicine

## 2014-07-13 DIAGNOSIS — M5137 Other intervertebral disc degeneration, lumbosacral region: Secondary | ICD-10-CM | POA: Diagnosis not present

## 2014-07-13 DIAGNOSIS — M9903 Segmental and somatic dysfunction of lumbar region: Secondary | ICD-10-CM | POA: Diagnosis not present

## 2014-07-13 DIAGNOSIS — M9902 Segmental and somatic dysfunction of thoracic region: Secondary | ICD-10-CM | POA: Diagnosis not present

## 2014-07-13 DIAGNOSIS — M9901 Segmental and somatic dysfunction of cervical region: Secondary | ICD-10-CM | POA: Diagnosis not present

## 2014-07-13 DIAGNOSIS — M25562 Pain in left knee: Secondary | ICD-10-CM | POA: Diagnosis not present

## 2014-07-15 ENCOUNTER — Other Ambulatory Visit: Payer: Self-pay | Admitting: Family Medicine

## 2014-07-26 DIAGNOSIS — H35352 Cystoid macular degeneration, left eye: Secondary | ICD-10-CM | POA: Diagnosis not present

## 2014-07-26 DIAGNOSIS — H34812 Central retinal vein occlusion, left eye: Secondary | ICD-10-CM | POA: Diagnosis not present

## 2014-07-27 DIAGNOSIS — M5137 Other intervertebral disc degeneration, lumbosacral region: Secondary | ICD-10-CM | POA: Diagnosis not present

## 2014-07-27 DIAGNOSIS — M25562 Pain in left knee: Secondary | ICD-10-CM | POA: Diagnosis not present

## 2014-07-27 DIAGNOSIS — M9902 Segmental and somatic dysfunction of thoracic region: Secondary | ICD-10-CM | POA: Diagnosis not present

## 2014-07-27 DIAGNOSIS — M9901 Segmental and somatic dysfunction of cervical region: Secondary | ICD-10-CM | POA: Diagnosis not present

## 2014-07-27 DIAGNOSIS — M9903 Segmental and somatic dysfunction of lumbar region: Secondary | ICD-10-CM | POA: Diagnosis not present

## 2014-08-10 DIAGNOSIS — M9902 Segmental and somatic dysfunction of thoracic region: Secondary | ICD-10-CM | POA: Diagnosis not present

## 2014-08-10 DIAGNOSIS — M9903 Segmental and somatic dysfunction of lumbar region: Secondary | ICD-10-CM | POA: Diagnosis not present

## 2014-08-10 DIAGNOSIS — M9904 Segmental and somatic dysfunction of sacral region: Secondary | ICD-10-CM | POA: Diagnosis not present

## 2014-08-10 DIAGNOSIS — M5430 Sciatica, unspecified side: Secondary | ICD-10-CM | POA: Diagnosis not present

## 2014-08-10 DIAGNOSIS — M25562 Pain in left knee: Secondary | ICD-10-CM | POA: Diagnosis not present

## 2014-08-24 DIAGNOSIS — M9904 Segmental and somatic dysfunction of sacral region: Secondary | ICD-10-CM | POA: Diagnosis not present

## 2014-08-24 DIAGNOSIS — M9902 Segmental and somatic dysfunction of thoracic region: Secondary | ICD-10-CM | POA: Diagnosis not present

## 2014-08-24 DIAGNOSIS — M5430 Sciatica, unspecified side: Secondary | ICD-10-CM | POA: Diagnosis not present

## 2014-08-24 DIAGNOSIS — M25562 Pain in left knee: Secondary | ICD-10-CM | POA: Diagnosis not present

## 2014-08-24 DIAGNOSIS — M9903 Segmental and somatic dysfunction of lumbar region: Secondary | ICD-10-CM | POA: Diagnosis not present

## 2014-09-07 DIAGNOSIS — M9904 Segmental and somatic dysfunction of sacral region: Secondary | ICD-10-CM | POA: Diagnosis not present

## 2014-09-07 DIAGNOSIS — M9902 Segmental and somatic dysfunction of thoracic region: Secondary | ICD-10-CM | POA: Diagnosis not present

## 2014-09-07 DIAGNOSIS — M5430 Sciatica, unspecified side: Secondary | ICD-10-CM | POA: Diagnosis not present

## 2014-09-07 DIAGNOSIS — M9903 Segmental and somatic dysfunction of lumbar region: Secondary | ICD-10-CM | POA: Diagnosis not present

## 2014-09-07 DIAGNOSIS — M25562 Pain in left knee: Secondary | ICD-10-CM | POA: Diagnosis not present

## 2014-09-15 DIAGNOSIS — H34812 Central retinal vein occlusion, left eye: Secondary | ICD-10-CM | POA: Diagnosis not present

## 2014-09-19 ENCOUNTER — Other Ambulatory Visit: Payer: Self-pay | Admitting: Family Medicine

## 2014-09-21 DIAGNOSIS — M9902 Segmental and somatic dysfunction of thoracic region: Secondary | ICD-10-CM | POA: Diagnosis not present

## 2014-09-21 DIAGNOSIS — M9903 Segmental and somatic dysfunction of lumbar region: Secondary | ICD-10-CM | POA: Diagnosis not present

## 2014-09-21 DIAGNOSIS — M5430 Sciatica, unspecified side: Secondary | ICD-10-CM | POA: Diagnosis not present

## 2014-09-21 DIAGNOSIS — M25562 Pain in left knee: Secondary | ICD-10-CM | POA: Diagnosis not present

## 2014-09-21 DIAGNOSIS — M9904 Segmental and somatic dysfunction of sacral region: Secondary | ICD-10-CM | POA: Diagnosis not present

## 2014-09-22 ENCOUNTER — Other Ambulatory Visit: Payer: Self-pay | Admitting: Family Medicine

## 2014-10-05 DIAGNOSIS — M9901 Segmental and somatic dysfunction of cervical region: Secondary | ICD-10-CM | POA: Diagnosis not present

## 2014-10-05 DIAGNOSIS — M9902 Segmental and somatic dysfunction of thoracic region: Secondary | ICD-10-CM | POA: Diagnosis not present

## 2014-10-05 DIAGNOSIS — M9903 Segmental and somatic dysfunction of lumbar region: Secondary | ICD-10-CM | POA: Diagnosis not present

## 2014-10-05 DIAGNOSIS — M5137 Other intervertebral disc degeneration, lumbosacral region: Secondary | ICD-10-CM | POA: Diagnosis not present

## 2014-10-05 DIAGNOSIS — M25562 Pain in left knee: Secondary | ICD-10-CM | POA: Diagnosis not present

## 2014-10-06 ENCOUNTER — Other Ambulatory Visit: Payer: Self-pay | Admitting: Family Medicine

## 2014-10-10 DIAGNOSIS — L570 Actinic keratosis: Secondary | ICD-10-CM | POA: Diagnosis not present

## 2014-10-10 DIAGNOSIS — D1801 Hemangioma of skin and subcutaneous tissue: Secondary | ICD-10-CM | POA: Diagnosis not present

## 2014-10-10 DIAGNOSIS — L821 Other seborrheic keratosis: Secondary | ICD-10-CM | POA: Diagnosis not present

## 2014-10-10 DIAGNOSIS — D225 Melanocytic nevi of trunk: Secondary | ICD-10-CM | POA: Diagnosis not present

## 2014-10-12 ENCOUNTER — Other Ambulatory Visit: Payer: Self-pay | Admitting: Family Medicine

## 2014-10-19 DIAGNOSIS — M9903 Segmental and somatic dysfunction of lumbar region: Secondary | ICD-10-CM | POA: Diagnosis not present

## 2014-10-19 DIAGNOSIS — M5137 Other intervertebral disc degeneration, lumbosacral region: Secondary | ICD-10-CM | POA: Diagnosis not present

## 2014-10-19 DIAGNOSIS — M25562 Pain in left knee: Secondary | ICD-10-CM | POA: Diagnosis not present

## 2014-10-19 DIAGNOSIS — M9902 Segmental and somatic dysfunction of thoracic region: Secondary | ICD-10-CM | POA: Diagnosis not present

## 2014-10-19 DIAGNOSIS — M9901 Segmental and somatic dysfunction of cervical region: Secondary | ICD-10-CM | POA: Diagnosis not present

## 2014-10-25 ENCOUNTER — Other Ambulatory Visit (INDEPENDENT_AMBULATORY_CARE_PROVIDER_SITE_OTHER): Payer: Medicare Other

## 2014-10-25 DIAGNOSIS — I10 Essential (primary) hypertension: Secondary | ICD-10-CM

## 2014-10-25 DIAGNOSIS — N400 Enlarged prostate without lower urinary tract symptoms: Secondary | ICD-10-CM

## 2014-10-25 DIAGNOSIS — E8881 Metabolic syndrome: Secondary | ICD-10-CM | POA: Diagnosis not present

## 2014-10-25 DIAGNOSIS — I4891 Unspecified atrial fibrillation: Secondary | ICD-10-CM

## 2014-10-25 DIAGNOSIS — E785 Hyperlipidemia, unspecified: Secondary | ICD-10-CM | POA: Diagnosis not present

## 2014-10-25 DIAGNOSIS — E559 Vitamin D deficiency, unspecified: Secondary | ICD-10-CM | POA: Diagnosis not present

## 2014-10-25 LAB — POCT CBC
Granulocyte percent: 66.6 %G (ref 37–80)
HCT, POC: 49 % (ref 43.5–53.7)
HEMOGLOBIN: 15.3 g/dL (ref 14.1–18.1)
Lymph, poc: 2.4 (ref 0.6–3.4)
MCH, POC: 28.4 pg (ref 27–31.2)
MCHC: 31.2 g/dL — AB (ref 31.8–35.4)
MCV: 90.9 fL (ref 80–97)
MPV: 9.8 fL (ref 0–99.8)
POC Granulocyte: 5.5 (ref 2–6.9)
POC LYMPH %: 29 % (ref 10–50)
Platelet Count, POC: 129 10*3/uL — AB (ref 142–424)
RBC: 5.39 M/uL (ref 4.69–6.13)
RDW, POC: 14.3 %
WBC: 8.2 10*3/uL (ref 4.6–10.2)

## 2014-10-25 NOTE — Addendum Note (Signed)
Addended by: Zannie Cove on: 10/25/2014 03:24 PM   Modules accepted: Orders

## 2014-10-25 NOTE — Progress Notes (Signed)
Lab only 

## 2014-10-26 LAB — BMP8+EGFR
BUN/Creatinine Ratio: 13 (ref 10–22)
BUN: 11 mg/dL (ref 8–27)
CO2: 29 mmol/L (ref 18–29)
Calcium: 9.4 mg/dL (ref 8.6–10.2)
Chloride: 106 mmol/L (ref 97–108)
Creatinine, Ser: 0.88 mg/dL (ref 0.76–1.27)
GFR calc Af Amer: 91 mL/min/{1.73_m2} (ref >59)
GFR calc non Af Amer: 78 mL/min/{1.73_m2} (ref >59)
Glucose: 120 mg/dL — ABNORMAL HIGH (ref 65–99)
Potassium: 4.9 mmol/L (ref 3.5–5.2)
Sodium: 148 mmol/L — ABNORMAL HIGH (ref 134–144)

## 2014-10-26 LAB — NMR, LIPOPROFILE
Cholesterol: 122 mg/dL (ref 100–199)
HDL CHOLESTEROL BY NMR: 49 mg/dL (ref >39)
HDL PARTICLE NUMBER: 28.3 umol/L — AB (ref >=30.5)
LDL Particle Number: 597 nmol/L (ref <1000)
LDL SIZE: 21.4 nm (ref >20.5)
LDL-C: 60 mg/dL (ref 0–99)
LP-IR Score: <25 (ref <=45)
Small LDL Particle Number: 192 nmol/L (ref <=527)
Triglycerides by NMR: 66 mg/dL (ref 0–149)

## 2014-10-26 LAB — HEPATIC FUNCTION PANEL
ALBUMIN: 4 g/dL (ref 3.5–4.7)
ALK PHOS: 81 IU/L (ref 39–117)
ALT: 12 IU/L (ref 0–44)
AST: 22 IU/L (ref 0–40)
BILIRUBIN TOTAL: 0.5 mg/dL (ref 0.0–1.2)
Bilirubin, Direct: 0.18 mg/dL (ref 0.00–0.40)
Total Protein: 6.6 g/dL (ref 6.0–8.5)

## 2014-10-26 LAB — VITAMIN D 25 HYDROXY (VIT D DEFICIENCY, FRACTURES): Vit D, 25-Hydroxy: 52.1 ng/mL (ref 30.0–100.0)

## 2014-10-27 DIAGNOSIS — H35352 Cystoid macular degeneration, left eye: Secondary | ICD-10-CM | POA: Diagnosis not present

## 2014-10-27 DIAGNOSIS — H34812 Central retinal vein occlusion, left eye: Secondary | ICD-10-CM | POA: Diagnosis not present

## 2014-11-01 ENCOUNTER — Encounter: Payer: Self-pay | Admitting: Family Medicine

## 2014-11-01 ENCOUNTER — Ambulatory Visit (INDEPENDENT_AMBULATORY_CARE_PROVIDER_SITE_OTHER): Payer: Medicare Other | Admitting: Family Medicine

## 2014-11-01 VITALS — BP 110/66 | HR 95 | Temp 97.0°F | Ht 66.0 in | Wt 212.0 lb

## 2014-11-01 DIAGNOSIS — E785 Hyperlipidemia, unspecified: Secondary | ICD-10-CM

## 2014-11-01 DIAGNOSIS — E559 Vitamin D deficiency, unspecified: Secondary | ICD-10-CM

## 2014-11-01 DIAGNOSIS — M1712 Unilateral primary osteoarthritis, left knee: Secondary | ICD-10-CM

## 2014-11-01 DIAGNOSIS — E8881 Metabolic syndrome: Secondary | ICD-10-CM

## 2014-11-01 DIAGNOSIS — I10 Essential (primary) hypertension: Secondary | ICD-10-CM | POA: Diagnosis not present

## 2014-11-01 DIAGNOSIS — N400 Enlarged prostate without lower urinary tract symptoms: Secondary | ICD-10-CM

## 2014-11-01 DIAGNOSIS — I4891 Unspecified atrial fibrillation: Secondary | ICD-10-CM | POA: Diagnosis not present

## 2014-11-01 NOTE — Patient Instructions (Addendum)
Medicare Annual Wellness Visit  [facility] and the medical providers at [facility] strive to bring you the best medical care.  In doing so we not only want to address your current medical conditions and concerns but also to detect new conditions early and prevent illness, disease and health-related problems.    Medicare offers a yearly Wellness Visit which allows our clinical staff to assess your need for preventative services including immunizations, lifestyle education, counseling to decrease risk of preventable diseases and screening for fall risk and other medical concerns.    This visit is provided free of charge (no copay) for all Medicare recipients. The clinical pharmacists at [facility] have begun to conduct these Wellness Visits which will also include a thorough review of all your medications.    As you primary medical provider recommend that you make an appointment for your Annual Wellness Visit if you have not done so already this year.  You may set up this appointment before you leave today or you may call back ([phone]) and schedule an appointment.  Please make sure when you call that you mention that you are scheduling your Annual Wellness Visit with the clinical pharmacist so that the appointment may be made for the proper length of time.     Continue current medications. Continue good therapeutic lifestyle changes which include good diet and exercise. Fall precautions discussed with patient. If an FOBT was given today- please return it to our front desk. If you are over 96 years old - you may need Prevnar 62 or the adult Pneumonia vaccine.  Flu Shots are still available at our office. If you still haven't had one please call to set up a nurse visit to get one.   After your visit with [place] today you will receive a survey in the mail or online from [employer] regarding your care with [place]. Please take a moment to  fill this out. Your feedback is very important to [place] as you can help [place] better understand your patient needs as well as improve your experience and satisfaction. WE CARE ABOUT YOU!!!   Continue with current treatment Exercise as much as possible If knee problems continue to progress and get worse, set up a visit with orthopedic surgeon and see if he still willing to operate on the left knee and we will arrange for the patient to see the cardiologist to get a cardiac okay.

## 2014-11-01 NOTE — Progress Notes (Signed)
Subjective:    Patient ID: Mark Wilkinson, male    DOB: 04-28-1928, 81 y.o.   MRN: [id number]  HPI Pt here for follow up and management of chronic medical problems which includes hypertension, hyperlipidemia, and Atrial fibrillation. He is taking medications regularly. The patient comes to the visit today with his wife and is mostly complaining of knee pain. He has no other complaints. When you ask the patient is having any symptoms he says no. During the course of the office visit he does complain of some back pain and stiffness and some right knee pain. The right knee is the knee that he had surgery on. The patient and his family are considering him having knee replacement and the other knee and if he continues to have problems this will be pushed to the forefront. He denies chest pain or shortness of breath or trouble swallowing or any problems with voiding. He denies any abdominal pain or heartburn. Recent lab work was reviewed with the patient and his wife during the visit in blood pressures from outside were brought in and these will be scanned into the record      Patient Active Problem List   Diagnosis Date Noted  . Atrial fibrillation 02/25/2014  . Metabolic syndrome 66/44/0347  . BPH (benign prostatic hyperplasia) 06/16/2013  . Chest pain 03/18/2013  . Acute encephalopathy 12/11/2012  . Hypotension, unspecified 12/11/2012  . Elevated troponin 12/11/2012  . Expected blood loss anemia 12/09/2012  . Obese 12/09/2012  . S/P right TKA 12/08/2012  . Preop cardiovascular exam 11/25/2012  . Essential hypertension, benign   . Other and unspecified hyperlipidemia   . Diaphragmatic hernia without mention of obstruction or gangrene   . Gout, unspecified   . Calculus of kidney   . [place], [place]   . Asymptomatic varicose veins   . Lumbago    Outpatient Encounter Prescriptions as of 11/01/2014  Medication Sig  . allopurinol (ZYLOPRIM) 300 MG tablet TAKE 1 TABLET DAILY  .  atenolol (TENORMIN) 50 MG tablet TAKE 1 & 1/2 TABLETS BY MOUTH EVERY DAY.  Marland Kitchen atorvastatin (LIPITOR) 40 MG tablet TAKE 1 TABLET BY MOUTH EVERY DAY  . B Complex-C (SUPER B COMPLEX PO) Take 1 tablet by mouth daily.  . Cholecalciferol (VITAMIN D3) 1000 UNITS CAPS Take 2,000 Units by mouth daily.   Marland Kitchen esomeprazole (NEXIUM) 40 MG capsule TAKE 1 CAPSULE DAILY  . GLUCOSAMINE PO Take 1 tablet by mouth 2 (two) times daily.  . Multiple Vitamin (MULTIVITAMIN WITH MINERALS) TABS Take 1 tablet by mouth daily.  . niacin (NIASPAN) 500 MG CR tablet TAKE 3 TABLETS BY MOUTH AT BEDTIME  . Omega-3 Fatty Acids (FISH OIL) 1000 MG CAPS Take 1,000 mg by mouth 2 (two) times daily.   . quinapril (ACCUPRIL) 10 MG tablet TAKE 1 TABLET EVERY DAY  . Ranibizumab (LUCENTIS) 0.3 MG/0.05ML SOLN Place 1 application into the left eye. Injection q 6-8 weeks  . RAPAFLO 8 MG CAPS capsule TAKE 1 CAPSULE DAILY WITH FOOD.  Marland Kitchen XARELTO 20 MG TABS tablet TAKE 1 TABLET BY MOUTH EVERY DAY WITH SUPPER  . [DISCONTINUED] quinapril (ACCUPRIL) 10 MG tablet Take 1 tablet (10 mg total) by mouth daily.   No facility-administered encounter medications on file as of 11/01/2014.      Review of Systems  Constitutional: Negative.   HENT: Negative.   Eyes: Negative.   Respiratory: Negative.   Cardiovascular: Negative.   Gastrointestinal: Negative.   Endocrine: Negative.   Genitourinary:  Negative.   Musculoskeletal: Positive for arthralgias (knee pain).  Skin: Negative.   Allergic/Immunologic: Negative.   Neurological: Negative.   Hematological: Negative.   Psychiatric/Behavioral: Negative.        Objective:   Physical Exam  Constitutional: He is oriented to person, place, and time. He appears well-developed and well-nourished. No distress.  The patient is alert and responds appropriately to questions asked of him.  HENT:  Head: Normocephalic and atraumatic.  Right Ear: External ear normal.  Left Ear: External ear normal.  Nose: Nose  normal.  Mouth/Throat: Oropharynx is clear and moist. No oropharyngeal exudate.  Eyes: Conjunctivae and EOM are normal. Pupils are equal, round, and reactive to light. Right eye exhibits no discharge. Left eye exhibits no discharge. No scleral icterus.  Neck: Normal range of motion. Neck supple. No thyromegaly present.  Cardiovascular: Normal rate and normal heart sounds.   No murmur heard. The distal pulses were more difficult to palpate. The heart is slightly irregular at 72 bpm.  Pulmonary/Chest: Effort normal and breath sounds normal. No respiratory distress. He has no wheezes. He has no rales. He exhibits no tenderness.  Clear anteriorly and posteriorly  Abdominal: Soft. Bowel sounds are normal. He exhibits no mass. There is no tenderness. There is no rebound and no guarding.  No abdominal tenderness or organ enlargement or bruits  Musculoskeletal: He exhibits no edema or tenderness.  The patient was somewhat kyphotic and slow to move because of knee pain and back pain.  Lymphadenopathy:    He has no cervical adenopathy.  Neurological: He is alert and oriented to person, place, and time. He has normal reflexes. No cranial nerve deficit.  Skin: Skin is warm and dry. No rash noted. No erythema. No pallor.  Psychiatric: He has a normal mood and affect. His behavior is normal. Judgment and thought content normal.  Nursing note and vitals reviewed.  BP 110/66 mmHg  Pulse 95  Temp(Src) 97 F (36.1 C) (Oral)  Ht 5\' 6"  (1.676 m)  Wt 212 lb (96.163 kg)  BMI 34.23 kg/m2        Assessment & Plan:  1. BPH (benign prostatic hyperplasia) -The patient is having no problems with passing his water.  2. Essential hypertension, benign -Both the outside blood pressures in the blood pressure in the office were good today and he will continue with current treatment  3. Metabolic syndrome -He will continue with as much exercise as possible and watch his diet as closely as possible  4. Atrial  fibrillation, unspecified -The heart was slightly irregular irregular today at 72/m -If the patient decides on future knee surgery he will need a cardiac clearance again.  5. Vitamin D deficiency -The vitamin D level is good he should continue with current treatment  6. Hyperlipemia -All of his cholesterol numbers were excellent and he should continue with current treatment  7. Primary osteoarthritis of left knee -He will continue to watch his left knee pain and if problems continue the family will schedule him for a visit for evaluation of placement of left knee pending cardiac approval  No orders of the defined types were placed in this encounter.   Patient Instructions                       Medicare Annual Wellness Visit  [facility] and the medical providers at [facility] strive to bring you the best medical care.  In doing so we not only want  to address your current medical conditions and concerns but also to detect new conditions early and prevent illness, disease and health-related problems.    Medicare offers a yearly Wellness Visit which allows our clinical staff to assess your need for preventative services including immunizations, lifestyle education, counseling to decrease risk of preventable diseases and screening for fall risk and other medical concerns.    This visit is provided free of charge (no copay) for all Medicare recipients. The clinical pharmacists at [facility] have begun to conduct these Wellness Visits which will also include a thorough review of all your medications.    As you primary medical provider recommend that you make an appointment for your Annual Wellness Visit if you have not done so already this year.  You may set up this appointment before you leave today or you may call back ([phone]) and schedule an appointment.  Please make sure when you call that you mention that you are scheduling your Annual  Wellness Visit with the clinical pharmacist so that the appointment may be made for the proper length of time.     Continue current medications. Continue good therapeutic lifestyle changes which include good diet and exercise. Fall precautions discussed with patient. If an FOBT was given today- please return it to our front desk. If you are over 57 years old - you may need Prevnar 2 or the adult Pneumonia vaccine.  Flu Shots are still available at our office. If you still haven't had one please call to set up a nurse visit to get one.   After your visit with [place] today you will receive a survey in the mail or online from [employer] regarding your care with [place]. Please take a moment to fill this out. Your feedback is very important to [place] as you can help [place] better understand your patient needs as well as improve your experience and satisfaction. WE CARE ABOUT YOU!!!   Continue with current treatment Exercise as much as possible If knee problems continue to progress and get worse, set up a visit with orthopedic surgeon and see if he still willing to operate on the left knee and we will arrange for the patient to see the cardiologist to get a cardiac okay.   Arrie Senate MD

## 2014-11-02 DIAGNOSIS — M9901 Segmental and somatic dysfunction of cervical region: Secondary | ICD-10-CM | POA: Diagnosis not present

## 2014-11-02 DIAGNOSIS — M9902 Segmental and somatic dysfunction of thoracic region: Secondary | ICD-10-CM | POA: Diagnosis not present

## 2014-11-02 DIAGNOSIS — M5137 Other intervertebral disc degeneration, lumbosacral region: Secondary | ICD-10-CM | POA: Diagnosis not present

## 2014-11-02 DIAGNOSIS — M9903 Segmental and somatic dysfunction of lumbar region: Secondary | ICD-10-CM | POA: Diagnosis not present

## 2014-11-02 DIAGNOSIS — M25562 Pain in left knee: Secondary | ICD-10-CM | POA: Diagnosis not present

## 2014-11-16 ENCOUNTER — Other Ambulatory Visit: Payer: Self-pay | Admitting: Family Medicine

## 2014-11-16 DIAGNOSIS — M9901 Segmental and somatic dysfunction of cervical region: Secondary | ICD-10-CM | POA: Diagnosis not present

## 2014-11-16 DIAGNOSIS — M5137 Other intervertebral disc degeneration, lumbosacral region: Secondary | ICD-10-CM | POA: Diagnosis not present

## 2014-11-16 DIAGNOSIS — M25562 Pain in left knee: Secondary | ICD-10-CM | POA: Diagnosis not present

## 2014-11-16 DIAGNOSIS — M9902 Segmental and somatic dysfunction of thoracic region: Secondary | ICD-10-CM | POA: Diagnosis not present

## 2014-11-16 DIAGNOSIS — M9903 Segmental and somatic dysfunction of lumbar region: Secondary | ICD-10-CM | POA: Diagnosis not present

## 2014-11-30 DIAGNOSIS — M9902 Segmental and somatic dysfunction of thoracic region: Secondary | ICD-10-CM | POA: Diagnosis not present

## 2014-11-30 DIAGNOSIS — M5137 Other intervertebral disc degeneration, lumbosacral region: Secondary | ICD-10-CM | POA: Diagnosis not present

## 2014-11-30 DIAGNOSIS — M9903 Segmental and somatic dysfunction of lumbar region: Secondary | ICD-10-CM | POA: Diagnosis not present

## 2014-11-30 DIAGNOSIS — M25562 Pain in left knee: Secondary | ICD-10-CM | POA: Diagnosis not present

## 2014-11-30 DIAGNOSIS — M9901 Segmental and somatic dysfunction of cervical region: Secondary | ICD-10-CM | POA: Diagnosis not present

## 2014-12-07 DIAGNOSIS — Z96651 Presence of right artificial knee joint: Secondary | ICD-10-CM | POA: Diagnosis not present

## 2014-12-07 DIAGNOSIS — M1712 Unilateral primary osteoarthritis, left knee: Secondary | ICD-10-CM | POA: Diagnosis not present

## 2014-12-07 DIAGNOSIS — Z471 Aftercare following joint replacement surgery: Secondary | ICD-10-CM | POA: Diagnosis not present

## 2014-12-07 DIAGNOSIS — M25562 Pain in left knee: Secondary | ICD-10-CM | POA: Diagnosis not present

## 2014-12-09 ENCOUNTER — Telehealth: Payer: Self-pay | Admitting: *Deleted

## 2014-12-09 ENCOUNTER — Ambulatory Visit (INDEPENDENT_AMBULATORY_CARE_PROVIDER_SITE_OTHER): Payer: Medicare Other | Admitting: Cardiology

## 2014-12-09 ENCOUNTER — Encounter: Payer: Self-pay | Admitting: Cardiology

## 2014-12-09 VITALS — BP 118/78 | HR 87 | Ht 72.0 in | Wt 211.3 lb

## 2014-12-09 DIAGNOSIS — I481 Persistent atrial fibrillation: Secondary | ICD-10-CM

## 2014-12-09 DIAGNOSIS — I4819 Other persistent atrial fibrillation: Secondary | ICD-10-CM

## 2014-12-09 DIAGNOSIS — I10 Essential (primary) hypertension: Secondary | ICD-10-CM | POA: Diagnosis not present

## 2014-12-09 NOTE — Patient Instructions (Signed)
Your physician wants you to follow-up in: 1 Year. You will receive a reminder letter in the mail two months in advance. If you don't receive a letter, please call our office to schedule the follow-up appointment.  

## 2014-12-09 NOTE — Progress Notes (Signed)
HPI The patient presents for preoperative evaluation prior to having knee surgery. I saw him two years ago the same thing. He had a negative stress perfusion study. Since that time he did go into atrial fibrillation but he tolerates this. He has had no significant symptoms and tolerates anticoagulation.   He has had progressive dementia. However, he gets around in the house or out with a cane.   The patient denies any new symptoms such as chest discomfort, neck or arm discomfort. There has been no new shortness of breath, PND or orthopnea. There have been no reported palpitations, presyncope or syncope.   Allergies  Allergen Reactions  . Penicillins Other (See Comments)    Unknown     Current Outpatient Prescriptions  Medication Sig Dispense Refill  . allopurinol (ZYLOPRIM) 300 MG tablet TAKE 1 TABLET DAILY 90 tablet 0  . atenolol (TENORMIN) 50 MG tablet TAKE 1 & 1/2 TABLETS BY MOUTH EVERY DAY. 135 tablet 1  . atorvastatin (LIPITOR) 40 MG tablet TAKE 1 TABLET BY MOUTH EVERY DAY 90 tablet 0  . B Complex-C (SUPER B COMPLEX PO) Take 1 tablet by mouth daily.    . Cholecalciferol (VITAMIN D3) 1000 UNITS CAPS Take 2,000 Units by mouth daily.     Marland Kitchen esomeprazole (NEXIUM) 40 MG capsule TAKE 1 CAPSULE DAILY 90 capsule 1  . GLUCOSAMINE PO Take 1 tablet by mouth 2 (two) times daily.    . Multiple Vitamin (MULTIVITAMIN WITH MINERALS) TABS Take 1 tablet by mouth daily.    . niacin (NIASPAN) 500 MG CR tablet TAKE 3 TABLETS BY MOUTH AT BEDTIME 270 tablet 1  . Omega-3 Fatty Acids (FISH OIL) 1000 MG CAPS Take 1,000 mg by mouth 2 (two) times daily.     . quinapril (ACCUPRIL) 10 MG tablet TAKE 1 TABLET EVERY DAY 90 tablet 1  . Ranibizumab (LUCENTIS) 0.3 MG/0.05ML SOLN Place 1 application into the left eye. Injection q 6-8 weeks    . RAPAFLO 8 MG CAPS capsule TAKE 1 CAPSULE DAILY WITH FOOD. 90 capsule 0  . XARELTO 20 MG TABS tablet TAKE 1 TABLET BY MOUTH EVERY DAY WITH SUPPER 30 tablet 2   No current  facility-administered medications for this visit.    Past Medical History  Diagnosis Date  . Essential hypertension, benign   . Other and unspecified hyperlipidemia   . Diaphragmatic hernia without mention of obstruction or gangrene   . Gout, unspecified   . Calculus of kidney   . Asymptomatic varicose veins   . Lumbago   . PONV (postoperative nausea and vomiting)   . Dementia   . GERD (gastroesophageal reflux disease)   . Arthritis     "knees, hips, back" (03/18/2013)  . Chronic lower back pain   . Atrial fibrillation     Past Surgical History  Procedure Laterality Date  . Appendectomy    . Cholecystectomy    . Cystoscopy with retrograde pyelogram, ureteroscopy and stent placement Left 08/13/2012    Procedure: [place], URETEROSCOPY AND STENT PLACEMENT;  Surgeon: Alexis Frock, MD;  Location: [facility];  Service: Urology;  Laterality: Left;  . Cataract extraction w/ intraocular lens implant Left 2000's  . Total knee arthroplasty Right 12/08/2012    Procedure: RIGHT TOTAL KNEE ARTHROPLASTY;  Surgeon: [name], MD;  Location: [facility];  Service: Orthopedics;  Laterality: Right;  . Tonsillectomy and adenoidectomy      "no tubes" (03/18/2013)  . Toe amputation Right ?1970's    "  shot his toe off in hunting accident" (03/18/2013)  . Cystoscopy w/ stone manipulation      "twice" (03/18/2013)    ROS:  Positive for back and knee pain, hard of hearing  Otherwise as stated in the HPI and negative for all other systems.  PHYSICAL EXAM BP 118/78 mmHg  Pulse 87  Ht 6' (1.829 m)  Wt 211 lb 4.8 oz (95.845 kg)  BMI 28.65 kg/m2 GENERAL:  Well appearing HEENT:  Pupils equal round and reactive, fundi not visualized, oral mucosa unremarkable NECK:  No jugular venous distention, waveform within normal limits, carotid upstroke brisk and symmetric, no bruits, no thyromegaly LYMPHATICS:  No cervical, inguinal adenopathy LUNGS:  Clear to  auscultation bilaterally BACK:  No CVA tenderness CHEST:  Unremarkable HEART:  PMI not displaced or sustained,S1 and S2 within normal limits, no S3, no clicks, no rubs, no murmurs, irregular ABD:  Flat, positive bowel sounds normal in frequency in pitch, no bruits, no rebound, no guarding, no midline pulsatile mass, no hepatomegaly, no splenomegaly EXT:  2 plus pulses throughout, no edema, no cyanosis no clubbing SKIN:  No rashes no nodules NEURO:  Cranial nerves II through XII grossly intact, motor grossly intact throughout PSYCH:  Pleaseantly confused.   EKG:  Atrial fib rate 87 , poor anterior R wave progression. 12/09/2014  ASSESSMENT AND PLAN   PREOP:  The patient has a had no new symptoms since stress perfusion study in 2014.  Therefore, based on ACC/AHA guidelines, the patient would be at acceptable risk for the planned procedure without further cardiovascular testing.  HTN:  The blood pressure is at target. No change in medications is indicated. We will continue with therapeutic lifestyle changes (TLC).  ATRIAL FIB:  He can stop his Xarelto couple of days prior to the procedure and no bridging is indicated. He can start per orthopedic protocol after surgery. Mark Wilkinson has a CHA2DS2 - VASc score of 3 with a risk of stroke of 3.2% .

## 2014-12-09 NOTE — Telephone Encounter (Signed)
Requesting surgical clearance:   1. Type of surgery: Total Knee Arthroplasty  2. Surgeon: Dr Paralee Cancel  3. Surgical date: 01/16/2015  4. Medications that need to be help: Xarelto  He can stop his Xarelto 2 of days prior to the procedure and no bridging is indicated. He can start per orthopedic protocol after surgery.

## 2014-12-12 ENCOUNTER — Other Ambulatory Visit: Payer: Self-pay | Admitting: *Deleted

## 2014-12-12 MED ORDER — ATENOLOL 50 MG PO TABS: 25.0000 mg | ORAL_TABLET | Freq: Two times a day (BID) | ORAL | Status: DC

## 2014-12-14 DIAGNOSIS — M9903 Segmental and somatic dysfunction of lumbar region: Secondary | ICD-10-CM | POA: Diagnosis not present

## 2014-12-14 DIAGNOSIS — M25562 Pain in left knee: Secondary | ICD-10-CM | POA: Diagnosis not present

## 2014-12-14 DIAGNOSIS — M9901 Segmental and somatic dysfunction of cervical region: Secondary | ICD-10-CM | POA: Diagnosis not present

## 2014-12-14 DIAGNOSIS — M9902 Segmental and somatic dysfunction of thoracic region: Secondary | ICD-10-CM | POA: Diagnosis not present

## 2014-12-14 DIAGNOSIS — M5137 Other intervertebral disc degeneration, lumbosacral region: Secondary | ICD-10-CM | POA: Diagnosis not present

## 2014-12-15 ENCOUNTER — Encounter: Payer: Self-pay | Admitting: *Deleted

## 2014-12-15 DIAGNOSIS — H34812 Central retinal vein occlusion, left eye: Secondary | ICD-10-CM | POA: Diagnosis not present

## 2014-12-16 ENCOUNTER — Other Ambulatory Visit: Payer: Self-pay | Admitting: Family Medicine

## 2014-12-19 ENCOUNTER — Other Ambulatory Visit: Payer: Self-pay | Admitting: Family Medicine

## 2014-12-20 DIAGNOSIS — M1712 Unilateral primary osteoarthritis, left knee: Secondary | ICD-10-CM | POA: Diagnosis not present

## 2014-12-28 DIAGNOSIS — M9901 Segmental and somatic dysfunction of cervical region: Secondary | ICD-10-CM | POA: Diagnosis not present

## 2014-12-28 DIAGNOSIS — M5137 Other intervertebral disc degeneration, lumbosacral region: Secondary | ICD-10-CM | POA: Diagnosis not present

## 2014-12-28 DIAGNOSIS — M9903 Segmental and somatic dysfunction of lumbar region: Secondary | ICD-10-CM | POA: Diagnosis not present

## 2014-12-28 DIAGNOSIS — M9902 Segmental and somatic dysfunction of thoracic region: Secondary | ICD-10-CM | POA: Diagnosis not present

## 2014-12-28 DIAGNOSIS — M25562 Pain in left knee: Secondary | ICD-10-CM | POA: Diagnosis not present

## 2014-12-29 ENCOUNTER — Other Ambulatory Visit: Payer: Self-pay | Admitting: Nurse Practitioner

## 2014-12-30 DIAGNOSIS — H40013 Open angle with borderline findings, low risk, bilateral: Secondary | ICD-10-CM | POA: Diagnosis not present

## 2014-12-30 DIAGNOSIS — H35352 Cystoid macular degeneration, left eye: Secondary | ICD-10-CM | POA: Diagnosis not present

## 2014-12-30 DIAGNOSIS — Z961 Presence of intraocular lens: Secondary | ICD-10-CM | POA: Diagnosis not present

## 2014-12-30 DIAGNOSIS — H34812 Central retinal vein occlusion, left eye: Secondary | ICD-10-CM | POA: Diagnosis not present

## 2014-12-30 DIAGNOSIS — H2511 Age-related nuclear cataract, right eye: Secondary | ICD-10-CM | POA: Diagnosis not present

## 2015-01-04 NOTE — H&P (Signed)
TOTAL KNEE ADMISSION H&P  Patient is being admitted for left total knee arthroplasty.  Subjective:  Chief Complaint:     Left knee primary OA / pain  HPI: Mark Wilkinson, 81 y.o. male, has a history of pain and functional disability in the left knee due to arthritis and has failed non-surgical conservative treatments for greater than 12 weeks to include NSAID's and/or analgesics, corticosteriod injections, viscosupplementation injections, use of assistive devices and activity modification.  Onset of symptoms was gradual, starting >10 years ago with gradually worsening course since that time. The patient noted prior procedures on the knee to include  arthroplasty on the right knee(s).  Patient currently rates pain in the left knee(s) at 7 out of 10 with activity. Patient has worsening of pain with activity and weight bearing, pain that interferes with activities of daily living, pain with passive range of motion, crepitus and joint swelling.  Patient has evidence of periarticular osteophytes and joint space narrowing by imaging studies.  There is no active infection.  Risks, benefits and expectations were discussed with the patient.  Risks including but not limited to the risk of anesthesia, blood clots, nerve damage, blood vessel damage, failure of the prosthesis, infection and up to and including death.  Patient understand the risks, benefits and expectations and wishes to proceed with surgery.   PCP: Redge Gainer, MD  D/C Plans:      Home with HHPT  Post-op Meds:       No Rx given   Tranexamic Acid:      To be given - IV  Decadron:      Is to be given  FYI:     [facility]  -  HHPT, No DME needs  Xarelto post-op   (on pre-op nightly,  start 10 mg night of surgery then 20 mg once home)  Norco post-op   Patient Active Problem List   Diagnosis Date Noted  . Atrial fibrillation, persistent 02/25/2014  . Metabolic syndrome 88/50/2774  . BPH (benign prostatic hyperplasia) 06/16/2013  . Chest pain  03/18/2013  . Acute encephalopathy 12/11/2012  . Hypotension, unspecified 12/11/2012  . Elevated troponin 12/11/2012  . Expected blood loss anemia 12/09/2012  . Obese 12/09/2012  . S/P right TKA 12/08/2012  . Preop cardiovascular exam 11/25/2012  . Essential hypertension, benign   . Other and unspecified hyperlipidemia   . Diaphragmatic hernia without mention of obstruction or gangrene   . Gout, unspecified   . Calculus of kidney   . [place], [place]   . Asymptomatic varicose veins   . Lumbago    Past Medical History  Diagnosis Date  . Essential hypertension, benign   . Other and unspecified hyperlipidemia   . Diaphragmatic hernia without mention of obstruction or gangrene   . Gout, unspecified   . Calculus of kidney   . Asymptomatic varicose veins   . Lumbago   . PONV (postoperative nausea and vomiting)   . Dementia   . GERD (gastroesophageal reflux disease)   . Arthritis     "knees, hips, back" (03/18/2013)  . Chronic lower back pain   . Atrial fibrillation     Past Surgical History  Procedure Laterality Date  . Appendectomy    . Cholecystectomy    . Cystoscopy with retrograde pyelogram, ureteroscopy and stent placement Left 08/13/2012    Procedure: [place], URETEROSCOPY AND STENT PLACEMENT;  Surgeon: Alexis Frock, MD;  Location[facility];  Service: Urology;  Laterality: Left;  .  Cataract extraction w/ intraocular lens implant Left 2000's  . Total knee arthroplasty Right 12/08/2012    Procedure: RIGHT TOTAL KNEE ARTHROPLASTY;  Surgeon: [name], MD;  Location: [facility];  Service: Orthopedics;  Laterality: Right;  . Tonsillectomy and adenoidectomy      "no tubes" (03/18/2013)  . Toe amputation Right ?1970's    "shot his toe off in hunting accident" (03/18/2013)  . Cystoscopy w/ stone manipulation      "twice" (03/18/2013)    No prescriptions prior to admission   Allergies  Allergen Reactions  .  Penicillins Other (See Comments)    Unknown     Social History  Substance Use Topics  . Smoking status: Former Smoker -- 0.12 packs/day for 10 years    Types: Cigarettes    Quit date: 12/11/1960  . Smokeless tobacco: Never Used  . Alcohol Use: No    Family History  Problem Relation Age of Onset  . Heart disease Mother     Died in her 62s  . Hypertension Mother   . Stroke Father   . CAD Brother     MI 86s     Review of Systems  Constitutional: Negative.   HENT: Negative.   Eyes: Negative.   Respiratory: Negative.   Cardiovascular: Negative.   Gastrointestinal: Positive for heartburn.  Genitourinary: Negative.   Musculoskeletal: Positive for back pain and joint pain.  Skin: Negative.   Neurological: Negative.   Endo/Heme/Allergies: Negative.   Psychiatric/Behavioral: Positive for memory loss.    Objective:  Physical Exam  Constitutional: He appears well-developed and well-nourished.  HENT:  Head: Normocephalic.  Eyes: Pupils are equal, round, and reactive to light.  Neck: Neck supple. No JVD present. No tracheal deviation present. No thyromegaly present.  Cardiovascular: Normal rate, regular rhythm, normal heart sounds and intact distal pulses.   Respiratory: Effort normal and breath sounds normal. No stridor. No respiratory distress. He has no wheezes.  GI: Soft. There is no tenderness. There is no guarding.  Lymphadenopathy:    He has no cervical adenopathy.  Neurological: He is alert.  Skin: Skin is warm and dry.  Psychiatric: He has a normal mood and affect.     Labs:  Estimated body mass index is 34.23 kg/(m^2) as calculated from the following:   Height as of 11/01/14: 5\' 6"  (1.676 m).   Weight as of 11/01/14: 96.163 kg (212 lb).   Imaging Review Plain radiographs demonstrate severe degenerative joint disease of the left knee(s). The overall alignment is neutral. The bone quality appears to be good for age and reported activity  level.  Assessment/Plan:  End stage arthritis, left knee   The patient history, physical examination, clinical judgment of the provider and imaging studies are consistent with end stage degenerative joint disease of the left knee(s) and total knee arthroplasty is deemed medically necessary. The treatment options including medical management, injection therapy arthroscopy and arthroplasty were discussed at length. The risks and benefits of total knee arthroplasty were presented and reviewed. The risks due to aseptic loosening, infection, stiffness, patella tracking problems, thromboembolic complications and other imponderables were discussed. The patient acknowledged the explanation, agreed to proceed with the plan and consent was signed. Patient is being admitted for inpatient treatment for surgery, pain control, PT, OT, prophylactic antibiotics, VTE prophylaxis, progressive ambulation and ADL's and discharge planning. The patient is planning to be discharged home with home health services.     [place] Pugh Doil Kamara   PA-C  01/04/2015, 12:27 PM

## 2015-01-05 NOTE — Patient Instructions (Addendum)
Mark Wilkinson  01/05/2015   Your procedure is scheduled on:    01/16/2015    Report to [facility] Main  Entrance take [facility]  elevators to 3rd floor to  [place] at     7:30  AM.  Call this number if you have problems the morning of surgery [phone]   Remember: ONLY 1 PERSON MAY GO WITH YOU TO SHORT STAY TO GET  READY MORNING OF [name].  Do not eat food or drink liquids :After Midnight.             Xarelto - per Dr. Rosezella [place] instructions     Take these medicines the morning of surgery with A SIP OF WATER:  Allopurinol, Atenolol ( Tenormin), Nexium                                You may not have any metal on your body including hair pins and              piercings  Do not wear jewelry,, lotions, powders or perfumes, deodorant                          Men may shave face and neck.   Do not bring valuables to the hospital. [name].  Contacts, dentures or bridgework may not be worn into surgery.  Leave suitcase in the car. After surgery it may be brought to your room.       Special Instructions: coughing and deep breathing exercises, leg exercises               Please read over the following fact sheets you were given: _____________________________________________________________________             [facility] - Preparing for Surgery Before surgery, you can play an important role.  Because skin is not sterile, your skin needs to be as free of germs as possible.  You can reduce the number of germs on your skin by washing with CHG (chlorahexidine gluconate) soap before surgery.  CHG is an antiseptic cleaner which kills germs and bonds with the skin to continue killing germs even after washing. Please DO NOT use if you have an allergy to CHG or antibacterial soaps.  If your skin becomes reddened/irritated stop using the CHG and inform your nurse when you arrive at Short Stay. Do not shave  (including legs and underarms) for at least 48 hours prior to the first CHG shower.  You may shave your face/neck. Please follow these instructions carefully:  1.  Shower with CHG Soap the night before surgery and the  morning of Surgery.  2.  If you choose to wash your hair, wash your hair first as usual with your  normal  shampoo.  3.  After you shampoo, rinse your hair and body thoroughly to remove the  shampoo.                           4.  Use CHG as you would any other liquid soap.  You can apply chg directly  to the skin and wash  Gently with a scrungie or clean washcloth.  5.  Apply the CHG Soap to your body ONLY FROM THE NECK DOWN.   Do not use on face/ open                           Wound or open sores. Avoid contact with eyes, ears mouth and genitals (private parts).                       Wash face,  Genitals (private parts) with your normal soap.             6.  Wash thoroughly, paying special attention to the area where your surgery  will be performed.  7.  Thoroughly rinse your body with warm water from the neck down.  8.  DO NOT shower/wash with your normal soap after using and rinsing off  the CHG Soap.                9.  Pat yourself dry with a clean towel.            10.  Wear clean pajamas.            11.  Place clean sheets on your bed the night of your first shower and do not  sleep with pets. Day of Surgery : Do not apply any lotions/deodorants the morning of surgery.  Please wear clean clothes to the hospital/surgery center.  FAILURE TO FOLLOW THESE INSTRUCTIONS MAY RESULT IN THE CANCELLATION OF YOUR SURGERY PATIENT SIGNATURE_________________________________  NURSE SIGNATURE__________________________________  ________________________________________________________________________  WHAT IS A BLOOD TRANSFUSION? Blood Transfusion Information  A transfusion is the replacement of blood or some of its parts. Blood is made up of multiple cells which  provide different functions.  Red blood cells carry oxygen and are used for blood loss replacement.  White blood cells fight against infection.  Platelets control bleeding.  Plasma helps clot blood.  Other blood products are available for specialized needs, such as hemophilia or other clotting disorders. BEFORE THE TRANSFUSION  Who gives blood for transfusions?   Healthy volunteers who are fully evaluated to make sure their blood is safe. This is blood bank blood. Transfusion therapy is the safest it has ever been in the practice of medicine. Before blood is taken from a donor, a complete history is taken to make sure that person has no history of diseases nor engages in risky social behavior (examples are intravenous drug use or sexual activity with multiple partners). The donor's travel history is screened to minimize risk of transmitting infections, such as malaria. The donated blood is tested for signs of infectious diseases, such as HIV and hepatitis. The blood is then tested to be sure it is compatible with you in order to minimize the chance of a transfusion reaction. If you or a relative donates blood, this is often done in anticipation of surgery and is not appropriate for emergency situations. It takes many days to process the donated blood. RISKS AND COMPLICATIONS Although transfusion therapy is very safe and saves many lives, the main dangers of transfusion include:  1. Getting an infectious disease. 2. Developing a transfusion reaction. This is an allergic reaction to something in the blood you were given. Every precaution is taken to prevent this. The decision to have a blood transfusion has been considered carefully by your caregiver before blood is given. Blood is not given unless the benefits outweigh  the risks. AFTER THE TRANSFUSION  Right after receiving a blood transfusion, you will usually feel much better and more energetic. This is especially true if your red blood cells  have gotten low (anemic). The transfusion raises the level of the red blood cells which carry oxygen, and this usually causes an energy increase.  The nurse administering the transfusion will monitor you carefully for complications. HOME CARE INSTRUCTIONS  No special instructions are needed after a transfusion. You may find your energy is better. Speak with your caregiver about any limitations on activity for underlying diseases you may have. SEEK MEDICAL CARE IF:   Your condition is not improving after your transfusion.  You develop redness or irritation at the intravenous (IV) site. SEEK IMMEDIATE MEDICAL CARE IF:  Any of the following symptoms occur over the next 12 hours:  Shaking chills.  You have a temperature by mouth above 102 F (38.9 C), not controlled by medicine.  Chest, back, or muscle pain.  People around you feel you are not acting correctly or are confused.  Shortness of breath or difficulty breathing.  Dizziness and fainting.  You get a rash or develop hives.  You have a decrease in urine output.  Your urine turns a dark color or changes to pink, red, or brown. Any of the following symptoms occur over the next 10 days:  You have a temperature by mouth above 102 F (38.9 C), not controlled by medicine.  Shortness of breath.  Weakness after normal activity.  The white part of the eye turns yellow (jaundice).  You have a decrease in the amount of urine or are urinating less often.  Your urine turns a dark color or changes to pink, red, or brown. Document Released: 05/10/2000 Document Revised: 08/05/2011 Document Reviewed: 12/28/2007 ExitCare Patient Information 2014 [place].  _______________________________________________________________________  Incentive Spirometer  An incentive spirometer is a tool that can help keep your lungs clear and active. This tool measures how well you are filling your lungs with each breath. Taking long deep  breaths may help reverse or decrease the chance of developing breathing (pulmonary) problems (especially infection) following:  A long period of time when you are unable to move or be active. BEFORE THE PROCEDURE   If the spirometer includes an indicator to show your best effort, your nurse or respiratory therapist will set it to a desired goal.  If possible, sit up straight or lean slightly forward. Try not to slouch.  Hold the incentive spirometer in an upright position. INSTRUCTIONS FOR USE  3. Sit on the edge of your bed if possible, or sit up as far as you can in bed or on a chair. 4. Hold the incentive spirometer in an upright position. 5. Breathe out normally. 6. Place the mouthpiece in your mouth and seal your lips tightly around it. 7. Breathe in slowly and as deeply as possible, raising the piston or the ball toward the top of the column. 8. Hold your breath for 3-5 seconds or for as long as possible. Allow the piston or ball to fall to the bottom of the column. 9. Remove the mouthpiece from your mouth and breathe out normally. 10. Rest for a few seconds and repeat Steps 1 through 7 at least 10 times every 1-2 hours when you are awake. Take your time and take a few normal breaths between deep breaths. 11. The spirometer may include an indicator to show your best effort. Use the indicator as a goal to work  toward during each repetition. 12. After each set of 10 deep breaths, practice coughing to be sure your lungs are clear. If you have an incision (the cut made at the time of surgery), support your incision when coughing by placing a pillow or rolled up towels firmly against it. Once you are able to get out of bed, walk around indoors and cough well. You may stop using the incentive spirometer when instructed by your caregiver.  RISKS AND COMPLICATIONS  Take your time so you do not get dizzy or light-headed.  If you are in pain, you may need to take or ask for pain medication  before doing incentive spirometry. It is harder to take a deep breath if you are having pain. AFTER USE  Rest and breathe slowly and easily.  It can be helpful to keep track of a log of your progress. Your caregiver can provide you with a simple table to help with this. If you are using the spirometer at home, follow these instructions: [place] IF:   You are having difficultly using the spirometer.  You have trouble using the spirometer as often as instructed.  Your pain medication is not giving enough relief while using the spirometer.  You develop fever of 100.5 F (38.1 C) or higher. SEEK IMMEDIATE MEDICAL CARE IF:   You cough up bloody sputum that had not been present before.  You develop fever of 102 F (38.9 C) or greater.  You develop worsening pain at or near the incision site. MAKE SURE YOU:   Understand these instructions.  Will watch your condition.  Will get help right away if you are not doing well or get worse. Document Released: 09/23/2006 Document Revised: 08/05/2011 Document Reviewed: 11/24/2006 [facility] Patient Information 2014 [place], [place].   ________________________________________________________________________

## 2015-01-09 ENCOUNTER — Encounter (HOSPITAL_COMMUNITY)
Admission: RE | Admit: 2015-01-09 | Discharge: 2015-01-09 | Disposition: A | Discharge status: Home / Self Care | Payer: Medicare Other | Source: Ambulatory Visit | Attending: Orthopedic Surgery | Admitting: Orthopedic Surgery

## 2015-01-09 ENCOUNTER — Encounter (HOSPITAL_COMMUNITY): Payer: Self-pay

## 2015-01-09 DIAGNOSIS — M179 Osteoarthritis of knee, unspecified: Secondary | ICD-10-CM | POA: Diagnosis not present

## 2015-01-09 DIAGNOSIS — Z7901 Long term (current) use of anticoagulants: Secondary | ICD-10-CM | POA: Insufficient documentation

## 2015-01-09 DIAGNOSIS — Z01818 Encounter for other preprocedural examination: Secondary | ICD-10-CM | POA: Diagnosis not present

## 2015-01-09 HISTORY — DX: Personal history of other diseases of the digestive system: Z87.19

## 2015-01-09 LAB — BASIC METABOLIC PANEL
Anion gap: 6 (ref 5–15)
BUN: 11 mg/dL (ref 6–20)
CALCIUM: 9 mg/dL (ref 8.9–10.3)
CO2: 31 mmol/L (ref 22–32)
Chloride: 103 mmol/L (ref 101–111)
Creatinine, Ser: 0.95 mg/dL (ref 0.61–1.24)
GFR calc Af Amer: >60 mL/min (ref >60)
GLUCOSE: 128 mg/dL — AB (ref 65–99)
Potassium: 3.9 mmol/L (ref 3.5–5.1)
Sodium: 140 mmol/L (ref 135–145)

## 2015-01-09 LAB — URINALYSIS, ROUTINE W REFLEX MICROSCOPIC
GLUCOSE, UA: NEGATIVE mg/dL
Hgb urine dipstick: NEGATIVE
Ketones, ur: NEGATIVE mg/dL
Leukocytes, UA: NEGATIVE
Nitrite: NEGATIVE
PH: 6 (ref 5.0–8.0)
PROTEIN: 30 mg/dL — AB
Specific Gravity, Urine: 1.03 (ref 1.005–1.030)
Urobilinogen, UA: 1 mg/dL (ref 0.0–1.0)

## 2015-01-09 LAB — CBC
HCT: 44.3 % (ref 39.0–52.0)
Hemoglobin: 14.5 g/dL (ref 13.0–17.0)
MCH: 29.7 pg (ref 26.0–34.0)
MCHC: 32.7 g/dL (ref 30.0–36.0)
MCV: 90.8 fL (ref 78.0–100.0)
Platelets: 122 10*3/uL — ABNORMAL LOW (ref 150–400)
RBC: 4.88 MIL/uL (ref 4.22–5.81)
RDW: 13.5 % (ref 11.5–15.5)
WBC: 8.4 10*3/uL (ref 4.0–10.5)

## 2015-01-09 LAB — PROTIME-INR
INR: 1.85 — AB (ref 0.00–1.49)
PROTHROMBIN TIME: 21.3 s — AB (ref 11.6–15.2)

## 2015-01-09 LAB — URINE MICROSCOPIC-ADD ON

## 2015-01-09 LAB — APTT: APTT: 42 s — AB (ref 24–37)

## 2015-01-09 LAB — SURGICAL PCR SCREEN
MRSA, PCR: NEGATIVE
Staphylococcus aureus: NEGATIVE

## 2015-01-09 NOTE — Progress Notes (Addendum)
12-09-14 - L[facility] - Dr. Percival Spanish - )cardio) -surgical clearance- EPIC and in chart 12-09-14 - EKG - EPIC 11-01-14 - Dr. Laurance Flatten - surgical clearance - in chart 11-01-14 LOV - Dr. Laurance Flatten - EPIC  03/2013 - Stress Test - EPIC 2014 - echo - EPIC

## 2015-01-09 NOTE — Progress Notes (Signed)
01-09-15 - CBC, PT, PTT, UA/Micro  Lab results from preop visit on 01-09-15 faxed to Dr. Alvan Dame via [facility]

## 2015-01-11 DIAGNOSIS — M9903 Segmental and somatic dysfunction of lumbar region: Secondary | ICD-10-CM | POA: Diagnosis not present

## 2015-01-11 DIAGNOSIS — M25562 Pain in left knee: Secondary | ICD-10-CM | POA: Diagnosis not present

## 2015-01-11 DIAGNOSIS — M9902 Segmental and somatic dysfunction of thoracic region: Secondary | ICD-10-CM | POA: Diagnosis not present

## 2015-01-11 DIAGNOSIS — M5137 Other intervertebral disc degeneration, lumbosacral region: Secondary | ICD-10-CM | POA: Diagnosis not present

## 2015-01-11 DIAGNOSIS — M9901 Segmental and somatic dysfunction of cervical region: Secondary | ICD-10-CM | POA: Diagnosis not present

## 2015-01-16 ENCOUNTER — Encounter (HOSPITAL_COMMUNITY): Payer: Self-pay

## 2015-01-16 ENCOUNTER — Inpatient Hospital Stay (HOSPITAL_COMMUNITY)
Admission: RE | Admit: 2015-01-16 | Discharge: 2015-01-20 | DRG: 470 | Disposition: A | Discharge status: Home Health | Payer: Medicare Other | Source: Ambulatory Visit | Attending: Orthopedic Surgery | Admitting: Orthopedic Surgery

## 2015-01-16 ENCOUNTER — Inpatient Hospital Stay (HOSPITAL_COMMUNITY): Payer: Medicare Other | Admitting: Anesthesiology

## 2015-01-16 ENCOUNTER — Encounter (HOSPITAL_COMMUNITY)
Admission: RE | Disposition: A | Discharge status: Home Health | Payer: Self-pay | Source: Ambulatory Visit | Attending: Orthopedic Surgery

## 2015-01-16 DIAGNOSIS — M1712 Unilateral primary osteoarthritis, left knee: Secondary | ICD-10-CM | POA: Diagnosis not present

## 2015-01-16 DIAGNOSIS — K449 Diaphragmatic hernia without obstruction or gangrene: Secondary | ICD-10-CM | POA: Diagnosis present

## 2015-01-16 DIAGNOSIS — F0390 Unspecified dementia without behavioral disturbance: Secondary | ICD-10-CM | POA: Diagnosis present

## 2015-01-16 DIAGNOSIS — M109 Gout, unspecified: Secondary | ICD-10-CM | POA: Diagnosis present

## 2015-01-16 DIAGNOSIS — K219 Gastro-esophageal reflux disease without esophagitis: Secondary | ICD-10-CM | POA: Diagnosis present

## 2015-01-16 DIAGNOSIS — E785 Hyperlipidemia, unspecified: Secondary | ICD-10-CM | POA: Diagnosis present

## 2015-01-16 DIAGNOSIS — Z880 Allergy status to penicillin: Secondary | ICD-10-CM

## 2015-01-16 DIAGNOSIS — N400 Enlarged prostate without lower urinary tract symptoms: Secondary | ICD-10-CM | POA: Diagnosis present

## 2015-01-16 DIAGNOSIS — G8929 Other chronic pain: Secondary | ICD-10-CM | POA: Diagnosis present

## 2015-01-16 DIAGNOSIS — R410 Disorientation, unspecified: Secondary | ICD-10-CM | POA: Diagnosis not present

## 2015-01-16 DIAGNOSIS — Z96651 Presence of right artificial knee joint: Secondary | ICD-10-CM | POA: Diagnosis present

## 2015-01-16 DIAGNOSIS — Z7901 Long term (current) use of anticoagulants: Secondary | ICD-10-CM | POA: Diagnosis not present

## 2015-01-16 DIAGNOSIS — I481 Persistent atrial fibrillation: Secondary | ICD-10-CM | POA: Diagnosis present

## 2015-01-16 DIAGNOSIS — M545 Low back pain: Secondary | ICD-10-CM | POA: Diagnosis present

## 2015-01-16 DIAGNOSIS — I10 Essential (primary) hypertension: Secondary | ICD-10-CM | POA: Diagnosis present

## 2015-01-16 DIAGNOSIS — M659 Synovitis and tenosynovitis, unspecified: Secondary | ICD-10-CM | POA: Diagnosis present

## 2015-01-16 DIAGNOSIS — Z96659 Presence of unspecified artificial knee joint: Secondary | ICD-10-CM

## 2015-01-16 DIAGNOSIS — Z87891 Personal history of nicotine dependence: Secondary | ICD-10-CM | POA: Diagnosis not present

## 2015-01-16 DIAGNOSIS — Z96652 Presence of left artificial knee joint: Secondary | ICD-10-CM

## 2015-01-16 DIAGNOSIS — E669 Obesity, unspecified: Secondary | ICD-10-CM | POA: Diagnosis present

## 2015-01-16 DIAGNOSIS — M179 Osteoarthritis of knee, unspecified: Secondary | ICD-10-CM | POA: Diagnosis not present

## 2015-01-16 DIAGNOSIS — E8881 Metabolic syndrome: Secondary | ICD-10-CM | POA: Diagnosis present

## 2015-01-16 DIAGNOSIS — M25462 Effusion, left knee: Secondary | ICD-10-CM | POA: Diagnosis present

## 2015-01-16 DIAGNOSIS — Z6832 Body mass index (BMI) 32.0-32.9, adult: Secondary | ICD-10-CM

## 2015-01-16 HISTORY — PX: TOTAL KNEE ARTHROPLASTY: SHX125

## 2015-01-16 LAB — TYPE AND SCREEN
ABO/RH(D): B NEG
Antibody Screen: NEGATIVE

## 2015-01-16 LAB — PROTIME-INR
INR: 1.12 (ref 0.00–1.49)
PROTHROMBIN TIME: 14.5 s (ref 11.6–15.2)

## 2015-01-16 LAB — APTT: APTT: 29 s (ref 24–37)

## 2015-01-16 SURGERY — ARTHROPLASTY, KNEE, TOTAL
Anesthesia: Monitor Anesthesia Care | Site: Knee | Laterality: Left

## 2015-01-16 MED ORDER — OXYCODONE HCL 5 MG/5ML PO SOLN
5.0000 mg | Freq: Once | ORAL | Status: DC | PRN
  Filled 2015-01-16: qty 5

## 2015-01-16 MED ORDER — DEXAMETHASONE SODIUM PHOSPHATE 10 MG/ML IJ SOLN
INTRAMUSCULAR | Status: AC
  Filled 2015-01-16: qty 1

## 2015-01-16 MED ORDER — DEXAMETHASONE SODIUM PHOSPHATE 10 MG/ML IJ SOLN
10.0000 mg | Freq: Once | INTRAMUSCULAR | Status: AC
  Administered 2015-01-16: 10 mg via INTRAVENOUS

## 2015-01-16 MED ORDER — BUPIVACAINE-EPINEPHRINE (PF) 0.25% -1:200000 IJ SOLN
INTRAMUSCULAR | Status: AC
  Filled 2015-01-16: qty 30

## 2015-01-16 MED ORDER — OXYCODONE HCL 5 MG PO TABS: 5.0000 mg | ORAL_TABLET | Freq: Once | ORAL | Status: DC | PRN

## 2015-01-16 MED ORDER — HYDROCODONE-ACETAMINOPHEN 7.5-325 MG PO TABS
1.0000 | ORAL_TABLET | ORAL | Status: DC
  Administered 2015-01-16 – 2015-01-17 (×5): 1 via ORAL
  Administered 2015-01-18: 2 via ORAL
  Filled 2015-01-16 (×3): qty 2
  Filled 2015-01-16: qty 1
  Filled 2015-01-16: qty 2
  Filled 2015-01-16 (×4): qty 1

## 2015-01-16 MED ORDER — ONDANSETRON HCL 4 MG PO TABS: 4.0000 mg | ORAL_TABLET | Freq: Four times a day (QID) | ORAL | Status: DC | PRN

## 2015-01-16 MED ORDER — CLINDAMYCIN PHOSPHATE 900 MG/50ML IV SOLN
900.0000 mg | Freq: Once | INTRAVENOUS | Status: AC
  Administered 2015-01-16: 900 mg via INTRAVENOUS
  Filled 2015-01-16: qty 50

## 2015-01-16 MED ORDER — DEXTROSE 5 % IV SOLN
500.0000 mg | Freq: Four times a day (QID) | INTRAVENOUS | Status: DC | PRN
  Administered 2015-01-17: 500 mg via INTRAVENOUS
  Filled 2015-01-16 (×2): qty 5

## 2015-01-16 MED ORDER — KETOROLAC TROMETHAMINE 30 MG/ML IJ SOLN
INTRAMUSCULAR | Status: AC
  Filled 2015-01-16: qty 1

## 2015-01-16 MED ORDER — POLYETHYLENE GLYCOL 3350 17 G PO PACK
17.0000 g | PACK | Freq: Two times a day (BID) | ORAL | Status: DC
  Administered 2015-01-16 – 2015-01-19 (×5): 17 g via ORAL

## 2015-01-16 MED ORDER — KETOROLAC TROMETHAMINE 30 MG/ML IJ SOLN
INTRAMUSCULAR | Status: DC | PRN
  Administered 2015-01-16: 30 mg via INTRAVENOUS

## 2015-01-16 MED ORDER — PHENOL 1.4 % MT LIQD
1.0000 | OROMUCOSAL | Status: DC | PRN
  Filled 2015-01-16: qty 177

## 2015-01-16 MED ORDER — PROPOFOL 10 MG/ML IV BOLUS
INTRAVENOUS | Status: AC
  Filled 2015-01-16: qty 20

## 2015-01-16 MED ORDER — CEFAZOLIN SODIUM-DEXTROSE 2-3 GM-% IV SOLR: 2.0000 g | INTRAVENOUS | Status: DC

## 2015-01-16 MED ORDER — PHENYLEPHRINE 40 MCG/ML (10ML) SYRINGE FOR IV PUSH (FOR BLOOD PRESSURE SUPPORT)
PREFILLED_SYRINGE | INTRAVENOUS | Status: AC
  Filled 2015-01-16: qty 10

## 2015-01-16 MED ORDER — NIACIN ER (ANTIHYPERLIPIDEMIC) 500 MG PO TBCR
1000.0000 mg | EXTENDED_RELEASE_TABLET | Freq: Every day | ORAL | Status: DC
  Administered 2015-01-16 – 2015-01-19 (×4): 1000 mg via ORAL
  Filled 2015-01-16 (×5): qty 2

## 2015-01-16 MED ORDER — ATORVASTATIN CALCIUM 40 MG PO TABS
40.0000 mg | ORAL_TABLET | Freq: Every day | ORAL | Status: DC
  Administered 2015-01-16 – 2015-01-19 (×4): 40 mg via ORAL
  Filled 2015-01-16 (×5): qty 1

## 2015-01-16 MED ORDER — BISACODYL 10 MG RE SUPP: 10.0000 mg | Freq: Every day | RECTAL | Status: DC | PRN

## 2015-01-16 MED ORDER — ONDANSETRON HCL 4 MG/2ML IJ SOLN: 4.0000 mg | Freq: Four times a day (QID) | INTRAMUSCULAR | Status: DC | PRN

## 2015-01-16 MED ORDER — DOCUSATE SODIUM 100 MG PO CAPS
100.0000 mg | ORAL_CAPSULE | Freq: Two times a day (BID) | ORAL | Status: DC
  Administered 2015-01-16 – 2015-01-19 (×6): 100 mg via ORAL

## 2015-01-16 MED ORDER — SODIUM CHLORIDE 0.9 % IV SOLN
INTRAVENOUS | Status: DC
  Administered 2015-01-16 – 2015-01-17 (×2): via INTRAVENOUS
  Filled 2015-01-16 (×12): qty 1000

## 2015-01-16 MED ORDER — CLINDAMYCIN PHOSPHATE 600 MG/50ML IV SOLN
600.0000 mg | Freq: Four times a day (QID) | INTRAVENOUS | Status: AC
  Administered 2015-01-16 (×2): 600 mg via INTRAVENOUS
  Filled 2015-01-16 (×2): qty 50

## 2015-01-16 MED ORDER — SODIUM CHLORIDE 0.9 % IJ SOLN
INTRAMUSCULAR | Status: DC | PRN
  Administered 2015-01-16: 50 mL via INTRAVENOUS

## 2015-01-16 MED ORDER — PHENYLEPHRINE HCL 10 MG/ML IJ SOLN
INTRAMUSCULAR | Status: DC | PRN
  Administered 2015-01-16 (×5): 80 ug via INTRAVENOUS

## 2015-01-16 MED ORDER — LACTATED RINGERS IV SOLN
INTRAVENOUS | Status: DC
  Administered 2015-01-16: 11:00:00 via INTRAVENOUS
  Administered 2015-01-16: 1000 mL via INTRAVENOUS

## 2015-01-16 MED ORDER — FENTANYL CITRATE (PF) 250 MCG/5ML IJ SOLN
INTRAMUSCULAR | Status: DC | PRN
  Administered 2015-01-16: 25 ug via INTRAVENOUS

## 2015-01-16 MED ORDER — DEXAMETHASONE SODIUM PHOSPHATE 10 MG/ML IJ SOLN
10.0000 mg | Freq: Once | INTRAMUSCULAR | Status: AC
  Administered 2015-01-17: 10 mg via INTRAVENOUS
  Filled 2015-01-16: qty 1

## 2015-01-16 MED ORDER — PROPOFOL INFUSION 10 MG/ML OPTIME
INTRAVENOUS | Status: DC | PRN
  Administered 2015-01-16: 75 ug/kg/min via INTRAVENOUS

## 2015-01-16 MED ORDER — FENTANYL CITRATE (PF) 100 MCG/2ML IJ SOLN: 25.0000 ug | INTRAMUSCULAR | Status: DC | PRN

## 2015-01-16 MED ORDER — METOCLOPRAMIDE HCL 10 MG PO TABS: 5.0000 mg | ORAL_TABLET | Freq: Three times a day (TID) | ORAL | Status: DC | PRN

## 2015-01-16 MED ORDER — METOCLOPRAMIDE HCL 5 MG/ML IJ SOLN: 5.0000 mg | Freq: Three times a day (TID) | INTRAMUSCULAR | Status: DC | PRN

## 2015-01-16 MED ORDER — ONDANSETRON HCL 4 MG/2ML IJ SOLN
INTRAMUSCULAR | Status: DC | PRN
  Administered 2015-01-16: 4 mg via INTRAVENOUS

## 2015-01-16 MED ORDER — METHOCARBAMOL 500 MG PO TABS
500.0000 mg | ORAL_TABLET | Freq: Four times a day (QID) | ORAL | Status: DC | PRN
  Administered 2015-01-17 – 2015-01-19 (×3): 500 mg via ORAL
  Filled 2015-01-16 (×3): qty 1

## 2015-01-16 MED ORDER — FENTANYL CITRATE (PF) 100 MCG/2ML IJ SOLN
INTRAMUSCULAR | Status: AC
  Filled 2015-01-16: qty 2

## 2015-01-16 MED ORDER — TRANEXAMIC ACID 1000 MG/10ML IV SOLN
1000.0000 mg | Freq: Once | INTRAVENOUS | Status: AC
  Administered 2015-01-16: 1000 mg via INTRAVENOUS
  Filled 2015-01-16: qty 10

## 2015-01-16 MED ORDER — NON FORMULARY: 40.0000 mg | Freq: Every day | Status: DC

## 2015-01-16 MED ORDER — PROPOFOL 10 MG/ML IV BOLUS
INTRAVENOUS | Status: AC
Start: 2015-01-16 — End: 2015-01-16
  Filled 2015-01-16: qty 20

## 2015-01-16 MED ORDER — MENTHOL 3 MG MT LOZG: 1.0000 | LOZENGE | OROMUCOSAL | Status: DC | PRN

## 2015-01-16 MED ORDER — BUPIVACAINE-EPINEPHRINE (PF) 0.25% -1:200000 IJ SOLN
INTRAMUSCULAR | Status: DC | PRN
  Administered 2015-01-16: 30 mL via PERINEURAL

## 2015-01-16 MED ORDER — DIPHENHYDRAMINE HCL 25 MG PO CAPS: 25.0000 mg | ORAL_CAPSULE | Freq: Four times a day (QID) | ORAL | Status: DC | PRN

## 2015-01-16 MED ORDER — SODIUM CHLORIDE 0.9 % IJ SOLN
INTRAMUSCULAR | Status: AC
  Filled 2015-01-16: qty 50

## 2015-01-16 MED ORDER — HYDROMORPHONE HCL 1 MG/ML IJ SOLN
0.5000 mg | INTRAMUSCULAR | Status: DC | PRN
  Administered 2015-01-16 – 2015-01-17 (×2): 0.5 mg via INTRAVENOUS
  Filled 2015-01-16 (×2): qty 1

## 2015-01-16 MED ORDER — FERROUS SULFATE 325 (65 FE) MG PO TABS
325.0000 mg | ORAL_TABLET | Freq: Three times a day (TID) | ORAL | Status: DC
  Administered 2015-01-16 – 2015-01-20 (×10): 325 mg via ORAL
  Filled 2015-01-16 (×15): qty 1

## 2015-01-16 MED ORDER — ONDANSETRON HCL 4 MG/2ML IJ SOLN
INTRAMUSCULAR | Status: AC
  Filled 2015-01-16: qty 2

## 2015-01-16 MED ORDER — RIVAROXABAN 10 MG PO TABS
10.0000 mg | ORAL_TABLET | ORAL | Status: DC
  Administered 2015-01-17 – 2015-01-19 (×3): 10 mg via ORAL
  Filled 2015-01-16 (×5): qty 1

## 2015-01-16 MED ORDER — BUPIVACAINE IN DEXTROSE 0.75-8.25 % IT SOLN
INTRATHECAL | Status: DC | PRN
  Administered 2015-01-16: 1.8 mL via INTRATHECAL

## 2015-01-16 MED ORDER — ESOMEPRAZOLE MAGNESIUM 40 MG PO CPDR
40.0000 mg | DELAYED_RELEASE_CAPSULE | Freq: Every day | ORAL | Status: AC
  Administered 2015-01-17 – 2015-01-18 (×2): 40 mg via ORAL
  Filled 2015-01-16 (×3): qty 1

## 2015-01-16 MED ORDER — CLINDAMYCIN PHOSPHATE 900 MG/50ML IV SOLN
INTRAVENOUS | Status: AC
  Filled 2015-01-16: qty 50

## 2015-01-16 MED ORDER — MAGNESIUM CITRATE PO SOLN: 1.0000 | Freq: Once | ORAL | Status: DC | PRN

## 2015-01-16 MED ORDER — PROPOFOL 10 MG/ML IV BOLUS
INTRAVENOUS | Status: DC | PRN
  Administered 2015-01-16 (×2): 20 mg via INTRAVENOUS

## 2015-01-16 MED ORDER — CHLORHEXIDINE GLUCONATE 4 % EX LIQD: 60.0000 mL | Freq: Once | CUTANEOUS | Status: DC

## 2015-01-16 MED ORDER — ATENOLOL 25 MG PO TABS
25.0000 mg | ORAL_TABLET | Freq: Two times a day (BID) | ORAL | Status: DC
  Administered 2015-01-16 – 2015-01-20 (×4): 25 mg via ORAL
  Filled 2015-01-16 (×9): qty 1

## 2015-01-16 MED ORDER — TAMSULOSIN HCL 0.4 MG PO CAPS
0.4000 mg | ORAL_CAPSULE | Freq: Every day | ORAL | Status: DC
  Administered 2015-01-16 – 2015-01-19 (×4): 0.4 mg via ORAL
  Filled 2015-01-16 (×5): qty 1

## 2015-01-16 MED ORDER — ALLOPURINOL 300 MG PO TABS
300.0000 mg | ORAL_TABLET | Freq: Every day | ORAL | Status: DC
  Administered 2015-01-17 – 2015-01-20 (×4): 300 mg via ORAL
  Filled 2015-01-16 (×4): qty 1

## 2015-01-16 MED ORDER — CELECOXIB 200 MG PO CAPS
200.0000 mg | ORAL_CAPSULE | Freq: Two times a day (BID) | ORAL | Status: DC
  Administered 2015-01-16 – 2015-01-18 (×5): 200 mg via ORAL
  Filled 2015-01-16 (×6): qty 1

## 2015-01-16 MED ORDER — ALUM & MAG HYDROXIDE-SIMETH 200-200-20 MG/5ML PO SUSP: 30.0000 mL | ORAL | Status: DC | PRN

## 2015-01-16 SURGICAL SUPPLY — 56 items
BAG DECANTER FOR FLEXI CONT (MISCELLANEOUS) IMPLANT
BAG SPEC THK2 15X12 ZIP CLS (MISCELLANEOUS)
BAG ZIPLOCK 12X15 (MISCELLANEOUS) IMPLANT
BANDAGE ELASTIC 6 VELCRO ST LF (GAUZE/BANDAGES/DRESSINGS) ×3 IMPLANT
BANDAGE ESMARK 6X9 LF (GAUZE/BANDAGES/DRESSINGS) ×1 IMPLANT
BLADE SAW SGTL 13.0X1.19X90.0M (BLADE) ×3 IMPLANT
BNDG CMPR 9X6 STRL LF SNTH (GAUZE/BANDAGES/DRESSINGS) ×1
BNDG ESMARK 6X9 LF (GAUZE/BANDAGES/DRESSINGS) ×3
BOWL SMART MIX CTS (DISPOSABLE) ×3 IMPLANT
CAP KNEE TOTAL 3 SIGMA ×2 IMPLANT
CEMENT HV SMART SET (Cement) ×4 IMPLANT
CUFF TOURN SGL QUICK 34 (TOURNIQUET CUFF) ×3
CUFF TRNQT CYL 34X4X40X1 (TOURNIQUET CUFF) ×1 IMPLANT
DECANTER SPIKE VIAL GLASS SM (MISCELLANEOUS) ×3 IMPLANT
DRAPE EXTREMITY T 121X128X90 (DRAPE) ×3 IMPLANT
DRAPE POUCH INSTRU U-SHP 10X18 (DRAPES) ×3 IMPLANT
DRAPE U-SHAPE 47X51 STRL (DRAPES) ×3 IMPLANT
DRSG AQUACEL AG ADV 3.5X10 (GAUZE/BANDAGES/DRESSINGS) ×3 IMPLANT
DURAPREP 26ML APPLICATOR (WOUND CARE) ×6 IMPLANT
ELECT REM PT RETURN 9FT ADLT (ELECTROSURGICAL) ×3
ELECTRODE REM PT RTRN 9FT ADLT (ELECTROSURGICAL) ×1 IMPLANT
FACESHIELD WRAPAROUND (MASK) ×15 IMPLANT
FACESHIELD WRAPAROUND OR TEAM (MASK) ×5 IMPLANT
GLOVE BIOGEL PI IND STRL 7.5 (GLOVE) ×1 IMPLANT
GLOVE BIOGEL PI IND STRL 8.5 (GLOVE) ×1 IMPLANT
GLOVE BIOGEL PI INDICATOR 7.5 (GLOVE) ×2
GLOVE BIOGEL PI INDICATOR 8.5 (GLOVE) ×2
GLOVE ECLIPSE 8.0 STRL XLNG CF (GLOVE) ×3 IMPLANT
GLOVE ORTHO TXT STRL SZ7.5 (GLOVE) ×6 IMPLANT
GOWN SPEC L3 XXLG W/TWL (GOWN DISPOSABLE) ×3 IMPLANT
GOWN STRL REUS W/TWL LRG LVL3 (GOWN DISPOSABLE) ×3 IMPLANT
HANDPIECE INTERPULSE COAX TIP (DISPOSABLE) ×3
KIT BASIN OR (CUSTOM PROCEDURE TRAY) ×3 IMPLANT
LIQUID BAND (GAUZE/BANDAGES/DRESSINGS) ×3 IMPLANT
MANIFOLD NEPTUNE II (INSTRUMENTS) ×3 IMPLANT
NDL SAFETY ECLIPSE 18X1.5 (NEEDLE) ×1 IMPLANT
NEEDLE HYPO 18GX1.5 SHARP (NEEDLE) ×3
PACK TOTAL JOINT (CUSTOM PROCEDURE TRAY) ×3 IMPLANT
PEN SKIN MARKING BROAD (MISCELLANEOUS) ×3 IMPLANT
POSITIONER SURGICAL ARM (MISCELLANEOUS) ×3 IMPLANT
SET HNDPC FAN SPRY TIP SCT (DISPOSABLE) ×1 IMPLANT
SET PAD KNEE POSITIONER (MISCELLANEOUS) ×3 IMPLANT
SUCTION FRAZIER 12FR DISP (SUCTIONS) ×3 IMPLANT
SUT MNCRL AB 4-0 PS2 18 (SUTURE) ×3 IMPLANT
SUT VIC AB 1 CT1 36 (SUTURE) ×3 IMPLANT
SUT VIC AB 2-0 CT1 27 (SUTURE) ×9
SUT VIC AB 2-0 CT1 TAPERPNT 27 (SUTURE) ×3 IMPLANT
SUT VLOC 180 0 24IN GS25 (SUTURE) ×3 IMPLANT
SYR 50ML LL SCALE MARK (SYRINGE) ×3 IMPLANT
TOWEL OR 17X26 10 PK STRL BLUE (TOWEL DISPOSABLE) ×3 IMPLANT
TOWEL OR NON WOVEN STRL DISP B (DISPOSABLE) IMPLANT
TRAY FOLEY W/METER SILVER 14FR (SET/KITS/TRAYS/PACK) ×3 IMPLANT
TRAY FOLEY W/METER SILVER 16FR (SET/KITS/TRAYS/PACK) ×3 IMPLANT
WATER STERILE IRR 1500ML POUR (IV SOLUTION) ×3 IMPLANT
WRAP KNEE MAXI GEL POST OP (GAUZE/BANDAGES/DRESSINGS) ×3 IMPLANT
YANKAUER SUCT BULB TIP 10FT TU (MISCELLANEOUS) ×3 IMPLANT

## 2015-01-16 NOTE — Anesthesia Postprocedure Evaluation (Signed)
  Anesthesia Post-op Note  Patient: Mark Wilkinson  Procedure(s) Performed: Procedure(s): TOTAL LEFT KNEE ARTHROPLASTY (Left)  Patient Location: PACU  Anesthesia Type:Spinal  Level of Consciousness: awake, alert  and oriented  Airway and Oxygen Therapy: Patient Spontanous Breathing  Post-op Pain: none  Post-op Assessment: Post-op Vital signs reviewed, Patient's Cardiovascular Status Stable and Respiratory Function Stable LLE Motor Response: Purposeful movement LLE Sensation: No sensation (absent), Tingling RLE Motor Response: Purposeful movement RLE Sensation: Tingling L Sensory Level: S1-Sole of foot, small toes R Sensory Level: S1-Sole of foot, small toes  Post-op Vital Signs: Reviewed and stable  Last Vitals:  Filed Vitals:   01/16/15 1632  BP: 145/82  Pulse: 77  Temp: 36.8 C  Resp: 16    Complications: No apparent anesthesia complications

## 2015-01-16 NOTE — Plan of Care (Signed)
Problem: Phase I Progression Outcomes Goal: CMS/Neurovascular status WDL Outcome: Completed/Met Date Met:  01/16/15 Pt has history of Dementia. Will need frequent reorientation.  Goal: Initial discharge plan identified Outcome: Completed/Met Date Met:  01/16/15 Pt states that he plans to go home with his wife at discharge.

## 2015-01-16 NOTE — Interval H&P Note (Signed)
History and Physical Interval Note:  01/16/2015 9:04 AM  Mark Wilkinson  has presented today for surgery, with the diagnosis of LEFT KNEE OA   The various methods of treatment have been discussed with the patient and family. After consideration of risks, benefits and other options for treatment, the patient has consented to  Procedure(s): TOTAL LEFT KNEE ARTHROPLASTY (Left) as a surgical intervention .  The patient's history has been reviewed, patient examined, no change in status, stable for surgery.  I have reviewed the patient's chart and labs.  Questions were answered to the patient's satisfaction.     [name]

## 2015-01-16 NOTE — Anesthesia Procedure Notes (Signed)
Spinal Patient location during procedure: OR Start time: 01/16/2015 10:01 AM End time: 01/16/2015 10:10 AM Staffing Anesthesiologist: Ashea Winiarski Performed by: anesthesiologist  Preanesthetic Checklist Completed: patient identified, site marked, surgical consent, pre-op evaluation, timeout performed, IV checked, risks and benefits discussed and monitors and equipment checked Spinal Block Patient position: sitting Prep: Betadine Patient monitoring: heart rate, cardiac monitor, continuous pulse ox and blood pressure Approach: midline Location: L3-4 Injection technique: single-shot Needle Needle type: Spinocan  Needle gauge: 22 G Needle length: 10 cm Assessment Sensory level: T6 Additional Notes Pt tolerated the procedure well.

## 2015-01-16 NOTE — Transfer of Care (Signed)
Immediate Anesthesia Transfer of Care Note  Patient: Mark Wilkinson  Procedure(s) Performed: Procedure(s): TOTAL LEFT KNEE ARTHROPLASTY (Left)  Patient Location: PACU  Anesthesia Type:MAC and Spinal  Level of Consciousness: awake and patient cooperative  Airway & Oxygen Therapy: Patient Spontanous Breathing and Patient connected to face mask oxygen  Post-op Assessment: Report given to RN and Post -op Vital signs reviewed and stable  Post vital signs: Reviewed and stable  Last Vitals:  Filed Vitals:   01/16/15 0731  BP: 111/80  Pulse: 82  Temp: 36.9 C  Resp: 18    Complications: No apparent anesthesia complications

## 2015-01-16 NOTE — Anesthesia Preprocedure Evaluation (Addendum)
Anesthesia Evaluation  Patient identified by MRN, date of birth, ID band Patient awake    Reviewed: Allergy & Precautions, NPO status , Patient's Chart, lab work & pertinent test results  History of Anesthesia Complications (+) PONV  Airway Mallampati: II   Neck ROM: full    Dental   Pulmonary former smoker,  breath sounds clear to auscultation        Cardiovascular hypertension, + dysrhythmias Atrial Fibrillation Rhythm:irregular Rate:Normal     Neuro/Psych  Neuromuscular disease    GI/Hepatic hiatal hernia, GERD-  ,  Endo/Other  obese  Renal/GU      Musculoskeletal  (+) Arthritis -,   Abdominal   Peds  Hematology   Anesthesia Other Findings   Reproductive/Obstetrics                            Anesthesia Physical Anesthesia Plan  ASA: III  Anesthesia Plan: MAC and Spinal   Post-op Pain Management:    Induction: Intravenous  Airway Management Planned: Simple Face Mask  Additional Equipment:   Intra-op Plan:   Post-operative Plan:   Informed Consent: I have reviewed the patients History and Physical, chart, labs and discussed the procedure including the risks, benefits and alternatives for the proposed anesthesia with the patient or authorized representative who has indicated his/her understanding and acceptance.     Plan Discussed with: CRNA, Anesthesiologist and Surgeon  Anesthesia Plan Comments:         Anesthesia Quick Evaluation

## 2015-01-16 NOTE — Progress Notes (Signed)
Utilization review completed.  

## 2015-01-16 NOTE — Progress Notes (Signed)
Pt family came to nurses station due to pt becoming agitated and attempting to get out of bed. Nurse and tech assessed situation and pt was attempting to get out of bed because he needed to use the bathroom. Educated pt that he has a foley catheter in place. Pt not understanding of this because of his Dementia history. Upon getting pt into chair, pt still trying to pull foley catheter out because he needed to use the bathroom. Obtained telephone order from [facility] [place] to take foley out to decreased chance of pt pulling catheter out and doing self arm. Foley catheter discontinued and urinal put in place for patient to use. Pt not as agitated at this time and is sitting in the chair with family at the bedside. Call light in place.

## 2015-01-16 NOTE — Progress Notes (Signed)
   01/16/15 1737  PT Visit Information  Last PT Received On 01/16/15 Rn asked to see after stating he was not ready earlier, family requesting, pt trying to get OOB  Reason Eval/Treat Not Completed Other (comment) (PT checked on pt--getting up with nsg)

## 2015-01-16 NOTE — Op Note (Signed)
NAME:  Mark Wilkinson                      MEDICAL RECORD NO.:  [id number]                             FACILITY:  [facility]      PHYSICIAN:  Pietro Cassis. Alvan Dame, M.D.  DATE OF BIRTH:  May 01, 1928      DATE OF PROCEDURE:  01/16/2015                                     OPERATIVE REPORT         PREOPERATIVE DIAGNOSIS:  Left knee osteoarthritis.      POSTOPERATIVE DIAGNOSIS:  Left knee osteoarthritis. History of right total knee repalcement     FINDINGS:  The patient was noted to have complete loss of cartilage and   bone-on-bone arthritis with associated osteophytes in the medial and patellofemoral compartments of   the knee with a significant synovitis and associated effusion.      PROCEDURE:  Left total knee replacement.      COMPONENTS USED:  [employer] rotating platform posterior stabilized knee   system, a size 5 femur, 4 tibia, 12.5 mm PS insert, and 41 patellar   button.      SURGEON:  Pietro Cassis. Alvan Dame, M.D.      ASSISTANT:  Danae Orleans, PA-C.      ANESTHESIA:  Spinal.      SPECIMENS:  None.      COMPLICATION:  None.      DRAINS:  None.  EBL: <50cc      TOURNIQUET TIME:  * Missing tourniquet times found for documented tourniquets in log:  [id number] * .      The patient was stable to the recovery room.      INDICATION FOR PROCEDURE:  Mark Wilkinson is a 81 y.o. male patient of   mine.  The patient had been seen, evaluated, and treated conservatively in the   office with medication, activity modification, and injections.  The patient had   radiographic changes of bone-on-bone arthritis with endplate sclerosis and osteophytes noted.      The patient failed conservative measures including medication, injections, and activity modification, and at this point was ready for more definitive measures.   Based on the radiographic changes and failed conservative measures, the patient   decided to proceed with total knee replacement.  Risks of infection,   DVT, component failure, need for  revision surgery, postop course, and   expectations were all   discussed and reviewed.  Consent was obtained for benefit of pain   relief.      PROCEDURE IN DETAIL:  The patient was brought to the operative theater.   Once adequate anesthesia, preoperative antibiotics, 600 mg of Cleocin, 1 gm of Tranexamic Acid, and 10 mg of Decadron administered, the patient was positioned supine with the left thigh tourniquet placed.  The  left lower extremity was prepped and draped in sterile fashion.  A time-   out was performed identifying the patient, planned procedure, and   extremity.      The left lower extremity was placed in the [facility] leg holder.  The leg was   exsanguinated, tourniquet elevated to 250 mmHg.  A midline incision was   made followed by  median parapatellar arthrotomy.  Following initial   exposure, attention was first directed to the patella.  Precut   measurement was noted to be 26 mm.  I resected down to 14-15 mm and used a   41 patellar button to restore patellar height as well as cover the cut   surface.      The lug holes were drilled and a metal shim was placed to protect the   patella from retractors and saw blades.      At this point, attention was now directed to the femur.  The femoral   canal was opened with a drill, irrigated to try to prevent fat emboli.  An   intramedullary rod was passed at 3 degrees valgus, 10 mm of bone was   resected off the distal femur.  Following this resection, the tibia was   subluxated anteriorly.  Using the extramedullary guide, 2 mm of bone was resected off   the proximal medial tibia.  We confirmed the gap would be   stable medially and laterally with a 10 mm insert as well as confirmed   the cut was perpendicular in the coronal plane, checking with an alignment rod.      Once this was done, I sized the femur to be a size 5 in the anterior-   posterior dimension, chose a standard component based on medial and   lateral dimension.   The size 5 rotation block was then pinned in   position anterior referenced using the C-clamp to set rotation.  The   anterior, posterior, and  chamfer cuts were made without difficulty nor   notching making certain that I was along the anterior cortex to help   with flexion gap stability.      The final box cut was made off the lateral aspect of distal femur.      At this point, the tibia was sized to be a size 4, the size 4 tray was   then pinned in position through the medial third of the tubercle,   drilled, and keel punched.  Trial reduction was now carried with a 5 femur,  4 tibia, a 10 then 12.5 mm PS insert, and the 41 patella botton.  The knee was brought to   extension, full extension with good flexion stability with the patella   tracking through the trochlea without application of pressure.  Given   all these findings, the trial components removed.  Final components were   opened and cement was mixed.  The knee was irrigated with normal saline   solution and pulse lavage.  The synovial lining was   then injected with 30cc of 0.25% Marcaine with epinephrine and 1 cc of Toradol plus 30cc of NS for a   total of 61 cc.      The knee was irrigated.  Final implants were then cemented onto clean and   dried cut surfaces of bone with the knee brought to extension with a 12.5 mm trial insert.      Once the cement had fully cured, the excess cement was removed   throughout the knee.  I confirmed I was satisfied with the range of   motion and stability, and the final 12.5 mm PS insert was chosen.  It was   placed into the knee.      The tourniquet had been let down at 29 minutes.  No significant   hemostasis required.  The   extensor mechanism was then  reapproximated using #1 Vicryl and # 0 V-lock sutures with the knee   in flexion.  The   remaining wound was closed with 2-0 Vicryl and running 4-0 Monocryl.   The knee was cleaned, dried, dressed sterilely using Dermabond and    Aquacel dressing.  The patient was then   brought to recovery room in stable condition, tolerating the procedure   well.   Please note that Physician Assistant, Danae Orleans, PA-C, was present for the entirety of the case, and was utilized for pre-operative positioning, peri-operative retractor management, general facilitation of the procedure.  He was also utilized for primary wound closure at the end of the case.              Pietro Cassis Alvan Dame, M.D.    01/16/2015 11:18 AM

## 2015-01-17 ENCOUNTER — Encounter (HOSPITAL_COMMUNITY): Payer: Self-pay | Admitting: Orthopedic Surgery

## 2015-01-17 LAB — CBC
HEMATOCRIT: 38.6 % — AB (ref 39.0–52.0)
HEMOGLOBIN: 12.9 g/dL — AB (ref 13.0–17.0)
MCH: 30.5 pg (ref 26.0–34.0)
MCHC: 33.4 g/dL (ref 30.0–36.0)
MCV: 91.3 fL (ref 78.0–100.0)
Platelets: 141 10*3/uL — ABNORMAL LOW (ref 150–400)
RBC: 4.23 MIL/uL (ref 4.22–5.81)
RDW: 13.5 % (ref 11.5–15.5)
WBC: 13.2 10*3/uL — AB (ref 4.0–10.5)

## 2015-01-17 LAB — BASIC METABOLIC PANEL
ANION GAP: 8 (ref 5–15)
BUN: 23 mg/dL — ABNORMAL HIGH (ref 6–20)
CHLORIDE: 104 mmol/L (ref 101–111)
CO2: 27 mmol/L (ref 22–32)
Calcium: 8.4 mg/dL — ABNORMAL LOW (ref 8.9–10.3)
Creatinine, Ser: 1.25 mg/dL — ABNORMAL HIGH (ref 0.61–1.24)
GFR calc non Af Amer: 51 mL/min — ABNORMAL LOW (ref >60)
GFR, EST AFRICAN AMERICAN: 59 mL/min — AB (ref >60)
Glucose, Bld: 134 mg/dL — ABNORMAL HIGH (ref 65–99)
Potassium: 4.6 mmol/L (ref 3.5–5.1)
Sodium: 139 mmol/L (ref 135–145)

## 2015-01-17 NOTE — Progress Notes (Signed)
Physical Therapy Treatment Patient Details Name: Mark Wilkinson MRN: [id number] DOB: 06-01-1927 Today's Date: 01/17/2015    History of Present Illness L TKA    PT Comments    Patient requires multimodal cues for safety, L leg position and use of RW safety. Concern for bbalance and safety if Wife is only caregiver as patient remains unsteady and requires frequent cues.  Follow Up Recommendations  Home health PT;Supervision/Assistance - 24 hour     Equipment Recommendations  None recommended by PT    Recommendations for Other Services       Precautions / Restrictions Precautions Precautions: Fall;Knee    Mobility  Bed Mobility Overal bed mobility: Needs Assistance Bed Mobility: Sit to Supine     Supine to sit: Min assist Sit to supine: Min assist   General bed mobility comments: support L lehg onto bed.  Transfers Overall transfer level: Needs assistance Equipment used: Rolling walker (2 wheeled) Transfers: Sit to/from Stand Sit to Stand: Mod assist         General transfer comment: cues for hand position and L leg prior to sitting. Multimodal cues for safey and all aspects of mobility.  Ambulation/Gait Ambulation/Gait assistance: Min assist;+2 safety/equipment Ambulation Distance (Feet): 45 Feet (x 2) Assistive device: Rolling walker (2 wheeled) Gait Pattern/deviations: Step-to pattern;Step-through pattern;Staggering left;Staggering right     General Gait Details: cues to stay within RW., cues for sequence, cues for safety,   Stairs            Wheelchair Mobility    Modified Rankin (Stroke Patients Only)       Balance Overall balance assessment: Needs assistance Sitting-balance support: Bilateral upper extremity supported;Feet supported Sitting balance-Leahy Scale: Fair     Standing balance support: During functional activity;Bilateral upper extremity supported Standing balance-Leahy Scale: Poor Standing balance comment: staggers at times,  steady assist.                    Cognition Arousal/Alertness: Awake/alert Behavior During Therapy: Impulsive Overall Cognitive Status: History of cognitive impairments - at baseline                      Exercises Total Joint Exercises Ankle Circles/Pumps: AROM;Both;10 reps;Supine Quad Sets: AROM;Both;10 reps;Supine Short Arc Quad: AROM;10 reps;Left;Supine Heel Slides: AROM;Left;10 reps;Supine Hip ABduction/ADduction: AROM;Left;10 reps;Supine Straight Leg Raises: AAROM;Left;10 reps;Supine    General Comments        Pertinent Vitals/Pain Pain Assessment: Faces Faces Pain Scale: Hurts even more Pain Location: L knee Pain Descriptors / Indicators: Aching;Sore Pain Intervention(s): Patient requesting pain meds-RN notified;Ice applied;Limited activity within patient's tolerance;Monitored during session    [place] expects to be discharged to:: Private residence Living Arrangements: Spouse/significant other Available Help at Discharge: Family Type of Home: House Home Access: Stairs to enter Entrance Stairs-Rails: Left Home Layout: One level [place]: Environmental consultant - 2 wheels;Bedside commode      Prior Function Level of Independence: Independent with assistive device(s)          PT Goals (current goals can now be found in the care plan section) Acute Rehab PT Goals Patient Stated Goal: to walk, go home PT Goal Formulation: With patient/family Time For Goal Achievement: 01/24/15 Potential to Achieve Goals: Good Progress towards PT goals: Progressing toward goals    Frequency  7X/week    PT Plan Current plan remains appropriate    Co-evaluation             End of Session Equipment  Utilized During Treatment: Gait belt Activity Tolerance: Patient limited by fatigue;Patient limited by pain Patient left: in bed;with call bell/phone within reach;with bed alarm set;with family/visitor present     Time: 1630-1700 PT Time  Calculation (min) (ACUTE ONLY): 30 min  Charges:  $Gait Training: 23-37 mins                    G Codes:      Claretha Cooper 01/17/2015, 5:27 PM

## 2015-01-17 NOTE — Progress Notes (Signed)
Patient ID: Mark Wilkinson, male   DOB: [birth year], 81 y.o.   MRN: [id number] Subjective: 1 Day Post-Op Procedure(s) (LRB): TOTAL LEFT KNEE ARTHROPLASTY (Left)    Patient at baseline pleasant confusion.  Wife states they had a rough night with activity and limited sleep.  No specific events  Objective:   VITALS:   Filed Vitals:   01/17/15 0601  BP: 131/81  Pulse: 82  Temp: 97.6 F (36.4 C)  Resp: 18    Neurovascular intact Incision: dressing C/D/I  LABS  Recent Labs  01/17/15 0515  HGB 12.9*  HCT 38.6*  WBC 13.2*  PLT 141*    No results for input(s): NA, K, BUN, CREATININE, GLUCOSE in the last 72 hours.   Recent Labs  01/16/15 0800  INR 1.12     Assessment/Plan: 1 Day Post-Op Procedure(s) (LRB): TOTAL LEFT KNEE ARTHROPLASTY (Left)   Advance diet Up with therapy Plan for discharge tomorrow with HHPT - [facility] patient

## 2015-01-17 NOTE — Evaluation (Signed)
Physical Therapy Evaluation Patient Details Name: Mark Wilkinson MRN: [id number] DOB: February 11, 1928 Today's Date: 01/17/2015   History of Present Illness  L TKA  Clinical Impression  Patient fatigued easily, unsteady gait, requires close monitoring and steady assist. Wife states  She is only caregiver with family in/out. Patient will benefit from PT to address problems listed in note below.    Follow Up Recommendations Home health PT;Supervision/Assistance - 24 hour    Equipment Recommendations  None recommended by PT    Recommendations for Other Services       Precautions / Restrictions Precautions Precautions: Fall;Knee      Mobility  Bed Mobility Overal bed mobility: Needs Assistance Bed Mobility: Supine to Sit     Supine to sit: Min assist     General bed mobility comments: cues for safety and L leg position, assist for L leg support.  Transfers Overall transfer level: Needs assistance Equipment used: Rolling walker (2 wheeled) Transfers: Sit to/from Stand Sit to Stand: Mod assist;From elevated surface         General transfer comment: cues for hand position and L leg prior to sitting. Multimodal cues for safey and all aspects of mobility.  Ambulation/Gait Ambulation/Gait assistance: Min assist;+2 safety/equipment Ambulation Distance (Feet): 35 Feet   Gait Pattern/deviations: Step-to pattern;Decreased step length - left;Antalgic     General Gait Details: cues to sta within RW., cues for sequence  Stairs            Wheelchair Mobility    Modified Rankin (Stroke Patients Only)       Balance Overall balance assessment: Needs assistance Sitting-balance support: Bilateral upper extremity supported;Feet supported Sitting balance-Leahy Scale: Fair     Standing balance support: During functional activity;Bilateral upper extremity supported Standing balance-Leahy Scale: Poor Standing balance comment: staggers at times, steady assist.                              Pertinent Vitals/Pain Pain Assessment: Faces Faces Pain Scale: Hurts even more Pain Location: L knee Pain Descriptors / Indicators: Aching;Sore;Discomfort Pain Intervention(s): Monitored during session;Ice applied;Premedicated before session    Home Living Family/patient expects to be discharged to:: Private residence Living Arrangements: Spouse/significant other Available Help at Discharge: Family Type of Home: House Home Access: Stairs to enter Entrance Stairs-Rails: Left Entrance Stairs-Number of Steps: 3 Home Layout: One level Home Equipment: [place] - 2 wheels;Bedside commode      Prior Function Level of Independence: Independent with assistive device(s)               Hand Dominance        Extremity/Trunk Assessment               Lower Extremity Assessment: LLE deficits/detail   LLE Deficits / Details: able to lift leg from bed, knee flexion to 80  Cervical / Trunk Assessment: Kyphotic  Communication   Communication: No difficulties  Cognition Arousal/Alertness: Awake/alert Behavior During Therapy: WFL for tasks assessed/performed Overall Cognitive Status: History of cognitive impairments - at baseline                      General Comments      Exercises Total Joint Exercises Ankle Circles/Pumps: AROM;Both;10 reps;Supine Quad Sets: AROM;Both;10 reps;Supine Short Arc Quad: AROM;10 reps;Left;Supine Heel Slides: AROM;Left;10 reps;Supine Hip ABduction/ADduction: AROM;Left;10 reps;Supine Straight Leg Raises: AAROM;Left;10 reps;Supine      Assessment/Plan    PT Assessment Patient needs continued  PT services  PT Diagnosis Difficulty walking;Acute pain   PT Problem List Decreased strength;Decreased range of motion;Decreased activity tolerance;Decreased balance;Decreased mobility;Decreased knowledge of precautions;Decreased safety awareness;Decreased knowledge of use of DME  PT Treatment Interventions Gait  training;DME instruction;Stair training;Functional mobility training;Therapeutic activities;Therapeutic exercise;Patient/family education   PT Goals (Current goals can be found in the Care Plan section) Acute Rehab PT Goals Patient Stated Goal: to walk, go home PT Goal Formulation: With patient/family Time For Goal Achievement: 01/24/15 Potential to Achieve Goals: Good    Frequency 7X/week   Barriers to discharge Decreased caregiver support concern for only wife present for care for patient, family in and out.    Co-evaluation               End of Session Equipment Utilized During Treatment: Gait belt Activity Tolerance: Patient limited by pain;Patient limited by fatigue Patient left: in chair;with call bell/phone within reach;with chair alarm set;with family/visitor present Nurse Communication: Mobility status         Time: 5498-2641 PT Time Calculation (min) (ACUTE ONLY): 22 min   Charges:   PT Evaluation $Initial PT Evaluation Tier I: 1 Procedure     PT G CodesClaretha Cooper 01/17/2015, 5:20 PM Tresa Endo PT [phone]

## 2015-01-18 LAB — BASIC METABOLIC PANEL
Anion gap: 8 (ref 5–15)
BUN: 34 mg/dL — AB (ref 6–20)
CHLORIDE: 105 mmol/L (ref 101–111)
CO2: 25 mmol/L (ref 22–32)
Calcium: 8.1 mg/dL — ABNORMAL LOW (ref 8.9–10.3)
Creatinine, Ser: 2.01 mg/dL — ABNORMAL HIGH (ref 0.61–1.24)
GFR calc Af Amer: 33 mL/min — ABNORMAL LOW (ref >60)
GFR calc non Af Amer: 29 mL/min — ABNORMAL LOW (ref >60)
GLUCOSE: 133 mg/dL — AB (ref 65–99)
POTASSIUM: 4.3 mmol/L (ref 3.5–5.1)
SODIUM: 138 mmol/L (ref 135–145)

## 2015-01-18 LAB — CBC
HEMATOCRIT: 34.3 % — AB (ref 39.0–52.0)
HEMOGLOBIN: 11.3 g/dL — AB (ref 13.0–17.0)
MCH: 30.1 pg (ref 26.0–34.0)
MCHC: 32.9 g/dL (ref 30.0–36.0)
MCV: 91.5 fL (ref 78.0–100.0)
Platelets: 124 10*3/uL — ABNORMAL LOW (ref 150–400)
RBC: 3.75 MIL/uL — AB (ref 4.22–5.81)
RDW: 13.8 % (ref 11.5–15.5)
WBC: 11.1 10*3/uL — ABNORMAL HIGH (ref 4.0–10.5)

## 2015-01-18 MED ORDER — FERROUS SULFATE 325 (65 FE) MG PO TABS
325.0000 mg | ORAL_TABLET | Freq: Three times a day (TID) | ORAL | Status: DC
Start: 2015-01-18 — End: 2015-06-21

## 2015-01-18 MED ORDER — POLYETHYLENE GLYCOL 3350 17 G PO PACK: 17.0000 g | PACK | Freq: Two times a day (BID) | ORAL | Status: DC

## 2015-01-18 MED ORDER — RIVAROXABAN 20 MG PO TABS: 15.0000 mg | ORAL_TABLET | Freq: Every day | ORAL | Status: DC

## 2015-01-18 MED ORDER — ACETAMINOPHEN 500 MG PO TABS
500.0000 mg | ORAL_TABLET | Freq: Four times a day (QID) | ORAL | Status: DC | PRN
  Administered 2015-01-18 – 2015-01-20 (×4): 500 mg via ORAL
  Filled 2015-01-18 (×4): qty 1

## 2015-01-18 MED ORDER — HYDROCODONE-ACETAMINOPHEN 7.5-325 MG PO TABS: 1.0000 | ORAL_TABLET | ORAL | Status: DC | PRN

## 2015-01-18 MED ORDER — RIVAROXABAN 15 MG PO TABS: 15.0000 mg | ORAL_TABLET | Freq: Every day | ORAL | Status: DC

## 2015-01-18 MED ORDER — ACETAMINOPHEN 500 MG PO TABS: 500.0000 mg | ORAL_TABLET | Freq: Four times a day (QID) | ORAL | Status: AC | PRN

## 2015-01-18 MED ORDER — DOCUSATE SODIUM 100 MG PO CAPS: 100.0000 mg | ORAL_CAPSULE | Freq: Two times a day (BID) | ORAL | Status: DC

## 2015-01-18 NOTE — Progress Notes (Signed)
     Subjective: 2 Days Post-Op Procedure(s) (LRB): TOTAL LEFT KNEE ARTHROPLASTY (Left)   Patient reports pain as mild, expressed. Wife states he isn't quite at his baseline metal state, but he usually doesn't until he gets home from the hospital.  No events otherwise throughout the night. Ready to be discharged home.   Objective:   VITALS:   Filed Vitals:   01/18/15  BP: 94/55  Pulse: 93  Temp: 98.1 F (36.7 C)  Resp: 16    Dorsiflexion/Plantar flexion intact Incision: dressing C/D/I No cellulitis present Compartment soft  LABS  Recent Labs  01/17/15 0515 01/18/15 0508  HGB 12.9* 11.3*  HCT 38.6* 34.3*  WBC 13.2* 11.1*  PLT 141* 124*     Recent Labs  01/17/15 0814 01/18/15 0508  NA 139 138  K 4.6 4.3  BUN 23* 34*  CREATININE 1.25* 2.01*  GLUCOSE 134* 133*     Assessment/Plan: 2 Days Post-Op Procedure(s) (LRB): TOTAL LEFT KNEE ARTHROPLASTY (Left) Up with therapy Discharge home with home health  Follow up in 2 weeks at [facility]. Follow up with OLIN,Merari Pion D in 2 weeks.  Contact information:  [facility] [address], [place] [phone]    Obese (BMI 30-39.9) Estimated body mass index is 32.24 kg/(m^2) as calculated from the following:   Height as of this encounter: 5\' 8"  (1.727 m).   Weight as of this encounter: 96.163 kg (212 lb). Patient also counseled that weight may inhibit the healing process Patient counseled that losing weight will help with future health issues         [place] Pugh. Sidra Oldfield   PAC  01/18/2015, 9:48 AM

## 2015-01-18 NOTE — Care Management Important Message (Signed)
Important Message  Patient Details  Name: TAESEAN RETH MRN: [id number] Date of Birth: February 04, 1928   Medicare Important Message Given:       Camillo Flaming 01/18/2015, 2:15 [place] Message  Patient Details  Name: TYREE FLUHARTY MRN: [id number] Date of Birth: [birth year]   Medicare Important Message Given:       Camillo Flaming 01/18/2015, 2:15 PM

## 2015-01-18 NOTE — Care Management Note (Signed)
Case Management Note  Patient Details  Name: Mark Wilkinson MRN: [id number] Date of Birth: 26-Sep-1927  Subjective/Objective:                   TOTAL LEFT KNEE ARTHROPLASTY (Left) Action/Plan: Discharge planning  Expected Discharge Date:  01/18/15              Expected Discharge Plan:  [place]  In-House Referral:     Discharge planning Services  CM Consult  Post Acute Care Choice:    Choice offered to:  Patient, Spouse  DME Arranged:    DME Agency:     HH Arranged:  PT HH Agency:  [place]  Status of Service:  Completed, signed off  Medicare Important Message Given:    Date Medicare IM Given:    Medicare IM give by:    Date Additional Medicare IM Given:    Additional Medicare Important Message give by:     If discussed at [place] of Stay Meetings, dates discussed:    Additional Comments: Cm met with pt in room to offer choice of home health agency.  Pt and spouse choose [facility] to render HHPT.  Pt states he has both a rolling walker and a 3n1 at home.  Address and contact information verified by pt.  Referral emailed to [employer], Tim.  NO other CM needs were communicated. Dellie Catholic, RN 01/18/2015, 9:25 AM

## 2015-01-18 NOTE — Evaluation (Signed)
Occupational Therapy Evaluation Patient Details Name: Mark Wilkinson MRN: [id number] DOB: 1927/06/17 Today's Date: 01/18/2015    History of Present Illness Mark Wilkinson TKA   Clinical Impression   Patient evaluated by Occupational Therapy with no further acute OT needs identified. All education has been completed and the patient has no further questions.  All education completed.  See below for any follow-up Occupational Therapy or equipment needs. OT is signing off. Thank you for this referral.        Follow Up Recommendations  No OT follow up;Supervision/Assistance - 24 hour    Equipment Recommendations  None recommended by OT    Recommendations for Other Services       Precautions / Restrictions Precautions Precautions: Fall;Knee Precaution Comments: monitor BP      Mobility Bed Mobility Overal bed mobility: Needs Assistance Bed Mobility: Supine to Sit     Supine to sit: Mod assist     General bed mobility comments: assist with trunk to upright. extra time for mobility, multimodal cues for  technique.  Transfers Overall transfer level: Needs assistance Equipment used: Rolling walker (2 wheeled) Transfers: Sit to/from [facility] Sit to Stand: Min assist Stand pivot transfers: Min assist       General transfer comment: cues for hand placement and to extend Mark Wilkinson LE in front of him when attempting to stand to reduce pain     Balance Overall balance assessment: Needs assistance Sitting-balance support: Feet supported Sitting balance-Mark Wilkinson Scale: Good     Standing balance support: Single extremity supported;During functional activity Standing balance-Mark Wilkinson Scale: Poor Standing balance comment: requires min A                             ADL Overall ADL's : Needs assistance/impaired Eating/Feeding: Independent;Sitting   Grooming: Wash/dry hands;Wash/dry face;Oral care;Brushing hair;Set up;Sitting   Upper Body Bathing: Set up;Supervision/  safety;Sitting   Lower Body Bathing: Moderate assistance;Sit to/from stand Lower Body Bathing Details (indicate cue type and reason): Pt able lean forward to access Mark Wilkinson ankle for LB batthing  Upper Body Dressing : Supervision/safety;Set up;Sitting   Lower Body Dressing: Moderate assistance;Sit to/from stand Lower Body Dressing Details (indicate cue type and reason): Pt able to doff slipper with supervision, donned it with min A .  He will require assist with donning socks and pulling pants over hips.  Wife reports she feels confident in her ability to assist him  Toilet Transfer: Min guard;Ambulation;Comfort height toilet;Grab bars;RW   Toileting- Clothing Manipulation and Hygiene: Minimal assistance;Sit to/from stand     Tub/Shower Transfer Details (indicate cue type and reason): Instructed wife in use of tub transfer bench (daughter is going to acquire one for home use).  Functional mobility during ADLs: Min guard;Minimal assistance;Rolling walker General ADL Comments: wife reports she is able to assist pt at discharge and feels she can asisst him.  Encouraged her to allow him to perform LB ADLs as independently as possible to faciilitate knee flexion and independence      Vision     Perception     Praxis      Pertinent Vitals/Pain Pain Assessment: Faces Faces Pain Scale: Hurts even more Pain Location: Mark Wilkinson knee Pain Descriptors / Indicators: Grimacing Pain Intervention(s): Monitored during session;Repositioned;Ice applied     Hand Dominance     Extremity/Trunk Assessment Upper Extremity Assessment Upper Extremity Assessment: Overall WFL for tasks assessed   Lower Extremity Assessment Lower Extremity Assessment: Defer to PT  evaluation   Cervical / Trunk Assessment Cervical / Trunk Assessment: Kyphotic   Communication Communication Communication: No difficulties   Cognition Arousal/Alertness: Awake/alert Behavior During Therapy: Impulsive Overall Cognitive Status:  History of cognitive impairments - at baseline                     General Comments       Exercises       Shoulder Instructions      Home Living Family/patient expects to be discharged to:: Private residence Living Arrangements: Spouse/significant other Available Help at Discharge: Family Type of Home: House Home Access: Stairs to enter Technical brewer of Steps: 3 Entrance Stairs-Rails: Left Home Layout: One level     Bathroom Shower/Tub: Tub/shower unit Shower/tub characteristics: Curtain Biochemist, clinical: Standard     Home Equipment: Environmental consultant - 2 wheels;Bedside commode          Prior Functioning/Environment Level of Independence: Independent with assistive device(s)        Comments: Pt required supervision for ADLs     OT Diagnosis: Generalized weakness;Acute pain   OT Problem List: Decreased strength;Impaired balance (sitting and/or standing);Pain;Decreased safety awareness;Decreased knowledge of use of DME or AE;Decreased knowledge of precautions   OT Treatment/Interventions:      OT Goals(Current goals can be found in the care plan section) Acute Rehab OT Goals Patient Stated Goal: to go home OT Goal Formulation: All assessment and education complete, DC therapy  OT Frequency: Min 2X/week   Barriers to D/C:            Co-evaluation              End of Session Equipment Utilized During Treatment: Rolling walker Nurse Communication: Patient requests pain meds  Activity Tolerance: Patient tolerated treatment well Patient left: in chair;with call bell/phone within reach;with family/visitor present   Time: 5732-2025 OT Time Calculation (min): 30 min Charges:  OT General Charges $OT Visit: 1 Procedure OT Evaluation $Initial OT Evaluation Tier I: 1 Procedure OT Treatments $Self Care/Home Management : 8-22 mins G-Codes:    Mark Wilkinson M 04-Feb-2015, 2:20 PM

## 2015-01-18 NOTE — Progress Notes (Signed)
Physical Therapy Treatment Patient Details Name: Mark Wilkinson MRN: [id number] DOB: Jun 05, 1927 Today's Date: 01/18/2015    History of Present Illness L TKA    PT Comments    Patient is unsteady with ambulation, unsteady on steops, is forgetfull and tries to get up in a hurry. Discuused with wife  Concerns for her being alone with patient at his current status for ambulation and safety. Asked Sarah, RN to assist with discussion with wife about  Delaying DC until tomorrow for therapy to work further on safety and strength prior to Dc. Wife agreeable and patient also,styated" well, if that is best, I can".. Plan several sessions and practice steps tomorrow. May procure a KI for negotiating steps for safety.   Follow Up Recommendations  Home health PT;Supervision/Assistance - 24 hour     Equipment Recommendations  None recommended by PT    Recommendations for Other Services       Precautions / Restrictions Precautions Precautions: Fall;Knee Precaution Comments: monitor BP, HR    Mobility  Bed Mobility Overal bed mobility: Needs Assistance Bed Mobility: Supine to Sit;Sit to Supine     Supine to sit: Min assist Sit to supine: Min assist   General bed mobility comments: assist with trunk to upright. extra time for mobility, multimodal cues for  technique.  Transfers Overall transfer level: Needs assistance Equipment used: Rolling walker (2 wheeled) Transfers: Sit to/from Stand Sit to Stand: Mod assist         General transfer comment: cues for hand placement and to extend Lt LE in front of him when attempting to stand to reduce pain , .  Ambulation/Gait Ambulation/Gait assistance: Mod assist Ambulation Distance (Feet): 25 Feet Assistive device: Rolling walker (2 wheeled) Gait Pattern/deviations: Step-to pattern;Trunk flexed;Shuffle;Staggering left;Staggering right Gait velocity: decreased   General Gait Details: cues to stay within RW., cues for sequence, cues for  safety,, ambulated x 25',85'27'. with rest between as pt. noted to be SOB. sats >95%, HR max 120    Stairs Stairs: Yes Stairs assistance: +2 safety/equipment Stair Management: With cane;Forwards;Step to pattern;One rail Left Number of Stairs: 2 General stair comments: decreased safety, close supervision for L knee buckle potential. wife present. 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.)                      Exercises Total Joint Exercises Ankle Circles/Pumps: AROM;Both;10 reps;Supine Quad Sets: AROM;Both;10 reps;Supine Short Arc Quad: AROM;10 reps;Left;Supine Heel Slides: AROM;Left;10 reps;Supine Hip ABduction/ADduction: AROM;Left;10 reps;Supine Straight Leg Raises: AAROM;Left;10 reps;Supine Goniometric ROM: 10-90 L knee flexion    General Comments        Pertinent Vitals/Pain Pain Assessment: Faces Faces Pain Scale: Hurts whole lot Pain Location: L knee Pain Descriptors / Indicators: Aching Pain Intervention(s): Limited activity within patient's tolerance;Premedicated before session    Home Living Family/patient expects to be discharged to:: Private residence Living Arrangements: Spouse/significant other Available Help at Discharge: Family Type of Home: House Home Access: Stairs to enter Entrance Stairs-Rails: Left Home Layout: One  level Home Equipment: Walker - 2 wheels;Bedside commode      Prior Function Level of Independence: Independent with assistive device(s)      Comments: Pt required supervision for ADLs    PT Goals (current goals can now be found in  the care plan section) Progress towards PT goals: Progressing toward goals    Frequency  7X/week    PT Plan Current plan remains appropriate    Co-evaluation             End of Session Equipment Utilized During Treatment: Gait belt Activity Tolerance: Patient limited by pain;Patient limited by fatigue Patient left: in bed;with call bell/phone within reach;with bed alarm set;with family/visitor present     Time: 1545-1630 PT Time Calculation (min) (ACUTE ONLY): 45 min  Charges:  $Gait Training: 23-37 mins $Therapeutic Exercise: 8-22 mins $Self Care/Home Management: January 17, 2023                    G Codes:      Claretha Cooper 01/18/2015, 5:33 PM

## 2015-01-18 NOTE — Discharge Instructions (Signed)

## 2015-01-18 NOTE — Progress Notes (Signed)
Physical Therapy Treatment Patient Details Name: Mark Wilkinson MRN: [id number] DOB: [birth year] Today's Date: 01/18/2015    History of Present Illness L TKA    PT Comments    Patient's BP supine 93/54, sit=105/60, after 45'=87/61, after 3rd walk=100/62. No c/o dizziness. will practice steps this PM. Spoke with wife who is eager for pt. To DC to home. She reports family is available to be there PRN. Encouraged her to have someone tonight  After DC.   Follow Up Recommendations  Home health PT;Supervision/Assistance - 24 hour     Equipment Recommendations  None recommended by PT    Recommendations for Other Services       Precautions / Restrictions Precautions Precautions: Fall;Knee Precaution Comments: monitor BP    Mobility  Bed Mobility Overal bed mobility: Needs Assistance Bed Mobility: Supine to Sit     Supine to sit: Mod assist     General bed mobility comments: assist with trunk to upright. extra time for mobility, multimodal cues for  technique.  Transfers Overall transfer level: Needs assistance Equipment used: Rolling walker (2 wheeled) Transfers: Sit to/from [facility] Sit to Stand: Mod assist Stand pivot transfers: Min assist       General transfer comment: cues for hand placement and to extend Lt LE in front of him when attempting to stand to reduce pain , lifting assist from recliner x 3 trials.  Ambulation/Gait Ambulation/Gait assistance: Min assist;+2 safety/equipment   Assistive device: Rolling walker (2 wheeled) Gait Pattern/deviations: Step-to pattern;Trunk flexed;Decreased step length - left Gait velocity: decreased   General Gait Details: cues to stay within RW., cues for sequence, cues for safety,, ambulated x 09',73'53'. with rest between as pt. noted to be SOB. sats >95%, HR max 120    Stairs            Wheelchair Mobility    Modified Rankin (Stroke Patients Only)       Balance Overall balance assessment:  Needs assistance Sitting-balance support: Feet supported Sitting balance-Leahy Scale: Good     Standing balance support: Bilateral upper extremity supported;During functional activity Standing balance-Leahy Scale: Poor Standing balance comment: requires min A                     Cognition Arousal/Alertness: Awake/alert Behavior During Therapy: Impulsive Overall Cognitive Status: History of cognitive impairments - at baseline                      Exercises Total Joint Exercises Ankle Circles/Pumps: AROM;Both;10 reps;Supine Quad Sets: AROM;Both;10 reps;Supine Short Arc Quad: AROM;10 reps;Left;Supine Heel Slides: AROM;Left;10 reps;Supine Hip ABduction/ADduction: AROM;Left;10 reps;Supine Straight Leg Raises: AAROM;Left;10 reps;Supine Goniometric ROM: 10-90 L knee flexion    General Comments        Pertinent Vitals/Pain Pain Assessment: Faces Faces Pain Scale: Hurts even more Pain Location: Lt knee Pain Descriptors / Indicators: Grimacing Pain Intervention(s): Monitored during session;Repositioned;Ice applied    Home Living Family/patient expects to be discharged to:: Private residence Living Arrangements: Spouse/significant other Available Help at Discharge: Family Type of Home: House Home Access: Stairs to enter Entrance Stairs-Rails: Left Home Layout: One level Home Equipment: Environmental consultant - 2 wheels;Bedside commode      Prior Function Level of Independence: Independent with assistive device(s)      Comments: Pt required supervision for ADLs    PT Goals (current goals can now be found in the care plan section) Acute Rehab PT Goals Patient Stated Goal: to go home Progress towards  PT goals: Progressing toward goals    Frequency  7X/week    PT Plan Current plan remains appropriate    Co-evaluation             End of Session Equipment Utilized During Treatment: Gait belt Activity Tolerance: Patient limited by pain;Patient limited by  fatigue Patient left: in chair;with call bell/phone within reach;with chair alarm set;with family/visitor present     Time: 0932-1022 PT Time Calculation (min) (ACUTE ONLY): 50 min  Charges:  $Gait Training: 23-37 mins $Therapeutic Exercise: 8-22 mins                    G Codes:      Claretha Cooper 01/18/2015, 2:22 PM

## 2015-01-19 LAB — BASIC METABOLIC PANEL
Anion gap: 7 (ref 5–15)
BUN: 38 mg/dL — AB (ref 6–20)
CALCIUM: 8.5 mg/dL — AB (ref 8.9–10.3)
CO2: 24 mmol/L (ref 22–32)
Chloride: 108 mmol/L (ref 101–111)
Creatinine, Ser: 1.66 mg/dL — ABNORMAL HIGH (ref 0.61–1.24)
GFR calc Af Amer: 42 mL/min — ABNORMAL LOW (ref >60)
GFR, EST NON AFRICAN AMERICAN: 36 mL/min — AB (ref >60)
GLUCOSE: 217 mg/dL — AB (ref 65–99)
POTASSIUM: 4.5 mmol/L (ref 3.5–5.1)
SODIUM: 139 mmol/L (ref 135–145)

## 2015-01-19 LAB — CBC
HCT: 32.9 % — ABNORMAL LOW (ref 39.0–52.0)
Hemoglobin: 11.1 g/dL — ABNORMAL LOW (ref 13.0–17.0)
MCH: 30.5 pg (ref 26.0–34.0)
MCHC: 33.7 g/dL (ref 30.0–36.0)
MCV: 90.4 fL (ref 78.0–100.0)
PLATELETS: 107 10*3/uL — AB (ref 150–400)
RBC: 3.64 MIL/uL — ABNORMAL LOW (ref 4.22–5.81)
RDW: 13.6 % (ref 11.5–15.5)
WBC: 8.9 10*3/uL (ref 4.0–10.5)

## 2015-01-19 MED ORDER — SODIUM CHLORIDE 0.9 % IV BOLUS (SEPSIS)
500.0000 mL | Freq: Once | INTRAVENOUS | Status: AC
  Administered 2015-01-19: 500 mL via INTRAVENOUS

## 2015-01-19 MED ORDER — ATENOLOL 25 MG PO TABS
25.0000 mg | ORAL_TABLET | Freq: Once | ORAL | Status: AC
  Administered 2015-01-19: 25 mg via ORAL
  Filled 2015-01-19: qty 1

## 2015-01-19 NOTE — Progress Notes (Signed)
     Subjective: 3 Days Post-Op Procedure(s) (LRB): TOTAL LEFT KNEE ARTHROPLASTY (Left)   Patient expresses no pain while laying in bed.  Patient wife and daughter state that he is a little more confused today, may be from being out of his normal environment. Concern from his increased HR. Creatinine dropped from 2.01 to 1.66.  Objective:   VITALS:   Filed Vitals:   01/19/15  BP: 150/70  Pulse: 126  Temp: 97.7 F (36.6 C)   Resp: 16150    Dorsiflexion/Plantar flexion intact Incision: dressing C/D/I No cellulitis present Compartment soft  LABS  Recent Labs  01/17/15 0515 01/18/15 0508 01/19/15 0944  HGB 12.9* 11.3* 11.1*  HCT 38.6* 34.3* 32.9*  WBC 13.2* 11.1* 8.9  PLT 141* 124* 107*     Recent Labs  01/17/15 0814 01/18/15 0508 01/19/15 0944  NA 139 138 139  K 4.6 4.3 4.5  BUN 23* 34* 38*  CREATININE 1.25* 2.01* 1.66*  GLUCOSE 134* 133* 217*     Assessment/Plan: 3 Days Post-Op Procedure(s) (LRB): TOTAL LEFT KNEE ARTHROPLASTY (Left) Up with therapy Discharge home with home health, when ready Follow up in 2 weeks at [facility].  Obese (BMI 30-39.9) Estimated body mass index is 32.24 kg/(m^2) as calculated from the following:   Height as of this encounter: 5\' 8"  (1.727 m).   Weight as of this encounter: 96.163 kg (212 lb). Patient also counseled that weight may inhibit the healing process Patient counseled that losing weight will help with future health issues     [place] Pugh. Rhen Dossantos   PAC  01/19/2015, 1:51 PM

## 2015-01-19 NOTE — Progress Notes (Signed)
Physical Therapy Treatment Patient Details Name: Mark Wilkinson MRN: [id number] DOB: 05-Jul-1927 Today's Date: 01/19/2015    History of Present Illness L TKA    PT Comments    Patient with increased dyspnea w/ activity, ambulated x 25' x 2, HR 131, BP 104/65. RN notified. Patient is very unsteady ambulating today. Requires steady assist to stand. Mod assist for ambulation for balance. Have obtained a KI for safe ambulation until improved balance, especially for home safety as wife is primary caregiver. Will see this PM if HR not high  At rest.  Follow Up Recommendations  Home health PT;Supervision/Assistance - 24 hour     Equipment Recommendations  None recommended by PT    Recommendations for Other Services       Precautions / Restrictions Precautions Precautions: Fall;Knee Precaution Comments: monitor BP, HR Required Braces or Orthoses: Knee Immobilizer - Left Knee Immobilizer - Left: Other (comment) (pT ordered ZKI for safety as L knee is buckling and patient is very unsteady,anxious) Restrictions Weight Bearing Restrictions: No    Mobility  Bed Mobility   Bed Mobility: Supine to Sit     Supine to sit: Mod assist     General bed mobility comments: assist with trunk to upright. extra time for mobility, multimodal cues for  technique.more assistance today. noted wheezing,  increased WOB with efforts  Transfers Overall transfer level: Needs assistance Equipment used: Rolling walker (2 wheeled)   Sit to Stand: Mod assist;From elevated surface;Max assist         General transfer comment: steady, lifting assist to stand from raised bed, mod assist to sit down to toilet and recliner, multimodal cues, does not recall to step  L leg out, therefore knee  bends,  PT attempst to assist patient with stepping leg out but patient is impulsive.Max assist to stand from  toilet as riser not available due to urgency to have BM.  Ambulation/Gait Ambulation/Gait assistance: Mod  assist;+2 safety/equipment Ambulation Distance (Feet): 25 Feet (x2) Assistive device: Rolling walker (2 wheeled) Gait Pattern/deviations: Step-to pattern;Antalgic;Decreased step length - left;Decreased stance time - left Gait velocity: decreased   General Gait Details: cues to stay within RW., cues for sequence, cues for safety,, L leg tends to externally rotate, patient is unable to process instructions to self correct. HR 131, BP 104/65.  HR after rest varies 115-122. RN aware   Stairs            Wheelchair Mobility    Modified Rankin (Stroke Patients Only)       Balance           Standing balance support: During functional activity;Bilateral upper extremity supported Standing balance-Leahy Scale: Poor Standing balance comment: does stand  to wash hands without holding briefley                    Cognition Arousal/Alertness: Awake/alert Behavior During Therapy: Impulsive;Anxious Overall Cognitive Status: History of cognitive impairments - at baseline (may be a bit more confused)       Memory: Decreased recall of precautions;Decreased short-term memory              Exercises      General Comments        Pertinent Vitals/Pain Faces Pain Scale: Hurts even more Pain Location: L knee Pain Descriptors / Indicators: Discomfort;Grimacing Pain Intervention(s): Limited activity within patient's tolerance;Monitored during session;Premedicated before session;Ice applied    Home Living  Prior Function            PT Goals (current goals can now be found in the care plan section) Progress towards PT goals: Not progressing toward goals - comment (now with increased fatigue and HR)    Frequency  7X/week    PT Plan Current plan remains appropriate (patient may benefit from st rehab but wife has stated that plan is home, has  asked family to come and help with care.)    Co-evaluation             End of Session  Equipment Utilized During Treatment: Gait belt Activity Tolerance: Patient limited by fatigue;Treatment limited secondary to medical complications (Comment) (increased HR) Patient left: in chair;with call bell/phone within reach;with chair alarm set;with family/visitor present     Time: 6578-4696 PT Time Calculation (min) (ACUTE ONLY): 28 min  Charges:  $Gait Training: 8-22 mins $Self Care/Home Management: 8-22                    G Codes:      Claretha Cooper 01/19/2015, 10:58 AM Tresa Endo PT [phone]

## 2015-01-19 NOTE — Progress Notes (Signed)
Physical Therapy Treatment Patient Details Name: Mark Wilkinson MRN: [id number] DOB: 09-Mar-1928 Today's Date: 01/19/2015    History of Present Illness L TKA    PT Comments    Patient  Continues to be unsteady walking, HR remains increased 121, Utilzed KI for safety and support  On L knee which did offer increased support. Communication w/ MD about current functional status. Wife reports  She has increased family support at home. Patient is impulsive, when he has urge to urinate, he stars trying to get up.  Follow Up Recommendations  Home health PT;Supervision/Assistance - 24 hour (wife declines rehab for patient.)     Equipment Recommendations  None recommended by PT    Recommendations for Other Services       Precautions / Restrictions Precautions Precautions: Fall;Knee Precaution Comments: monitor BP, HR Required Braces or Orthoses: Knee Immobilizer - Left    Mobility  Bed Mobility           Sit to supine: Mod assist   General bed mobility comments: assist LLE onto bed, multimodal cues for safety  Transfers   Equipment used: Rolling walker (2 wheeled) Transfers: Sit to/from Stand Sit to Stand: Mod assist         General transfer comment: steady, lifting assist to stand from , multimodal cues, KI in place  and is helpful for pain control.  Ambulation/Gait Ambulation/Gait assistance: Mod assist;+2 safety/equipment Ambulation Distance (Feet): 15 Feet Assistive device: Rolling walker (2 wheeled) Gait Pattern/deviations: Step-to pattern;Antalgic Gait velocity: decreased   General Gait Details: cues to stay within RW., cues for sequence, cues for safety,, L leg tends to externally rotate, patient is unable to process instructions to self correct. HR 121,    Stairs            Wheelchair Mobility    Modified Rankin (Stroke Patients Only)       Balance           Standing balance support: During functional activity;Bilateral upper extremity  supported Standing balance-Leahy Scale: Poor                      Cognition Arousal/Alertness: Awake/alert Behavior During Therapy: Impulsive;Anxious Overall Cognitive Status: History of cognitive impairments - at baseline                      Exercises      General Comments        Pertinent Vitals/Pain Faces Pain Scale: Hurts even more Pain Location: L knee Pain Descriptors / Indicators: Discomfort;Grimacing Pain Intervention(s): Limited activity within patient's tolerance;Monitored during session;Patient requesting pain meds-RN notified    Home Living                      Prior Function            PT Goals (current goals can now be found in the care plan section) Progress towards PT goals: Not progressing toward goals - comment (limited by fatigue and SOB, increased HR)    Frequency  7X/week    PT Plan Current plan remains appropriate    Co-evaluation             End of Session Equipment Utilized During Treatment: Gait belt Activity Tolerance: Patient limited by fatigue;Treatment limited secondary to medical complications (Comment) Patient left: in bed;with call bell/phone within reach;with bed alarm set;with family/visitor present     Time: 3704-8889 PT Time Calculation (min) (ACUTE ONLY): 32  min  Charges:  $Gait Training: 23-37 mins                    G Codes:      Claretha Cooper 01/19/2015, 4:25 PM

## 2015-01-20 LAB — CBC
HEMATOCRIT: 34 % — AB (ref 39.0–52.0)
HEMOGLOBIN: 11.3 g/dL — AB (ref 13.0–17.0)
MCH: 30 pg (ref 26.0–34.0)
MCHC: 33.2 g/dL (ref 30.0–36.0)
MCV: 90.2 fL (ref 78.0–100.0)
Platelets: 137 10*3/uL — ABNORMAL LOW (ref 150–400)
RBC: 3.77 MIL/uL — ABNORMAL LOW (ref 4.22–5.81)
RDW: 13.8 % (ref 11.5–15.5)
WBC: 13.1 10*3/uL — ABNORMAL HIGH (ref 4.0–10.5)

## 2015-01-20 LAB — BASIC METABOLIC PANEL
Anion gap: 6 (ref 5–15)
BUN: 27 mg/dL — AB (ref 6–20)
CHLORIDE: 109 mmol/L (ref 101–111)
CO2: 27 mmol/L (ref 22–32)
CREATININE: 1.19 mg/dL (ref 0.61–1.24)
Calcium: 9.2 mg/dL (ref 8.9–10.3)
GFR calc Af Amer: >60 mL/min (ref >60)
GFR calc non Af Amer: 54 mL/min — ABNORMAL LOW (ref >60)
Glucose, Bld: 176 mg/dL — ABNORMAL HIGH (ref 65–99)
Potassium: 5.2 mmol/L — ABNORMAL HIGH (ref 3.5–5.1)
Sodium: 142 mmol/L (ref 135–145)

## 2015-01-20 MED ORDER — METOPROLOL TARTRATE 25 MG PO TABS
25.0000 mg | ORAL_TABLET | Freq: Once | ORAL | Status: AC
  Administered 2015-01-20: 25 mg via ORAL
  Filled 2015-01-20: qty 1

## 2015-01-20 MED ORDER — ATENOLOL 50 MG PO TABS: 25.0000 mg | ORAL_TABLET | Freq: Two times a day (BID) | ORAL | Status: DC

## 2015-01-20 MED ORDER — ATENOLOL 50 MG PO TABS: 50.0000 mg | ORAL_TABLET | Freq: Two times a day (BID) | ORAL | Status: DC

## 2015-01-20 MED ORDER — RIVAROXABAN 20 MG PO TABS: 20.0000 mg | ORAL_TABLET | ORAL | Status: DC

## 2015-01-20 MED ORDER — RIVAROXABAN 20 MG PO TABS
20.0000 mg | ORAL_TABLET | ORAL | Status: DC
  Administered 2015-01-20: 20 mg via ORAL
  Filled 2015-01-20 (×2): qty 1

## 2015-01-20 MED ORDER — METHOCARBAMOL 500 MG PO TABS: 500.0000 mg | ORAL_TABLET | Freq: Four times a day (QID) | ORAL | Status: DC | PRN

## 2015-01-20 NOTE — Care Management Important Message (Signed)
Important Message  Patient Details  Name: ZYREE TRAYNHAM MRN: [id number] Date of Birth: [birth year]   Medicare Important Message Given:  Yes-second notification given    Camillo Flaming 01/20/2015, 1:39 [place] Message  Patient Details  Name: EZEKIAL ARNS MRN: [id number] Date of Birth: January 14, 1928   Medicare Important Message Given:  Yes-second notification given    Camillo Flaming 01/20/2015, 1:39 PM

## 2015-01-20 NOTE — Progress Notes (Signed)
Physical Therapy Treatment Patient Details Name: Mark Wilkinson MRN: [id number] DOB: 1928-04-27 Today's Date: 01/20/2015    History of Present Illness L TKA    PT Comments    Patient is more confused , very drowsy. Multimodal and frequent cues for safety walking and on 2 steps. Decreased ability to follow directions. Wife and daughter present  To observe. Wife does want to take patient home via personal car.  Follow Up Recommendations  Home health PT;Supervision/Assistance - 24 hour     Equipment Recommendations       Recommendations for Other Services       Precautions / Restrictions Precautions Precautions: Fall;Knee Precaution Comments: monitor BP, HR Required Braces or Orthoses: Knee Immobilizer - Left    Mobility  Bed Mobility   Bed Mobility: Supine to Sit     Supine to sit: Mod assist     General bed mobility comments: assist LLE of of bed, assist w/ trunk, multimodal cues for safety  Transfers Overall transfer level: Needs assistance Equipment used: Rolling walker (2 wheeled) Transfers: Sit to/from Stand Sit to Stand: Mod assist;Max assist;+2 safety/equipment;+2 physical assistance Stand pivot transfers: +2 physical assistance;+2 safety/equipment       General transfer comment: steady, lifting assist to stand  , multimodal cues, KI in place  and is helpful for pain control. patient unable to follow directions for L leg position  Ambulation/Gait Ambulation/Gait assistance: Mod assist;+2 physical assistance;+2 safety/equipment Ambulation Distance (Feet): 30 Feet Assistive device: Rolling walker (2 wheeled)   Gait velocity: decreased       Stairs Stairs: Yes Stairs assistance: Max assist Stair Management: One rail Left;With cane Number of Stairs: 2 General stair comments: decreased safety, multimodal cues for  safety and sequence, tactile cues  for proper stepping.  Wheelchair Mobility    Modified Rankin (Stroke Patients Only)       Balance                                     Cognition Arousal/Alertness: Awake/alert Behavior During Therapy: Impulsive Overall Cognitive Status: History of cognitive impairments - at baseline       Memory: Decreased recall of precautions;Decreased short-term memory              Exercises      General Comments        Pertinent Vitals/Pain Pain Assessment: Faces Faces Pain Scale: Hurts whole lot Pain Location: "my left Knee" Pain Descriptors / Indicators: Discomfort;Grimacing Pain Intervention(s): Limited activity within patient's tolerance;Monitored during session;Premedicated before session;Repositioned    Home Living                      Prior Function            PT Goals (current goals can now be found in the care plan section) Progress towards PT goals: Progressing toward goals    Frequency       PT Plan Current plan remains appropriate    Co-evaluation             End of Session Equipment Utilized During Treatment: Gait belt Activity Tolerance: Patient limited by fatigue;Patient limited by pain Patient left: in chair;with call bell/phone within reach;with chair alarm set;with family/visitor present     Time: 0928-1020 PT Time Calculation (min) (ACUTE ONLY): 52 min  Charges:  $Gait Training: 38-52 mins  G Codes:      Claretha Cooper 01/20/2015, 1:26 PM

## 2015-01-20 NOTE — Progress Notes (Signed)
Physical Therapy Treatment Patient Details Name: Mark Wilkinson MRN: [id number] DOB: 02-21-1928 Today's Date: 01/20/2015    History of Present Illness L TKA    PT Comments    Very confused but more aroused. Family taking patient home today. Instructed wife and daughter on KI donning/  Follow Up Recommendations  Home health PT;Supervision/Assistance - 24 hour     Equipment Recommendations       Recommendations for Other Services       Precautions / Restrictions Precautions Precautions: Fall;Knee Precaution Comments: monitor BP, HR Required Braces or Orthoses: Knee Immobilizer - Left    Mobility  Bed Mobility   Bed Mobility: Supine to Sit     Supine to sit: Mod assist     General bed mobility comments: assist LLE of of bed, assist w/ trunk, multimodal cues for safety  Transfers Overall transfer level: Needs assistance Equipment used: Rolling walker (2 wheeled) Transfers: Sit to/from Stand Sit to Stand: +2 safety/equipment;+2 physical assistance;Mod assist Stand pivot transfers: +2 physical assistance;+2 safety/equipment       General transfer comment: steady, lifting assist to stand  , multimodal cues, KI in place  and is helpful for pain control. patient unable to follow directions for L leg position, stood for pants to be pulled up, cues to reach back to recliner, difficult to follow instructions.  Ambulation/Gait Ambulation/Gait assistance: Mod assist;+2 physical assistance;+2 safety/equipment Ambulation Distance (Feet): 30 Feet Assistive device: Rolling walker (2 wheeled)   Gait velocity: decreased       Stairs Stairs: Yes Stairs assistance: Max assist Stair Management: One rail Left;With cane Number of Stairs: 2 General stair comments: decreased safety, multimodal cues for  safety and sequence, tactile cues  for proper stepping.  Wheelchair Mobility    Modified Rankin (Stroke Patients Only)       Balance                                     Cognition Arousal/Alertness: Lethargic Behavior During Therapy: Impulsive Overall Cognitive Status: History of cognitive impairments - at baseline       Memory: Decreased recall of precautions;Decreased short-term memory              Exercises      General Comments        Pertinent Vitals/Pain Pain Assessment: Faces Faces Pain Scale: Hurts even more Pain Location: my knee Pain Descriptors / Indicators: Discomfort;Grimacing Pain Intervention(s): Limited activity within patient's tolerance;Monitored during session;Premedicated before session;Repositioned    Home Living                      Prior Function            PT Goals (current goals can now be found in the care plan section) Progress towards PT goals: Progressing toward goals    Frequency       PT Plan Current plan remains appropriate    Co-evaluation             End of Session Equipment Utilized During Treatment: Gait belt Activity Tolerance: Patient limited by fatigue;Patient limited by pain Patient left: in chair;with call bell/phone within reach;with chair alarm set;with family/visitor present     Time: 1024-1040 PT Time Calculation (min) (ACUTE ONLY): 16 min  Charges:  $Gait Training: 38-52 mins $Therapeutic Activity: 8-22 mins  G Codes:      Claretha Cooper 01/20/2015, 4:33 PM

## 2015-01-20 NOTE — Progress Notes (Signed)
     Subjective: 4 Days Post-Op Procedure(s) (LRB): TOTAL LEFT KNEE ARTHROPLASTY (Left)   Patient expresses no pain while sitting in his chair. Patient wife express that he is still confused and a little agitated, but doing well.  They are planning on getting a CNA to help during the days.  Wife feels that if he gets into his normal environment, he will return more to a normal state.Marland Kitchen  His creatinine dropped again from 1.66 to 1.19.  Incident with a HR of 140's during the night. An additional dose of beta block was given and HR seems more stable near 100.  Objective:   VITALS:   Filed Vitals:   01/20/15 0406  BP: 149/88  Pulse: 103  Temp: 99.2 F (37.3 C)  Resp: 22    Dorsiflexion/Plantar flexion intact Incision: dressing C/D/I No cellulitis present Compartment soft  LABS  Recent Labs  01/18/15 0508 01/19/15 0944 01/20/15 0603  HGB 11.3* 11.1* 11.3*  HCT 34.3* 32.9* 34.0*  WBC 11.1* 8.9 13.1*  PLT 124* 107* 137*     Recent Labs  01/18/15 0508 01/19/15 0944 01/20/15 0603  NA 138 139 142  K 4.3 4.5 5.2*  BUN 34* 38* 27*  CREATININE 2.01* 1.66* 1.19  GLUCOSE 133* 217* 176*     Assessment/Plan: 4 Days Post-Op Procedure(s) (LRB): TOTAL LEFT KNEE ARTHROPLASTY (Left) Up with therapy Discharge home with home health  Follow up in 2 weeks at [facility]. Follow up with OLIN,Shantasia Hunnell D in 2 weeks.  Contact information:  [facility] [address], [address] [place] Jahquan Klugh   PAC  01/20/2015, 7:19 AM

## 2015-01-20 NOTE — Progress Notes (Signed)
01/20/15 [place] PA called reg patients continually high blood pressures and pulses tonight. Atenolol given at 10 pm . Order received for one dose of metoprolo to be given now. Will continue to monitor patient.

## 2015-01-20 NOTE — Progress Notes (Signed)
RN reviewed discharge instructions with patient and daughter. All questions answered.  Paperwork and prescriptions given.   NT rolled patient down in wheelchair to family car.

## 2015-01-21 DIAGNOSIS — M159 Polyosteoarthritis, unspecified: Secondary | ICD-10-CM | POA: Diagnosis not present

## 2015-01-21 DIAGNOSIS — F0390 Unspecified dementia without behavioral disturbance: Secondary | ICD-10-CM | POA: Diagnosis not present

## 2015-01-21 DIAGNOSIS — G8929 Other chronic pain: Secondary | ICD-10-CM | POA: Diagnosis not present

## 2015-01-21 DIAGNOSIS — Z471 Aftercare following joint replacement surgery: Secondary | ICD-10-CM | POA: Diagnosis not present

## 2015-01-21 DIAGNOSIS — I481 Persistent atrial fibrillation: Secondary | ICD-10-CM | POA: Diagnosis not present

## 2015-01-21 DIAGNOSIS — I10 Essential (primary) hypertension: Secondary | ICD-10-CM | POA: Diagnosis not present

## 2015-01-23 ENCOUNTER — Telehealth: Payer: Self-pay | Admitting: Cardiology

## 2015-01-23 ENCOUNTER — Emergency Department (HOSPITAL_COMMUNITY): Payer: Medicare Other

## 2015-01-23 ENCOUNTER — Encounter: Payer: Self-pay | Admitting: Family Medicine

## 2015-01-23 ENCOUNTER — Inpatient Hospital Stay (HOSPITAL_COMMUNITY)
Admission: EM | Admit: 2015-01-23 | Discharge: 2015-01-27 | DRG: 308 | Disposition: A | Discharge status: Home / Self Care | Payer: Medicare Other | Attending: Internal Medicine | Admitting: Internal Medicine

## 2015-01-23 ENCOUNTER — Ambulatory Visit (INDEPENDENT_AMBULATORY_CARE_PROVIDER_SITE_OTHER): Payer: Medicare Other | Admitting: Family Medicine

## 2015-01-23 ENCOUNTER — Encounter (HOSPITAL_COMMUNITY): Payer: Self-pay

## 2015-01-23 ENCOUNTER — Ambulatory Visit (INDEPENDENT_AMBULATORY_CARE_PROVIDER_SITE_OTHER): Payer: Medicare Other

## 2015-01-23 VITALS — BP 112/80 | HR 89 | Temp 97.1°F | Ht 68.0 in | Wt 212.0 lb

## 2015-01-23 DIAGNOSIS — I5033 Acute on chronic diastolic (congestive) heart failure: Secondary | ICD-10-CM | POA: Diagnosis present

## 2015-01-23 DIAGNOSIS — R059 Cough, unspecified: Secondary | ICD-10-CM

## 2015-01-23 DIAGNOSIS — Z09 Encounter for follow-up examination after completed treatment for conditions other than malignant neoplasm: Secondary | ICD-10-CM

## 2015-01-23 DIAGNOSIS — G8929 Other chronic pain: Secondary | ICD-10-CM | POA: Diagnosis not present

## 2015-01-23 DIAGNOSIS — E669 Obesity, unspecified: Secondary | ICD-10-CM | POA: Diagnosis present

## 2015-01-23 DIAGNOSIS — Z880 Allergy status to penicillin: Secondary | ICD-10-CM

## 2015-01-23 DIAGNOSIS — D72829 Elevated white blood cell count, unspecified: Secondary | ICD-10-CM | POA: Diagnosis present

## 2015-01-23 DIAGNOSIS — M109 Gout, unspecified: Secondary | ICD-10-CM | POA: Diagnosis present

## 2015-01-23 DIAGNOSIS — E785 Hyperlipidemia, unspecified: Secondary | ICD-10-CM | POA: Diagnosis present

## 2015-01-23 DIAGNOSIS — Z7901 Long term (current) use of anticoagulants: Secondary | ICD-10-CM | POA: Diagnosis not present

## 2015-01-23 DIAGNOSIS — M160 Bilateral primary osteoarthritis of hip: Secondary | ICD-10-CM | POA: Diagnosis present

## 2015-01-23 DIAGNOSIS — I4891 Unspecified atrial fibrillation: Secondary | ICD-10-CM

## 2015-01-23 DIAGNOSIS — Z79899 Other long term (current) drug therapy: Secondary | ICD-10-CM

## 2015-01-23 DIAGNOSIS — Z87891 Personal history of nicotine dependence: Secondary | ICD-10-CM

## 2015-01-23 DIAGNOSIS — R0602 Shortness of breath: Secondary | ICD-10-CM | POA: Insufficient documentation

## 2015-01-23 DIAGNOSIS — R0600 Dyspnea, unspecified: Secondary | ICD-10-CM | POA: Diagnosis not present

## 2015-01-23 DIAGNOSIS — R918 Other nonspecific abnormal finding of lung field: Secondary | ICD-10-CM | POA: Diagnosis not present

## 2015-01-23 DIAGNOSIS — M159 Polyosteoarthritis, unspecified: Secondary | ICD-10-CM | POA: Diagnosis not present

## 2015-01-23 DIAGNOSIS — R5381 Other malaise: Secondary | ICD-10-CM | POA: Diagnosis not present

## 2015-01-23 DIAGNOSIS — I481 Persistent atrial fibrillation: Principal | ICD-10-CM | POA: Diagnosis present

## 2015-01-23 DIAGNOSIS — E876 Hypokalemia: Secondary | ICD-10-CM | POA: Diagnosis not present

## 2015-01-23 DIAGNOSIS — D500 Iron deficiency anemia secondary to blood loss (chronic): Secondary | ICD-10-CM | POA: Diagnosis present

## 2015-01-23 DIAGNOSIS — I5032 Chronic diastolic (congestive) heart failure: Secondary | ICD-10-CM | POA: Diagnosis not present

## 2015-01-23 DIAGNOSIS — Z96652 Presence of left artificial knee joint: Secondary | ICD-10-CM | POA: Diagnosis not present

## 2015-01-23 DIAGNOSIS — Z96653 Presence of artificial knee joint, bilateral: Secondary | ICD-10-CM | POA: Diagnosis present

## 2015-01-23 DIAGNOSIS — Z79891 Long term (current) use of opiate analgesic: Secondary | ICD-10-CM

## 2015-01-23 DIAGNOSIS — R069 Unspecified abnormalities of breathing: Secondary | ICD-10-CM | POA: Diagnosis not present

## 2015-01-23 DIAGNOSIS — F0390 Unspecified dementia without behavioral disturbance: Secondary | ICD-10-CM | POA: Diagnosis not present

## 2015-01-23 DIAGNOSIS — R05 Cough: Secondary | ICD-10-CM

## 2015-01-23 DIAGNOSIS — I482 Chronic atrial fibrillation: Secondary | ICD-10-CM | POA: Diagnosis present

## 2015-01-23 DIAGNOSIS — D508 Other iron deficiency anemias: Secondary | ICD-10-CM | POA: Diagnosis not present

## 2015-01-23 DIAGNOSIS — Z6833 Body mass index (BMI) 33.0-33.9, adult: Secondary | ICD-10-CM | POA: Diagnosis not present

## 2015-01-23 DIAGNOSIS — I5030 Unspecified diastolic (congestive) heart failure: Secondary | ICD-10-CM | POA: Insufficient documentation

## 2015-01-23 DIAGNOSIS — I959 Hypotension, unspecified: Secondary | ICD-10-CM | POA: Diagnosis present

## 2015-01-23 DIAGNOSIS — Z471 Aftercare following joint replacement surgery: Secondary | ICD-10-CM | POA: Diagnosis not present

## 2015-01-23 DIAGNOSIS — K219 Gastro-esophageal reflux disease without esophagitis: Secondary | ICD-10-CM | POA: Diagnosis present

## 2015-01-23 DIAGNOSIS — Z66 Do not resuscitate: Secondary | ICD-10-CM | POA: Diagnosis present

## 2015-01-23 DIAGNOSIS — M545 Low back pain: Secondary | ICD-10-CM | POA: Diagnosis present

## 2015-01-23 DIAGNOSIS — Z8249 Family history of ischemic heart disease and other diseases of the circulatory system: Secondary | ICD-10-CM | POA: Diagnosis not present

## 2015-01-23 DIAGNOSIS — M179 Osteoarthritis of knee, unspecified: Secondary | ICD-10-CM | POA: Diagnosis present

## 2015-01-23 DIAGNOSIS — I10 Essential (primary) hypertension: Secondary | ICD-10-CM | POA: Diagnosis not present

## 2015-01-23 DIAGNOSIS — M479 Spondylosis, unspecified: Secondary | ICD-10-CM | POA: Diagnosis present

## 2015-01-23 DIAGNOSIS — F0391 Unspecified dementia with behavioral disturbance: Secondary | ICD-10-CM | POA: Diagnosis not present

## 2015-01-23 LAB — URINALYSIS, ROUTINE W REFLEX MICROSCOPIC
Bilirubin Urine: NEGATIVE
GLUCOSE, UA: NEGATIVE mg/dL
Hgb urine dipstick: NEGATIVE
KETONES UR: NEGATIVE mg/dL
LEUKOCYTES UA: NEGATIVE
Nitrite: NEGATIVE
PROTEIN: NEGATIVE mg/dL
Specific Gravity, Urine: 1.04 — ABNORMAL HIGH (ref 1.005–1.030)
UROBILINOGEN UA: 0.2 mg/dL (ref 0.0–1.0)
pH: 5.5 (ref 5.0–8.0)

## 2015-01-23 LAB — CBC
HEMATOCRIT: 35.2 % — AB (ref 39.0–52.0)
HEMOGLOBIN: 11.2 g/dL — AB (ref 13.0–17.0)
MCH: 29.4 pg (ref 26.0–34.0)
MCHC: 31.8 g/dL (ref 30.0–36.0)
MCV: 92.4 fL (ref 78.0–100.0)
Platelets: 192 10*3/uL (ref 150–400)
RBC: 3.81 MIL/uL — AB (ref 4.22–5.81)
RDW: 14.3 % (ref 11.5–15.5)
WBC: 12 10*3/uL — AB (ref 4.0–10.5)

## 2015-01-23 LAB — BASIC METABOLIC PANEL
ANION GAP: 9 (ref 5–15)
BUN: 19 mg/dL (ref 6–20)
CHLORIDE: 110 mmol/L (ref 101–111)
CO2: 25 mmol/L (ref 22–32)
Calcium: 9.2 mg/dL (ref 8.9–10.3)
Creatinine, Ser: 1.09 mg/dL (ref 0.61–1.24)
GFR, EST NON AFRICAN AMERICAN: 60 mL/min — AB (ref >60)
Glucose, Bld: 169 mg/dL — ABNORMAL HIGH (ref 65–99)
POTASSIUM: 3.8 mmol/L (ref 3.5–5.1)
SODIUM: 144 mmol/L (ref 135–145)

## 2015-01-23 LAB — PROTIME-INR
INR: 1.83 — AB (ref 0.00–1.49)
Prothrombin Time: 21.1 seconds — ABNORMAL HIGH (ref 11.6–15.2)

## 2015-01-23 MED ORDER — ACETAMINOPHEN 325 MG PO TABS: 650.0000 mg | ORAL_TABLET | Freq: Four times a day (QID) | ORAL | Status: DC | PRN

## 2015-01-23 MED ORDER — DOCUSATE SODIUM 100 MG PO CAPS
100.0000 mg | ORAL_CAPSULE | Freq: Two times a day (BID) | ORAL | Status: DC
  Administered 2015-01-23 – 2015-01-27 (×7): 100 mg via ORAL
  Filled 2015-01-23 (×8): qty 1

## 2015-01-23 MED ORDER — DILTIAZEM HCL 100 MG IV SOLR
5.0000 mg/h | INTRAVENOUS | Status: DC
  Administered 2015-01-23: 5 mg/h via INTRAVENOUS
  Administered 2015-01-24: 10 mg/h via INTRAVENOUS
  Administered 2015-01-24: 15 mg/h via INTRAVENOUS
  Administered 2015-01-25: 10 mg/h via INTRAVENOUS
  Filled 2015-01-23 (×4): qty 100

## 2015-01-23 MED ORDER — ATENOLOL 25 MG PO TABS
50.0000 mg | ORAL_TABLET | Freq: Two times a day (BID) | ORAL | Status: DC
  Administered 2015-01-23 – 2015-01-27 (×8): 50 mg via ORAL
  Filled 2015-01-23 (×8): qty 2

## 2015-01-23 MED ORDER — IOHEXOL 350 MG/ML SOLN
80.0000 mL | Freq: Once | INTRAVENOUS | Status: AC | PRN
  Administered 2015-01-23: 80 mL via INTRAVENOUS

## 2015-01-23 MED ORDER — FUROSEMIDE 10 MG/ML IJ SOLN
40.0000 mg | Freq: Once | INTRAMUSCULAR | Status: AC
  Administered 2015-01-23: 40 mg via INTRAVENOUS
  Filled 2015-01-23: qty 4

## 2015-01-23 MED ORDER — SODIUM CHLORIDE 0.9 % IV BOLUS (SEPSIS)
500.0000 mL | Freq: Once | INTRAVENOUS | Status: AC
  Administered 2015-01-23: 500 mL via INTRAVENOUS

## 2015-01-23 MED ORDER — ACETAMINOPHEN 650 MG RE SUPP: 650.0000 mg | Freq: Four times a day (QID) | RECTAL | Status: DC | PRN

## 2015-01-23 MED ORDER — RIVAROXABAN 20 MG PO TABS
20.0000 mg | ORAL_TABLET | Freq: Every day | ORAL | Status: DC
  Administered 2015-01-23 – 2015-01-26 (×4): 20 mg via ORAL
  Filled 2015-01-23 (×4): qty 1

## 2015-01-23 MED ORDER — FERROUS SULFATE 325 (65 FE) MG PO TABS
325.0000 mg | ORAL_TABLET | Freq: Three times a day (TID) | ORAL | Status: DC
  Administered 2015-01-24 – 2015-01-27 (×11): 325 mg via ORAL
  Filled 2015-01-23 (×11): qty 1

## 2015-01-23 MED ORDER — PANTOPRAZOLE SODIUM 40 MG PO TBEC
40.0000 mg | DELAYED_RELEASE_TABLET | Freq: Every day | ORAL | Status: DC
  Administered 2015-01-24 – 2015-01-27 (×4): 40 mg via ORAL
  Filled 2015-01-23 (×4): qty 1

## 2015-01-23 MED ORDER — ONDANSETRON HCL 4 MG PO TABS: 4.0000 mg | ORAL_TABLET | Freq: Four times a day (QID) | ORAL | Status: DC | PRN

## 2015-01-23 MED ORDER — TAMSULOSIN HCL 0.4 MG PO CAPS
0.4000 mg | ORAL_CAPSULE | Freq: Every day | ORAL | Status: DC
  Administered 2015-01-24 – 2015-01-26 (×3): 0.4 mg via ORAL
  Filled 2015-01-23 (×4): qty 1

## 2015-01-23 MED ORDER — DILTIAZEM LOAD VIA INFUSION
20.0000 mg | Freq: Once | INTRAVENOUS | Status: AC
  Administered 2015-01-23: 20 mg via INTRAVENOUS
  Filled 2015-01-23: qty 20

## 2015-01-23 MED ORDER — NIACIN ER (ANTIHYPERLIPIDEMIC) 500 MG PO TBCR
1000.0000 mg | EXTENDED_RELEASE_TABLET | Freq: Every day | ORAL | Status: DC
  Administered 2015-01-24: 1000 mg via ORAL
  Filled 2015-01-23 (×5): qty 2

## 2015-01-23 MED ORDER — ALLOPURINOL 300 MG PO TABS
300.0000 mg | ORAL_TABLET | Freq: Every day | ORAL | Status: DC
  Administered 2015-01-24 – 2015-01-27 (×4): 300 mg via ORAL
  Filled 2015-01-23 (×4): qty 1

## 2015-01-23 MED ORDER — OMEGA-3-ACID ETHYL ESTERS 1 G PO CAPS
1000.0000 mg | ORAL_CAPSULE | Freq: Every day | ORAL | Status: DC
  Administered 2015-01-24 – 2015-01-27 (×4): 1000 mg via ORAL
  Filled 2015-01-23 (×5): qty 1

## 2015-01-23 MED ORDER — SODIUM CHLORIDE 0.9 % IJ SOLN
3.0000 mL | Freq: Two times a day (BID) | INTRAMUSCULAR | Status: DC
  Administered 2015-01-24 – 2015-01-27 (×5): 3 mL via INTRAVENOUS

## 2015-01-23 MED ORDER — HYDROCODONE-ACETAMINOPHEN 7.5-325 MG PO TABS
1.0000 | ORAL_TABLET | ORAL | Status: DC | PRN
  Administered 2015-01-23 – 2015-01-25 (×3): 1 via ORAL
  Administered 2015-01-26: 2 via ORAL
  Filled 2015-01-23: qty 1
  Filled 2015-01-23: qty 2
  Filled 2015-01-23 (×2): qty 1

## 2015-01-23 MED ORDER — ONDANSETRON HCL 4 MG/2ML IJ SOLN: 4.0000 mg | Freq: Four times a day (QID) | INTRAMUSCULAR | Status: DC | PRN

## 2015-01-23 MED ORDER — POLYETHYLENE GLYCOL 3350 17 G PO PACK
17.0000 g | PACK | Freq: Two times a day (BID) | ORAL | Status: DC
  Administered 2015-01-24 – 2015-01-25 (×3): 17 g via ORAL
  Filled 2015-01-23 (×5): qty 1

## 2015-01-23 MED ORDER — ATORVASTATIN CALCIUM 40 MG PO TABS
40.0000 mg | ORAL_TABLET | Freq: Every day | ORAL | Status: DC
  Administered 2015-01-24 – 2015-01-27 (×4): 40 mg via ORAL
  Filled 2015-01-23 (×4): qty 1

## 2015-01-23 NOTE — ED Notes (Signed)
Pt. Was seen today at [place] for an appt. Dr. Laurance Flatten reported to family that pt. was feeling bad, HR in 140, and blood pressure was low. He requested transfer by ALS truck. Albuterol 2.5 tx was given in enroute. Pt. BP was 142/104, HR 116, RR 24, and O2 sat at 88%. EMS put the Pt. On 2L [place].

## 2015-01-23 NOTE — Consult Note (Signed)
CARDIOLOGY CONSULT NOTE   Patient ID: Mark Wilkinson MRN: [id number] DOB/AGE: 1928/01/15 81 y.o.  Admit Date: 01/23/2015  Primary Physician: Redge Gainer, MD  Primary Cardiologist    [place]   Clinical Summary Mr. Kittleson is a 81 y.o.male.  He has persistent atrial fibrillation. He was seen recently in the office by Dr. Percival Spanish for cardiac clearance for his left knee replacement. The patient was in atrial fibrillation in the office. He was cleared for his surgery. The plan was outlined for his anticoagulation to be held for the surgery and then restarted afterwards.  The patient underwent recent total knee replacement. His discharge date was January 20, 2015. There is no EKG during that hospitalization. However there are physical therapy notes that outlined that the patient's heart rate went into the range of 130-140 when attempts were made to ambulate him. I suspect that he was having rapid atrial fibrillation at that time. There is mention of some dementia. There was also mention that the patient was having difficulty with ambulation including fatigue at the time of discharge. The family requested that the patient be discharged home by car. He was at home for a few days. He was then brought back into the office of Dr. Morrie Sheldon who has a very nice office note in this record. There was concern about rapid atrial fibrillation. There was also concern about some decrease in blood pressure at that time. The patient has had shortness of breath with exertion. The family also describes wheezing.  In the emergency room it was noted that he had atrial fib with a rapid rate. IV diltiazem was started. The patient was then sent for chest CT angiogram to rule out pulmonary emboli. No pulmonary emboli were present. There was an area of ground glass appearance in the right lung. This was possibly felt to represent mild asymmetric edema, alveolitis, or another possible finding.   Allergies  Allergen  Reactions  . Penicillins Rash    Medications Scheduled Medications:     Infusions: . diltiazem (CARDIZEM) infusion 5 mg/hr (01/23/15 1916)     PRN Medications:     Past Medical History  Diagnosis Date  . Essential hypertension, benign   . Other and unspecified hyperlipidemia   . Diaphragmatic hernia without mention of obstruction or gangrene   . Gout, unspecified   . Calculus of kidney   . Asymptomatic varicose veins   . Lumbago   . PONV (postoperative nausea and vomiting)   . Dementia   . GERD (gastroesophageal reflux disease)   . Arthritis     "knees, hips, back" (03/18/2013)  . Chronic lower back pain   . Atrial fibrillation   . Dysrhythmia     a-fib  . History of hiatal hernia     Past Surgical History  Procedure Laterality Date  . Appendectomy    . Cholecystectomy    . Cystoscopy with retrograde pyelogram, ureteroscopy and stent placement Left 08/13/2012    Procedure: Marceline, URETEROSCOPY AND STENT PLACEMENT;  Surgeon: Alexis Frock, MD;  Location: [facility];  Service: Urology;  Laterality: Left;  . Cataract extraction w/ intraocular lens implant Left 2000's  . Total knee arthroplasty Right 12/08/2012    Procedure: RIGHT TOTAL KNEE ARTHROPLASTY;  Surgeon: [name], MD;  Location: [facility];  Service: Orthopedics;  Laterality: Right;  . Tonsillectomy and adenoidectomy      "no tubes" (03/18/2013)  . Toe amputation Right ?1970's    "shot  his toe off in hunting accident" (03/18/2013)  . Cystoscopy w/ stone manipulation      "twice" (03/18/2013)  . Total knee arthroplasty Left 01/16/2015    Procedure: TOTAL LEFT KNEE ARTHROPLASTY;  Surgeon: Paralee Cancel, MD;  Location: [facility];  Service: Orthopedics;  Laterality: Left;    Family History  Problem Relation Age of Onset  . Heart disease Mother     Died in her 32s  . Hypertension Mother   . Stroke Father   . CAD Brother     MI 67s    Social History Mr.  Bilal reports that he quit smoking about 54 years ago. His smoking use included Cigarettes. He has a 1.2 pack-year smoking history. He has never used smokeless tobacco. Mr. Schreur reports that he does not drink alcohol.  Review of Systems  The review of systems is obtained from the family. The patient repeatedly states that everything is fine. There is no prior evidence of fever, chills, headache, sweats, rash, change in vision, change in hearing, chest pain, nausea or vomiting, urinary symptoms.  Physical Examination Blood pressure 116/84, pulse 107, temperature 98.3 F (36.8 C), temperature source Oral, resp. rate 31, height 6' (1.829 m), weight 212 lb (96.163 kg), SpO2 97 %. No intake or output data in the 24 hours ending 01/23/15 2147  Patient is oriented to person and place. He wants to go home. His family makes it clear that he is not able to do this. Head is atraumatic. The patient is lying flat in bed. His O2 sat monitor intermittently reads as low as 85. Lungs reveal rales in the right lung mid section. Cardiac exam reveals S1 and S2. The abdomen is soft. There is swelling in the area of his left knee. There is no pedal edema.  Prior Cardiac Testing/Procedures  Lab Results  Basic Metabolic Panel:  Recent Labs Lab 01/17/15 0814 01/18/15 0508 01/19/15 0944 01/20/15 0603 01/23/15 1754  NA 139 138 139 142 144  K 4.6 4.3 4.5 5.2* 3.8  CL 104 105 108 109 110  CO2 27 25 24 27 25   GLUCOSE 134* 133* 217* 176* 169*  BUN 23* 34* 38* 27* 19  CREATININE 1.25* 2.01* 1.66* 1.19 1.09  CALCIUM 8.4* 8.1* 8.5* 9.2 9.2    Liver Function Tests: No results for input(s): AST, ALT, ALKPHOS, BILITOT, PROT, ALBUMIN in the last 168 hours.  CBC:  Recent Labs Lab 01/17/15 0515 01/18/15 0508 01/19/15 0944 01/20/15 0603 01/23/15 1754  WBC 13.2* 11.1* 8.9 13.1* 12.0*  HGB 12.9* 11.3* 11.1* 11.3* 11.2*  HCT 38.6* 34.3* 32.9* 34.0* 35.2*  MCV 91.3 91.5 90.4 90.2 92.4  PLT 141* 124* 107*  137* 192    Cardiac Enzymes: No results for input(s): CKTOTAL, CKMB, CKMBINDEX, TROPONINI in the last 168 hours.  BNP: Invalid input(s): POCBNP   Radiology: Dg Chest 2 View  01/23/2015   CLINICAL DATA:  Cough, knee surgery 1 week ago,  EXAM: CHEST  2 VIEW  COMPARISON:  PA and lateral chest x-ray of March 18, 2013  FINDINGS: There is chronic elevation of the right hemidiaphragm. There are coarse lung markings at both lung bases most compatible with atelectasis. No air bronchograms are observed. The heart and pulmonary vascularity are normal. The mediastinum is normal in width. There is multilevel degenerative disc disease of the thoracic spine.  IMPRESSION: Bibasilar subsegmental atelectasis. There is no alveolar pneumonia nor CHF. There is chronic elevation of the right hemidiaphragm.   Electronically Signed   By:  David  Martinique M.D.   On: 01/23/2015 16:13   Ct Angio Chest Pe W/cm &/or Wo Cm  01/23/2015   CLINICAL DATA:  Acute onset of wheezing, coughing and shortness of breath today. Status post total knee arthroplasty 01/16/2015  EXAM: CT ANGIOGRAPHY CHEST WITH CONTRAST  TECHNIQUE: Multidetector CT imaging of the chest was performed using the standard protocol during bolus administration of intravenous contrast. Multiplanar CT image reconstructions and MIPs were obtained to evaluate the vascular anatomy.  CONTRAST:  23mL OMNIPAQUE IOHEXOL 350 MG/ML SOLN  COMPARISON:  None.  FINDINGS: Chest wall: No chest wall mass, supraclavicular or axillary lymphadenopathy. The thyroid gland appears normal. The bony thorax is intact. Moderate degenerative changes involving the thoracic spine with exaggerated thoracic kyphosis.  Mediastinum: The heart is within normal limits in size for age. No pericardial effusion. Mild tortuosity, ectasia and calcification of the thoracic aorta but no focal aneurysm or obvious dissection. No mediastinal or hilar mass or adenopathy. Scattered lymph nodes are noted. The esophagus  is grossly normal.  The pulmonary arterial tree is fairly well opacified. No definite filling defects to suggest pulmonary embolism.  Lungs/ pleura: Patchy ground-glass opacity in the right long is a nonspecific finding but could reflect mild asymmetric edema, bronchiolitis or alveolitis. Patchy areas of subpleural atelectasis are noted. No pulmonary edema or pleural effusion. No focal airspace consolidation to suggest pneumonia.  6 mm nodular density noted in the superior segment of the right lower lobe on image 32. This could be a pulmonary nodule or subpleural atelectasis.  Upper abdomen: There is some reflux of contrast down the IVC which could be due to tricuspid regurgitation. A large duodenum diverticulum is noted. There is a small amount of biliary air likely due to prior cholecystectomy and sphincterotomy. Moderate pancreatic atrophy.  Review of the MIP images confirms the above findings.  IMPRESSION: 1. No CT findings for pulmonary embolism. 2. Scattered aortic calcifications but no aneurysm or dissection. 3. Patchy ground-glass opacity in the right lung is a nonspecific finding and could be due to mild asymmetric pulmonary edema, nonspecific alveolitis or bronchiolitis. No focal airspace consolidation, overt pulmonary edema or pleural effusion. 4. 6 mm nodule in the superior segment of the right lower lobe. If the patient is at high risk for bronchogenic carcinoma, follow-up chest CT at 6-12 months is recommended. If the patient is at low risk for bronchogenic carcinoma, follow-up chest CT at 12 months is recommended. This recommendation follows the consensus statement: Guidelines for Management of Small Pulmonary Nodules Detected on CT Scans: A Statement from the [place] as published in Radiology 2005;237:395-400.   Electronically Signed   By: Marijo Sanes M.D.   On: 01/23/2015 20:11     ECG:  EKG reveals atrial fib with a rapid rate. There are no obvious QRS changes. Prior EKG in July  also showed atrial fib.   Impression and Recommendations    Atrial fibrillation, persistent   Atrial fibrillation with RVR      The patient has persistent atrial fibrillation. We have no EKGs from his recent hospitalization. However I suspect he was in atrial fib during that hospitalization. I also suspect that he had variation in heart       rates that included high rates at times. With admission today his atrial fib rate is elevated. It is not at all clear to me if this is a primary or secondary problem. The plan for now will be to continue IV diltiazem to  provide better heart rate control. His anticoagulation was restarted after his surgery and should be continued.    Status post total knee replacement      The patient went home from the hospital on January 20, 2015. He has been weak since the time of discharge. He is also had significant exertional shortness of breath. It is not clear to me that his current       overall status is secondary to his atrial fibrillation. CT angiogram has ruled out pulmonary emboli. He will need more intensive post surgery rehabilitation.    Shortness of breath   Physical deconditioning      At this point the patient appears to be deconditioned and short of breath after knee surgery. When I first saw him in the emergency room and listened to his lungs, I thought that volume overload could be              playing a role. I was aware that he had just received IV fluids with a question of hypotension. It was after this time that I saw the chest x-ray result and the CT result. We have no definite evidence of marked      fluid     overload. I suspect that some of his wheezing and shortness of breath is a pulmonary phenomenon. Respiratory treatments will probably be helpful.    Dementia     I do not know how significant this problem is.    Daryel November, MD 01/23/2015, 9:47 PM

## 2015-01-23 NOTE — ED Provider Notes (Signed)
CSN: [id number]     Arrival date & time 01/23/15  1738 History   First MD Initiated Contact with Patient 01/23/15 1827     Chief Complaint  Patient presents with  . Shortness of Breath     (Consider location/radiation/quality/duration/timing/severity/associated sxs/prior Treatment) HPI Comments: Pt comes in with cc of palpitations. Pt has hx of persistent afib x 1 year, and a recent knee replacement. Wife reports that pt had a home PT visit, and the therapist noted the HR in the 140s, and a low BP. Pt was advised to go to his pcp, who noted that pt was hypotensive with afib with RVR and advised pt to come to the ER. PT currently has no chest pain , dib - but family reports that he did mention chest pain earlier and does get winded with exertion. No fainting episodes. No recent infection. Pt denies nausea, emesis, fevers, chills, headaches, abdominal pain, uti like symptoms.   ROS 10 Systems reviewed and are negative for acute change except as noted in the HPI.      The history is provided by the patient.    Past Medical History  Diagnosis Date  . Essential hypertension, benign   . Other and unspecified hyperlipidemia   . Diaphragmatic hernia without mention of obstruction or gangrene   . Gout, unspecified   . Calculus of kidney   . Asymptomatic varicose veins   . Lumbago   . PONV (postoperative nausea and vomiting)   . Dementia   . GERD (gastroesophageal reflux disease)   . Arthritis     "knees, hips, back" (03/18/2013)  . Chronic lower back pain   . Atrial fibrillation   . Dysrhythmia     a-fib  . History of hiatal hernia    Past Surgical History  Procedure Laterality Date  . Appendectomy    . Cholecystectomy    . Cystoscopy with retrograde pyelogram, ureteroscopy and stent placement Left 08/13/2012    Procedure: [place], URETEROSCOPY AND STENT PLACEMENT;  Surgeon: Alexis Frock, MD;  Location: [facility];  Service:  Urology;  Laterality: Left;  . Cataract extraction w/ intraocular lens implant Left 2000's  . Total knee arthroplasty Right 12/08/2012    Procedure: RIGHT TOTAL KNEE ARTHROPLASTY;  Surgeon: [name], MD;  Location: [facility];  Service: Orthopedics;  Laterality: Right;  . Tonsillectomy and adenoidectomy      "no tubes" (03/18/2013)  . Toe amputation Right ?1970's    "shot his toe off in hunting accident" (03/18/2013)  . Cystoscopy w/ stone manipulation      "twice" (03/18/2013)  . Total knee arthroplasty Left 01/16/2015    Procedure: TOTAL LEFT KNEE ARTHROPLASTY;  Surgeon: Paralee Cancel, MD;  Location: [facility];  Service: Orthopedics;  Laterality: Left;   Family History  Problem Relation Age of Onset  . Heart disease Mother     Died in her 16s  . Hypertension Mother   . Stroke Father   . CAD Brother     MI 30s   Social History  Substance Use Topics  . Smoking status: Former Smoker -- 0.12 packs/day for 10 years    Types: Cigarettes    Quit date: 12/11/1960  . Smokeless tobacco: Never Used  . Alcohol Use: No    Review of Systems  Cardiovascular: Positive for palpitations.  All other systems reviewed and are negative.     Allergies  Penicillins  Home Medications   Prior to Admission medications   Medication  Sig Start Date End Date Taking? Authorizing Provider  acetaminophen (TYLENOL) 500 MG tablet Take 1 tablet (500 mg total) by mouth every 6 (six) hours as needed. 01/18/15  Yes Danae Orleans, PA-C  allopurinol (ZYLOPRIM) 300 MG tablet TAKE 1 TABLET DAILY 12/19/14  Yes Chipper Herb, MD  atenolol (TENORMIN) 50 MG tablet Take 1 tablet (50 mg total) by mouth 2 (two) times daily. 01/20/15  Yes Matthew Babish, PA-C  atorvastatin (LIPITOR) 40 MG tablet TAKE 1 TABLET BY MOUTH EVERY DAY Patient taking differently: TAKE 1 TABLET BY MOUTH NIGHTLY. 05/16/14  Yes Chipper Herb, MD  B Complex-C (SUPER B COMPLEX PO) Take 1 tablet by mouth at bedtime.    Yes Historical Provider, MD   Cholecalciferol (VITAMIN D3) 1000 UNITS CAPS Take 2,000 Units by mouth every morning.    Yes Historical Provider, MD  docusate sodium (COLACE) 100 MG capsule Take 1 capsule (100 mg total) by mouth 2 (two) times daily. 01/18/15  Yes Danae Orleans, PA-C  esomeprazole (NEXIUM) 40 MG capsule TAKE 1 CAPSULE DAILY 10/12/14  Yes Chipper Herb, MD  ferrous sulfate 325 (65 FE) MG tablet Take 1 tablet (325 mg total) by mouth 3 (three) times daily after meals. 01/18/15  Yes Matthew Babish, PA-C  GLUCOSAMINE PO Take 1 tablet by mouth 2 (two) times daily.   Yes Historical Provider, MD  HYDROcodone-acetaminophen (NORCO) 7.5-325 MG per tablet Take 1-2 tablets by mouth every 4 (four) hours as needed for moderate pain. 01/18/15  Yes Danae Orleans, PA-C  Multiple Vitamin (MULTIVITAMIN WITH MINERALS) TABS Take 1 tablet by mouth every morning.    Yes Historical Provider, MD  niacin (NIASPAN) 500 MG CR tablet TAKE 3 TABLETS BY MOUTH AT BEDTIME Patient taking differently: TAKE 2 TABLETS BY MOUTH AT BEDTIME 11/16/14  Yes Chipper Herb, MD  Omega-3 Fatty Acids (FISH OIL) 1000 MG CAPS Take 1,000 mg by mouth 2 (two) times daily.    Yes Historical Provider, MD  polyethylene glycol (MIRALAX / GLYCOLAX) packet Take 17 g by mouth 2 (two) times daily. 01/18/15  Yes Matthew Babish, PA-C  Ranibizumab (LUCENTIS) 0.3 MG/0.05ML SOLN Place 1 application into the left eye. Injection q 6-8 weeks   Yes Historical Provider, MD  RAPAFLO 8 MG CAPS capsule TAKE 1 CAPSULE DAILY WITH FOOD. Patient taking differently: TAKE 1 CAPSULE BY MOUTH NIGHTLY. 12/16/14  Yes Chipper Herb, MD  rivaroxaban (XARELTO) 20 MG TABS tablet Take 1 tablet (20 mg total) by mouth daily. 01/20/15  Yes Matthew Babish, PA-C   BP 106/92 mmHg  Pulse 105  Temp(Src) 98.8 F (37.1 C) (Oral)  Resp 20  Ht 5\' 9"  (1.753 m)  Wt 222 lb (100.699 kg)  BMI 32.77 kg/m2  SpO2 98% Physical Exam  Constitutional: He is oriented to person, place, and time. He appears  well-developed.  HENT:  Head: Normocephalic and atraumatic.  Eyes: Conjunctivae and EOM are normal. Pupils are equal, round, and reactive to light.  Neck: Normal range of motion. Neck supple.  Cardiovascular: Regular rhythm.   Murmur heard. Tachycardia, irregular rhythm  Pulmonary/Chest: Effort normal and breath sounds normal.  Abdominal: Soft. Bowel sounds are normal. He exhibits no distension. There is no tenderness. There is no rebound and no guarding.  Musculoskeletal: He exhibits no edema or tenderness.  Neurological: He is alert and oriented to person, place, and time.  Skin: Skin is warm.  Nursing note and vitals reviewed.   ED Course  Procedures (including critical care time) Labs  Review Labs Reviewed  BASIC METABOLIC PANEL - Abnormal; Notable for the following:    Glucose, Bld 169 (*)    GFR calc non Af Amer 60 (*)    All other components within normal limits  CBC - Abnormal; Notable for the following:    WBC 12.0 (*)    RBC 3.81 (*)    Hemoglobin 11.2 (*)    HCT 35.2 (*)    All other components within normal limits  PROTIME-INR - Abnormal; Notable for the following:    Prothrombin Time 21.1 (*)    INR 1.83 (*)    All other components within normal limits  URINALYSIS, ROUTINE W REFLEX MICROSCOPIC (NOT AT [facility]) - Abnormal; Notable for the following:    Specific Gravity, Urine 1.040 (*)    All other components within normal limits  TROPONIN I - Abnormal; Notable for the following:    Troponin I 0.04 (*)    All other components within normal limits  BRAIN NATRIURETIC PEPTIDE - Abnormal; Notable for the following:    B Natriuretic Peptide 538.6 (*)    All other components within normal limits  T4, FREE - Abnormal; Notable for the following:    Free T4 1.26 (*)    All other components within normal limits  COMPREHENSIVE METABOLIC PANEL - Abnormal; Notable for the following:    Glucose, Bld 132 (*)    Calcium 8.6 (*)    Total Protein 5.7 (*)    Albumin 2.4 (*)     All other components within normal limits  CBC WITH DIFFERENTIAL/PLATELET - Abnormal; Notable for the following:    WBC 11.3 (*)    RBC 3.29 (*)    Hemoglobin 9.8 (*)    HCT 30.4 (*)    Monocytes Absolute 1.2 (*)    All other components within normal limits  CBC - Abnormal; Notable for the following:    WBC 11.8 (*)    RBC 3.37 (*)    Hemoglobin 9.8 (*)    HCT 31.3 (*)    All other components within normal limits  BASIC METABOLIC PANEL - Abnormal; Notable for the following:    Glucose, Bld 135 (*)    Calcium 8.7 (*)    GFR calc non Af Amer 56 (*)    All other components within normal limits  URINE CULTURE  TSH  T3, FREE    Imaging Review Ct Angio Chest Pe W/cm &/or Wo Cm  01/23/2015   CLINICAL DATA:  Acute onset of wheezing, coughing and shortness of breath today. Status post total knee arthroplasty 01/16/2015  EXAM: CT ANGIOGRAPHY CHEST WITH CONTRAST  TECHNIQUE: Multidetector CT imaging of the chest was performed using the standard protocol during bolus administration of intravenous contrast. Multiplanar CT image reconstructions and MIPs were obtained to evaluate the vascular anatomy.  CONTRAST:  32mL OMNIPAQUE IOHEXOL 350 MG/ML SOLN  COMPARISON:  None.  FINDINGS: Chest wall: No chest wall mass, supraclavicular or axillary lymphadenopathy. The thyroid gland appears normal. The bony thorax is intact. Moderate degenerative changes involving the thoracic spine with exaggerated thoracic kyphosis.  Mediastinum: The heart is within normal limits in size for age. No pericardial effusion. Mild tortuosity, ectasia and calcification of the thoracic aorta but no focal aneurysm or obvious dissection. No mediastinal or hilar mass or adenopathy. Scattered lymph nodes are noted. The esophagus is grossly normal.  The pulmonary arterial tree is fairly well opacified. No definite filling defects to suggest pulmonary embolism.  Lungs/ pleura: Patchy ground-glass opacity in  the right long is a nonspecific  finding but could reflect mild asymmetric edema, bronchiolitis or alveolitis. Patchy areas of subpleural atelectasis are noted. No pulmonary edema or pleural effusion. No focal airspace consolidation to suggest pneumonia.  6 mm nodular density noted in the superior segment of the right lower lobe on image 32. This could be a pulmonary nodule or subpleural atelectasis.  Upper abdomen: There is some reflux of contrast down the IVC which could be due to tricuspid regurgitation. A large duodenum diverticulum is noted. There is a small amount of biliary air likely due to prior cholecystectomy and sphincterotomy. Moderate pancreatic atrophy.  Review of the MIP images confirms the above findings.  IMPRESSION: 1. No CT findings for pulmonary embolism. 2. Scattered aortic calcifications but no aneurysm or dissection. 3. Patchy ground-glass opacity in the right lung is a nonspecific finding and could be due to mild asymmetric pulmonary edema, nonspecific alveolitis or bronchiolitis. No focal airspace consolidation, overt pulmonary edema or pleural effusion. 4. 6 mm nodule in the superior segment of the right lower lobe. If the patient is at high risk for bronchogenic carcinoma, follow-up chest CT at 6-12 months is recommended. If the patient is at low risk for bronchogenic carcinoma, follow-up chest CT at 12 months is recommended. This recommendation follows the consensus statement: Guidelines for Management of Small Pulmonary Nodules Detected on CT Scans: A Statement from the [place] as published in Radiology 2005;237:395-400.   Electronically Signed   By: Marijo Sanes M.D.   On: 01/23/2015 20:11   I have personally reviewed and evaluated these images and lab results as part of my medical decision-making.   EKG Interpretation   Date/Time:  Monday January 23 2015 17:44:03 EDT Ventricular Rate:  140 PR Interval:    QRS Duration: 83 QT Interval:  303 QTC Calculation: 462 R Axis:   48 Text  Interpretation:  Atrial fibrillation with rapid V-rate Borderline low  voltage, extremity leads Minimal ST depression, inferior leads new onset  atrial ficrillation compared to prior ekg Confirmed by Kathrynn Humble, MD, [facility] Comp  [phone]) on 01/23/2015 6:28:22 PM      MDM   Final diagnoses:  Atrial fibrillation with RVR    Pt comes in with afib with RVR. CT Pe ordered - given recent knee replacement, and is neg. The underlying cause for RVR unknown at this time, as labs are also unremarkable. Pt will be started on diltiazem bolus followed by drip. Cards consulted and medicine to admit.  CRITICAL CARE Performed by: Varney Biles   Total critical care time: 37 minutes - afib with RVE requiring iv drip  Critical care time was exclusive of separately billable procedures and treating other patients.  Critical care was necessary to treat or prevent imminent or life-threatening deterioration.  Critical care was time spent personally by me on the following activities: development of treatment plan with patient and/or surrogate as well as nursing, discussions with consultants, evaluation of patient's response to treatment, examination of patient, obtaining history from patient or surrogate, ordering and performing treatments and interventions, ordering and review of laboratory studies, ordering and review of radiographic studies, pulse oximetry and re-evaluation of patient's condition.     Varney Biles, MD 01/25/15 1950

## 2015-01-23 NOTE — Telephone Encounter (Signed)
I received a call from Dr. Laurance Flatten that Mark Wilkinson presented to clinic with atrial fibrillation with RVR.  His heart rate ranged from 120-140s with BP 112/80.  He is feeling fatigued and unable to participate in physical therapy.  He was recently discharged from the hospital after surgery and his atenolol dose was increased.  Given that his anticoagulation was held for surgery, his age and his borderline blood pressure, we cannot safely titrate nodal agents on an outpatient basis. I recommended that Dr. Laurance Flatten refer Mark Wilkinson to the [facility] ED.

## 2015-01-23 NOTE — H&P (Signed)
[facility] History and Physical  Mark Wilkinson OZH:[id number] DOB: 05-12-1928 DOA: 01/23/2015  Referring physician: Dr. Kathrynn Humble. PCP: Redge Gainer, MD  Specialists: Dr. Quay Burow. Cardiologist.  Chief Complaint: Tachycardia.  HPI: Mark Wilkinson is a 81 y.o. male with history of chronic atrial fibrillation, hypertension, hyperlipidemia who has had recent left knee replacement last week and was found to be tachycardic by patient's physical therapist and was taken to patient's PCP. Patient also was found to be mildly hypotensive. Patient was brought to the ER but in the patient's blood pressure was around 629 systolic with A. fib with RVR. Patient was started on Cardizem infusion after Cardizem bolus and cardiology was consulted. CT angiogram of the chest done was showing features concerning for pulmonary edema versus bronchitis. As per the family patient has been feeling short of breath over the last couple of days on exertion. Denies any chest pain fever chills. Patient also was given Lasix 40 mg IV in the ER for possible CHF. Patient denies any nausea vomiting abdominal pain or diarrhea.   Review of Systems: As presented in the history of presenting illness, rest negative.  Past Medical History  Diagnosis Date  . Essential hypertension, benign   . Other and unspecified hyperlipidemia   . Diaphragmatic hernia without mention of obstruction or gangrene   . Gout, unspecified   . Calculus of kidney   . Asymptomatic varicose veins   . Lumbago   . PONV (postoperative nausea and vomiting)   . Dementia   . GERD (gastroesophageal reflux disease)   . Arthritis     "knees, hips, back" (03/18/2013)  . Chronic lower back pain   . Atrial fibrillation   . Dysrhythmia     a-fib  . History of hiatal hernia    Past Surgical History  Procedure Laterality Date  . Appendectomy    . Cholecystectomy    . Cystoscopy with retrograde pyelogram, ureteroscopy and stent placement Left 08/13/2012     Procedure: [place], URETEROSCOPY AND STENT PLACEMENT;  Surgeon: Alexis Frock, MD;  Location: [facility];  Service: Urology;  Laterality: Left;  . Cataract extraction w/ intraocular lens implant Left 2000's  . Total knee arthroplasty Right 12/08/2012    Procedure: RIGHT TOTAL KNEE ARTHROPLASTY;  Surgeon: [name], MD;  Location: [facility];  Service: Orthopedics;  Laterality: Right;  . Tonsillectomy and adenoidectomy      "no tubes" (03/18/2013)  . Toe amputation Right ?1970's    "shot his toe off in hunting accident" (03/18/2013)  . Cystoscopy w/ stone manipulation      "twice" (03/18/2013)  . Total knee arthroplasty Left 01/16/2015    Procedure: TOTAL LEFT KNEE ARTHROPLASTY;  Surgeon: Paralee Cancel, MD;  Location: [facility];  Service: Orthopedics;  Laterality: Left;   Social History:  reports that he quit smoking about 54 years ago. His smoking use included Cigarettes. He has a 1.2 pack-year smoking history. He has never used smokeless tobacco. He reports that he does not drink alcohol or use illicit drugs. Where does patient live home. Can patient participate in ADLs? Yes.  Allergies  Allergen Reactions  . Penicillins Rash    Family History:  Family History  Problem Relation Age of Onset  . Heart disease Mother     Died in her 23s  . Hypertension Mother   . Stroke Father   . CAD Brother     MI 34s      Prior to Admission medications  Medication Sig Start Date End Date Taking? Authorizing Provider  acetaminophen (TYLENOL) 500 MG tablet Take 1 tablet (500 mg total) by mouth every 6 (six) hours as needed. 01/18/15  Yes Danae Orleans, PA-C  allopurinol (ZYLOPRIM) 300 MG tablet TAKE 1 TABLET DAILY 12/19/14  Yes Chipper Herb, MD  atenolol (TENORMIN) 50 MG tablet Take 1 tablet (50 mg total) by mouth 2 (two) times daily. 01/20/15  Yes Matthew Babish, PA-C  atorvastatin (LIPITOR) 40 MG tablet TAKE 1 TABLET BY MOUTH EVERY DAY Patient  taking differently: TAKE 1 TABLET BY MOUTH NIGHTLY. 05/16/14  Yes Chipper Herb, MD  B Complex-C (SUPER B COMPLEX PO) Take 1 tablet by mouth at bedtime.    Yes Historical Provider, MD  Cholecalciferol (VITAMIN D3) 1000 UNITS CAPS Take 2,000 Units by mouth every morning.    Yes Historical Provider, MD  docusate sodium (COLACE) 100 MG capsule Take 1 capsule (100 mg total) by mouth 2 (two) times daily. 01/18/15  Yes Danae Orleans, PA-C  esomeprazole (NEXIUM) 40 MG capsule TAKE 1 CAPSULE DAILY 10/12/14  Yes Chipper Herb, MD  ferrous sulfate 325 (65 FE) MG tablet Take 1 tablet (325 mg total) by mouth 3 (three) times daily after meals. 01/18/15  Yes Matthew Babish, PA-C  GLUCOSAMINE PO Take 1 tablet by mouth 2 (two) times daily.   Yes Historical Provider, MD  HYDROcodone-acetaminophen (NORCO) 7.5-325 MG per tablet Take 1-2 tablets by mouth every 4 (four) hours as needed for moderate pain. 01/18/15  Yes Danae Orleans, PA-C  Multiple Vitamin (MULTIVITAMIN WITH MINERALS) TABS Take 1 tablet by mouth every morning.    Yes Historical Provider, MD  niacin (NIASPAN) 500 MG CR tablet TAKE 3 TABLETS BY MOUTH AT BEDTIME Patient taking differently: TAKE 2 TABLETS BY MOUTH AT BEDTIME 11/16/14  Yes Chipper Herb, MD  Omega-3 Fatty Acids (FISH OIL) 1000 MG CAPS Take 1,000 mg by mouth 2 (two) times daily.    Yes Historical Provider, MD  polyethylene glycol (MIRALAX / GLYCOLAX) packet Take 17 g by mouth 2 (two) times daily. 01/18/15  Yes Matthew Babish, PA-C  Ranibizumab (LUCENTIS) 0.3 MG/0.05ML SOLN Place 1 application into the left eye. Injection q 6-8 weeks   Yes Historical Provider, MD  RAPAFLO 8 MG CAPS capsule TAKE 1 CAPSULE DAILY WITH FOOD. Patient taking differently: TAKE 1 CAPSULE BY MOUTH NIGHTLY. 12/16/14  Yes Chipper Herb, MD  rivaroxaban (XARELTO) 20 MG TABS tablet Take 1 tablet (20 mg total) by mouth daily. 01/20/15  Yes Danae Orleans, PA-C    Physical Exam: Filed Vitals:   01/23/15 2045 01/23/15 2100  01/23/15 2115 01/23/15 2219  BP: 129/85 116/84    Pulse:  104 107   Temp:    98.6 F (37 C)  TempSrc:    Oral  Resp: 25 23 31    Height:    5\' 9"  (1.753 m)  Weight:    100.699 kg (222 lb)  SpO2: 93% 100% 97%      General:  Moderately built and nourished.  Eyes: Anicteric. No pallor.  ENT: No discharge from the ears eyes nose or mouth.  Neck: No mass felt. JVD elevated.  Cardiovascular: S1 and S2 heard.  Respiratory: No rhonchi or crepitations.  Abdomen: Soft nontender bowel sounds present.  Skin: No rash.  Musculoskeletal: Left knee dressing seen.  Psychiatric: Appears normal. Has history of dementia.  Neurologic: Alert awake oriented to time place and person. Moves all extremities.  Labs on Admission:  Basic Metabolic  Panel:  Recent Labs Lab 01/17/15 0814 01/18/15 0508 01/19/15 0944 01/20/15 0603 01/23/15 1754  NA 139 138 139 142 144  K 4.6 4.3 4.5 5.2* 3.8  CL 104 105 108 109 110  CO2 27 25 24 27 25   GLUCOSE 134* 133* 217* 176* 169*  BUN 23* 34* 38* 27* 19  CREATININE 1.25* 2.01* 1.66* 1.19 1.09  CALCIUM 8.4* 8.1* 8.5* 9.2 9.2   Liver Function Tests: No results for input(s): AST, ALT, ALKPHOS, BILITOT, PROT, ALBUMIN in the last 168 hours. No results for input(s): LIPASE, AMYLASE in the last 168 hours. No results for input(s): AMMONIA in the last 168 hours. CBC:  Recent Labs Lab 01/17/15 0515 01/18/15 0508 01/19/15 0944 01/20/15 0603 01/23/15 1754  WBC 13.2* 11.1* 8.9 13.1* 12.0*  HGB 12.9* 11.3* 11.1* 11.3* 11.2*  HCT 38.6* 34.3* 32.9* 34.0* 35.2*  MCV 91.3 91.5 90.4 90.2 92.4  PLT 141* 124* 107* 137* 192   Cardiac Enzymes: No results for input(s): CKTOTAL, CKMB, CKMBINDEX, TROPONINI in the last 168 hours.  BNP (last 3 results) No results for input(s): BNP in the last 8760 hours.  ProBNP (last 3 results) No results for input(s): PROBNP in the last 8760 hours.  CBG: No results for input(s): GLUCAP in the last 168  hours.  Radiological Exams on Admission: Dg Chest 2 View  01/23/2015   CLINICAL DATA:  Cough, knee surgery 1 week ago,  EXAM: CHEST  2 VIEW  COMPARISON:  PA and lateral chest x-ray of March 18, 2013  FINDINGS: There is chronic elevation of the right hemidiaphragm. There are coarse lung markings at both lung bases most compatible with atelectasis. No air bronchograms are observed. The heart and pulmonary vascularity are normal. The mediastinum is normal in width. There is multilevel degenerative disc disease of the thoracic spine.  IMPRESSION: Bibasilar subsegmental atelectasis. There is no alveolar pneumonia nor CHF. There is chronic elevation of the right hemidiaphragm.   Electronically Signed   By: David  Martinique M.D.   On: 01/23/2015 16:13   Ct Angio Chest Pe W/cm &/or Wo Cm  01/23/2015   CLINICAL DATA:  Acute onset of wheezing, coughing and shortness of breath today. Status post total knee arthroplasty 01/16/2015  EXAM: CT ANGIOGRAPHY CHEST WITH CONTRAST  TECHNIQUE: Multidetector CT imaging of the chest was performed using the standard protocol during bolus administration of intravenous contrast. Multiplanar CT image reconstructions and MIPs were obtained to evaluate the vascular anatomy.  CONTRAST:  85mL OMNIPAQUE IOHEXOL 350 MG/ML SOLN  COMPARISON:  None.  FINDINGS: Chest wall: No chest wall mass, supraclavicular or axillary lymphadenopathy. The thyroid gland appears normal. The bony thorax is intact. Moderate degenerative changes involving the thoracic spine with exaggerated thoracic kyphosis.  Mediastinum: The heart is within normal limits in size for age. No pericardial effusion. Mild tortuosity, ectasia and calcification of the thoracic aorta but no focal aneurysm or obvious dissection. No mediastinal or hilar mass or adenopathy. Scattered lymph nodes are noted. The esophagus is grossly normal.  The pulmonary arterial tree is fairly well opacified. No definite filling defects to suggest pulmonary  embolism.  Lungs/ pleura: Patchy ground-glass opacity in the right long is a nonspecific finding but could reflect mild asymmetric edema, bronchiolitis or alveolitis. Patchy areas of subpleural atelectasis are noted. No pulmonary edema or pleural effusion. No focal airspace consolidation to suggest pneumonia.  6 mm nodular density noted in the superior segment of the right lower lobe on image 32. This could be a pulmonary  nodule or subpleural atelectasis.  Upper abdomen: There is some reflux of contrast down the IVC which could be due to tricuspid regurgitation. A large duodenum diverticulum is noted. There is a small amount of biliary air likely due to prior cholecystectomy and sphincterotomy. Moderate pancreatic atrophy.  Review of the MIP images confirms the above findings.  IMPRESSION: 1. No CT findings for pulmonary embolism. 2. Scattered aortic calcifications but no aneurysm or dissection. 3. Patchy ground-glass opacity in the right lung is a nonspecific finding and could be due to mild asymmetric pulmonary edema, nonspecific alveolitis or bronchiolitis. No focal airspace consolidation, overt pulmonary edema or pleural effusion. 4. 6 mm nodule in the superior segment of the right lower lobe. If the patient is at high risk for bronchogenic carcinoma, follow-up chest CT at 6-12 months is recommended. If the patient is at low risk for bronchogenic carcinoma, follow-up chest CT at 12 months is recommended. This recommendation follows the consensus statement: Guidelines for Management of Small Pulmonary Nodules Detected on CT Scans: A Statement from the [place] as published in Radiology 2005;237:395-400.   Electronically Signed   By: Marijo Sanes M.D.   On: 01/23/2015 20:11    EKG: Independently reviewed. A. fib with RVR.  Assessment/Plan Principal Problem:   Atrial fibrillation with RVR Active Problems:   Essential hypertension, benign   S/P left TKA   Physical deconditioning   Dementia    Essential hypertension   Diastolic CHF   1. A. fib with RVR - appreciate cardiology consult. Chads 2 vasc score is 3 and patient is on Apixaban. Patient has been placed on Cardizem infusion of the bolus. Patient also on atenolol which will be continued and we will try to slowly wean off the Cardizem infusion. Patient's atenolol was recently increased during last week following surgery. Check thyroid function test. 2. Shortness of breath most likely from diastolic CHF last year measured in July 2014 was 60-65% with grade 1 diastolic dysfunction - probably worsened because of A. fib with RVR. Patient did receive Lasix 40 mg IV in the ER. Closely follow intake output daily weights metabolic panel and further dose of Lasix to be decided based on response. Check cardiac markers. 3. Hypertension - continue home medications. 4. Recent left total knee replacement - patient was just discharged from rehabilitation last week. May notify patient's orthopedic surgeon Dr. Alvan Dame. 5. Dementia - no acute issues at this time. 6. Hyperlipidemia - continue present medication. 7. Anemia probably from blood loss from recent surgery - follow CBC. 8. History of gout - continue present medications. 9. Mild leukocytosis probably reactionary.  I have reviewed patient's old charts and labs and reviewed cardiology consultand their recommendations. Personally reviewed the patient's chest x-ray and EKG.   DVT Prophylaxis Apixaban. Code Status: DO NOT RESUSCITATE.  Family Communication: Discussed patient's wife and daughter.  Disposition Plan: Admit to inpatient.    KAKRAKANDY,ARSHAD N. [facility] Pager [phone].  If 7PM-7AM, please contact night-coverage [email or web address] Password [email or web address] 01/23/2015, 11:24 PM

## 2015-01-23 NOTE — ED Notes (Signed)
Cardiology at bedside.

## 2015-01-23 NOTE — Progress Notes (Signed)
Subjective:    Patient ID: Mark Wilkinson, male    DOB: [birth year], 81 y.o.   MRN: [id number]  HPI Patient here today for hospital follow up from left knee surgery done at [facility]. He is accompanied today by his wife. The patient is status post left knee arthroplasty on 01/16/2015 at [facility] by Dr. Paralee Cancel. He has had increased coughing and wheezing. He has not been running any fever. He has been home from the hospital for 3 days. His wife says she has help at home for 4 hours a day. He responds appropriately to questions asked of him but looks like he is feeling very poorly. He has not been able to do as much physical therapy as his wife feels like he needs to be doing because he is so weak.      Patient Active Problem List   Diagnosis Date Noted  . S/P left TKA 01/16/2015  . Atrial fibrillation, persistent 02/25/2014  . Metabolic syndrome 46/96/2952  . BPH (benign prostatic hyperplasia) 06/16/2013  . Chest pain 03/18/2013  . Acute encephalopathy 12/11/2012  . Hypotension, unspecified 12/11/2012  . Elevated troponin 12/11/2012  . Expected blood loss anemia 12/09/2012  . Obese 12/09/2012  . S/P right TKA 12/08/2012  . Preop cardiovascular exam 11/25/2012  . Essential hypertension, benign   . Other and unspecified hyperlipidemia   . Diaphragmatic hernia without mention of obstruction or gangrene   . Gout, unspecified   . Calculus of kidney   . [place]   . Asymptomatic varicose veins   . Lumbago    Outpatient Encounter Prescriptions as of 01/23/2015  Medication Sig  . acetaminophen (TYLENOL) 500 MG tablet Take 1 tablet (500 mg total) by mouth every 6 (six) hours as needed.  Marland Kitchen allopurinol (ZYLOPRIM) 300 MG tablet TAKE 1 TABLET DAILY  . atenolol (TENORMIN) 50 MG tablet Take 1 tablet (50 mg total) by mouth 2 (two) times daily.  Marland Kitchen atorvastatin (LIPITOR) 40 MG tablet TAKE 1 TABLET BY MOUTH EVERY DAY (Patient taking differently: TAKE 1 TABLET BY MOUTH  NIGHTLY.)  . B Complex-C (SUPER B COMPLEX PO) Take 1 tablet by mouth at bedtime.   . Cholecalciferol (VITAMIN D3) 1000 UNITS CAPS Take 2,000 Units by mouth every morning.   . docusate sodium (COLACE) 100 MG capsule Take 1 capsule (100 mg total) by mouth 2 (two) times daily.  Marland Kitchen esomeprazole (NEXIUM) 40 MG capsule TAKE 1 CAPSULE DAILY  . ferrous sulfate 325 (65 FE) MG tablet Take 1 tablet (325 mg total) by mouth 3 (three) times daily after meals.  Marland Kitchen GLUCOSAMINE PO Take 1 tablet by mouth 2 (two) times daily.  Marland Kitchen HYDROcodone-acetaminophen (NORCO) 7.5-325 MG per tablet Take 1-2 tablets by mouth every 4 (four) hours as needed for moderate pain.  . Multiple Vitamin (MULTIVITAMIN WITH MINERALS) TABS Take 1 tablet by mouth every morning.   . niacin (NIASPAN) 500 MG CR tablet TAKE 3 TABLETS BY MOUTH AT BEDTIME (Patient taking differently: TAKE 2 TABLETS BY MOUTH AT BEDTIME)  . Omega-3 Fatty Acids (FISH OIL) 1000 MG CAPS Take 1,000 mg by mouth 2 (two) times daily.   . polyethylene glycol (MIRALAX / GLYCOLAX) packet Take 17 g by mouth 2 (two) times daily.  . Ranibizumab (LUCENTIS) 0.3 MG/0.05ML SOLN Place 1 application into the left eye. Injection q 6-8 weeks  . RAPAFLO 8 MG CAPS capsule TAKE 1 CAPSULE DAILY WITH FOOD. (Patient taking differently: TAKE 1 CAPSULE BY MOUTH NIGHTLY.)  .  rivaroxaban (XARELTO) 20 MG TABS tablet Take 1 tablet (20 mg total) by mouth daily.  . [DISCONTINUED] methocarbamol (ROBAXIN) 500 MG tablet Take 1 tablet (500 mg total) by mouth every 6 (six) hours as needed for muscle spasms.   No facility-administered encounter medications on file as of 01/23/2015.       Review of Systems  Constitutional: Negative.   HENT: Negative.   Eyes: Negative.   Respiratory: Positive for cough and wheezing.   Cardiovascular: Negative.   Gastrointestinal: Negative.   Endocrine: Negative.   Genitourinary: Negative.   Musculoskeletal: Positive for arthralgias (knee pain).  Skin: Negative.     Allergic/Immunologic: Negative.   Neurological: Negative.   Hematological: Negative.   Psychiatric/Behavioral: Negative.        Objective:   Physical Exam  Constitutional: He is oriented to person, place, and time. He appears well-developed and well-nourished. He appears distressed.  HENT:  Head: Normocephalic and atraumatic.  Right Ear: External ear normal.  Left Ear: External ear normal.  Mouth/Throat: Oropharynx is clear and moist. No oropharyngeal exudate.  Nasal congestion right greater than left  Eyes: Conjunctivae and EOM are normal. Pupils are equal, round, and reactive to light. Right eye exhibits no discharge. Left eye exhibits no discharge. No scleral icterus.  Neck: Normal range of motion. Neck supple. No thyromegaly present.  Cardiovascular: Exam reveals no gallop and no friction rub.   No murmur heard. The heart is irregular irregular anywhere from 120-144/m  Pulmonary/Chest: Effort normal. No respiratory distress. He has no wheezes. He has no rales. He exhibits no tenderness.  There are minimal wheezes but a somewhat tight cough bilaterally  Abdominal: Soft. Bowel sounds are normal. He exhibits no mass. There is no tenderness. There is no rebound and no guarding.  Musculoskeletal: He exhibits edema. He exhibits no tenderness.  The patient is using her rolling walker and there is edema and bruising of the left knee both above and below the knee was some rubor.  Lymphadenopathy:    He has no cervical adenopathy.  Neurological: He is alert and oriented to person, place, and time.  Skin: Skin is warm and dry. No rash noted.  Psychiatric: He has a normal mood and affect. His behavior is normal. Judgment and thought content normal.  Nursing note and vitals reviewed.  BP 112/80 mmHg  Pulse 89  Temp(Src) 97.1 F (36.2 C) (Oral)  Ht $R'5\' 8"'px$  (1.727 m)  Wt 212 lb (96.163 kg)  BMI 32.24 kg/m2  The pulse ox is 96%. The hemoglobin in office was 10.8.----The CBC and BMP are  pending. [facility] reading (PRIMARY) by  Dr. Brunilda Payor x-ray-  atelectasis                                      Assessment & Plan:  1. Hospital discharge follow-up - DG Chest 2 View; Future - BMP8+EGFR - CBC with Differential/Platelet  2. Cough -Chest x-ray was read by the radiologist as atelectasis. - DG Chest 2 View; Future - BMP8+EGFR - CBC with Differential/Platelet  3. Shortness of breath -The pulse ox was 96%.  4. Atrial fibrillation, unspecified -Patient had a heart rate of 120-150 while in the office. -Cardiologist was called and consulted regarding the patient's low blood pressure and fast heart rate and felt that it would be better for the patient to have him readmitted to the hospital to have his rate regulated with medication  under supervision  5. Status post left knee replacement -Physical therapy and follow-up with Dr. Alvan Dame as planned  6. Other iron deficiency anemias -Hemoglobin today was 10.8  Patient Instructions  I explained to the patient and his wife after speaking with the cardiologist that it is felt like it would be important for him to come back to the hospital to get his rate regulated so he would not be so lightheaded and weak They both understand this. EMS was called to take the patient to the hospital where they will have the ER physician contacted cardiology once he is there The chest x-ray was read by the radiologist as atelectasis The patient's hemoglobin was 10.8   Arrie Senate MD

## 2015-01-23 NOTE — Patient Instructions (Signed)
I explained to the patient and his wife after speaking with the cardiologist that it is felt like it would be important for him to come back to the hospital to get his rate regulated so he would not be so lightheaded and weak They both understand this. EMS was called to take the patient to the hospital where they will have the ER physician contacted cardiology once he is there The chest x-ray was read by the radiologist as atelectasis The patient's hemoglobin was 10.8

## 2015-01-23 NOTE — Telephone Encounter (Signed)
Needs to speak with the DOD regarding this patient.

## 2015-01-24 DIAGNOSIS — I4891 Unspecified atrial fibrillation: Secondary | ICD-10-CM

## 2015-01-24 DIAGNOSIS — I5032 Chronic diastolic (congestive) heart failure: Secondary | ICD-10-CM

## 2015-01-24 DIAGNOSIS — F0391 Unspecified dementia with behavioral disturbance: Secondary | ICD-10-CM

## 2015-01-24 LAB — COMPREHENSIVE METABOLIC PANEL
ALBUMIN: 2.4 g/dL — AB (ref 3.5–5.0)
ALT: 17 U/L (ref 17–63)
AST: 24 U/L (ref 15–41)
Alkaline Phosphatase: 50 U/L (ref 38–126)
Anion gap: 8 (ref 5–15)
BILIRUBIN TOTAL: 1.2 mg/dL (ref 0.3–1.2)
BUN: 16 mg/dL (ref 6–20)
CO2: 27 mmol/L (ref 22–32)
CREATININE: 0.98 mg/dL (ref 0.61–1.24)
Calcium: 8.6 mg/dL — ABNORMAL LOW (ref 8.9–10.3)
Chloride: 105 mmol/L (ref 101–111)
GFR calc Af Amer: >60 mL/min (ref >60)
GLUCOSE: 132 mg/dL — AB (ref 65–99)
POTASSIUM: 3.5 mmol/L (ref 3.5–5.1)
Sodium: 140 mmol/L (ref 135–145)
TOTAL PROTEIN: 5.7 g/dL — AB (ref 6.5–8.1)

## 2015-01-24 LAB — CBC WITH DIFFERENTIAL/PLATELET
BASOS ABS: 0 10*3/uL (ref 0.0–0.2)
Basophils Absolute: 0 10*3/uL (ref 0.0–0.1)
Basophils Relative: 0 % (ref 0–1)
Basos: 0 %
EOS (ABSOLUTE): 0.2 10*3/uL (ref 0.0–0.4)
EOS ABS: 0.3 10*3/uL (ref 0.0–0.7)
EOS PCT: 3 % (ref 0–5)
EOS: 1 %
HCT: 30.4 % — ABNORMAL LOW (ref 39.0–52.0)
HEMATOCRIT: 31.5 % — AB (ref 37.5–51.0)
HEMOGLOBIN: 10.7 g/dL — AB (ref 12.6–17.7)
Hemoglobin: 9.8 g/dL — ABNORMAL LOW (ref 13.0–17.0)
Immature Grans (Abs): 0 10*3/uL (ref 0.0–0.1)
Immature Granulocytes: 0 %
LYMPHS ABS: 2.1 10*3/uL (ref 0.7–3.1)
LYMPHS ABS: 2.5 10*3/uL (ref 0.7–4.0)
LYMPHS PCT: 22 % (ref 12–46)
Lymphs: 18 %
MCH: 29.8 pg (ref 26.6–33.0)
MCH: 29.8 pg (ref 26.0–34.0)
MCHC: 34 g/dL (ref 31.5–35.7)
MCHC: 32.2 g/dL (ref 30.0–36.0)
MCV: 88 fL (ref 79–97)
MCV: 92.4 fL (ref 78.0–100.0)
MONO ABS: 1.2 10*3/uL — AB (ref 0.1–1.0)
MONOCYTES: 9 %
Monocytes Absolute: 1 10*3/uL — ABNORMAL HIGH (ref 0.1–0.9)
Monocytes Relative: 10 % (ref 3–12)
NEUTROS ABS: 8.1 10*3/uL — AB (ref 1.4–7.0)
Neutro Abs: 7.4 10*3/uL (ref 1.7–7.7)
Neutrophils Relative %: 65 % (ref 43–77)
Neutrophils: 72 %
PLATELETS: 200 10*3/uL (ref 150–400)
Platelets: 217 10*3/uL (ref 150–379)
RBC: 3.59 x10E6/uL — ABNORMAL LOW (ref 4.14–5.80)
RBC: 3.29 MIL/uL — ABNORMAL LOW (ref 4.22–5.81)
RDW: 13.9 % (ref 12.3–15.4)
RDW: 14.5 % (ref 11.5–15.5)
WBC: 11.5 10*3/uL — ABNORMAL HIGH (ref 3.4–10.8)
WBC: 11.3 10*3/uL — AB (ref 4.0–10.5)

## 2015-01-24 LAB — BMP8+EGFR
BUN / CREAT RATIO: 17 (ref 10–22)
BUN: 19 mg/dL (ref 8–27)
CHLORIDE: 105 mmol/L (ref 97–108)
CO2: 22 mmol/L (ref 18–29)
Calcium: 9.2 mg/dL (ref 8.6–10.2)
Creatinine, Ser: 1.11 mg/dL (ref 0.76–1.27)
GFR calc non Af Amer: 60 mL/min/{1.73_m2} (ref >59)
GFR, EST AFRICAN AMERICAN: 70 mL/min/{1.73_m2} (ref >59)
Glucose: 169 mg/dL — ABNORMAL HIGH (ref 65–99)
Potassium: 4.5 mmol/L (ref 3.5–5.2)
SODIUM: 145 mmol/L — AB (ref 134–144)

## 2015-01-24 LAB — T4, FREE: Free T4: 1.26 ng/dL — ABNORMAL HIGH (ref 0.61–1.12)

## 2015-01-24 LAB — BRAIN NATRIURETIC PEPTIDE: B NATRIURETIC PEPTIDE 5: 538.6 pg/mL — AB (ref 0.0–100.0)

## 2015-01-24 LAB — TROPONIN I: Troponin I: 0.04 ng/mL — ABNORMAL HIGH (ref <0.031)

## 2015-01-24 LAB — TSH: TSH: 1.338 u[IU]/mL (ref 0.350–4.500)

## 2015-01-24 MED ORDER — ALPRAZOLAM 0.25 MG PO TABS
0.2500 mg | ORAL_TABLET | Freq: Once | ORAL | Status: AC
  Administered 2015-01-24: 0.25 mg via ORAL
  Filled 2015-01-24: qty 1

## 2015-01-24 MED ORDER — FUROSEMIDE 10 MG/ML IJ SOLN
20.0000 mg | Freq: Two times a day (BID) | INTRAMUSCULAR | Status: DC
  Administered 2015-01-24: 20 mg via INTRAVENOUS
  Filled 2015-01-24: qty 2

## 2015-01-24 MED ORDER — HALOPERIDOL LACTATE 5 MG/ML IJ SOLN
1.0000 mg | Freq: Four times a day (QID) | INTRAMUSCULAR | Status: AC | PRN
  Administered 2015-01-24 – 2015-01-25 (×2): 1 mg via INTRAVENOUS
  Filled 2015-01-24 (×2): qty 1

## 2015-01-24 MED ORDER — HALOPERIDOL LACTATE 5 MG/ML IJ SOLN: 2.0000 mg | Freq: Once | INTRAMUSCULAR | Status: DC

## 2015-01-24 MED ORDER — POTASSIUM CHLORIDE CRYS ER 20 MEQ PO TBCR
40.0000 meq | EXTENDED_RELEASE_TABLET | Freq: Two times a day (BID) | ORAL | Status: DC
  Administered 2015-01-24 – 2015-01-27 (×5): 40 meq via ORAL
  Filled 2015-01-24 (×6): qty 2

## 2015-01-24 MED ORDER — FUROSEMIDE 10 MG/ML IJ SOLN
40.0000 mg | Freq: Two times a day (BID) | INTRAMUSCULAR | Status: DC
  Administered 2015-01-24 – 2015-01-26 (×4): 40 mg via INTRAVENOUS
  Filled 2015-01-24 (×4): qty 4

## 2015-01-24 NOTE — Progress Notes (Addendum)
[facility] PROGRESS NOTE  Mark Wilkinson XTG:[id number] DOB: 1928-05-12 DOA: 01/23/2015 PCP: Redge Gainer, MD   HPI: Mark Wilkinson is a 81 y.o. male with history of chronic atrial fibrillation, hypertension, hyperlipidemia who has had recent left knee replacement last week and was found to be tachycardic by patient's physical therapist and was taken to patient's PCP. Patient also was found to be mildly hypotensive. Patient was brought to the ER but in the patient's blood pressure was around 270 systolic with A. fib with RVR. Patient was started on Cardizem infusion after Cardizem bolus and cardiology was consulted. CT angiogram of the chest done was showing features concerning for pulmonary edema versus bronchitis.  Assessment/Plan: 1. A. fib with RVR -continue Cardizem gtt, increase rate, HR in 90s-120s -CTA negative for PE -Chads 2 vasc score is 3, continue Xarelto -continue atenolol -TSH WNL -volume overloaded too expect rate to improve with diuresis -Cards following  2. Acute on Chronic Diastolic CHF -ECHO 3/50 EF was 60-65% with grade 1 diastolic dysfunction -IV lasix 40mg  q12 -monitor I/Os, weights  3. Hypertension  - continue atenolol  4. Recent left total knee replacement  - patient was just discharged from rehab last week, OR on 8/22  5. Dementia  - stable and pleasantly confused like baseline  6. Anemia probably from blood loss from recent surgery, and volume overloaded state  -monitor with diuresis  7. History of gout  - continue allopurinol  8. Mild leukocytosis -likely reactive, monitor   DVT proph: on Xarelto  Code Status: DNR Family Communication: wife and daughter at bedside Disposition Plan: Home when improved   Consultants: Cards  HPI/Subjective: No complaints  Objective: Filed Vitals:   01/24/15 0721  BP:   Pulse:   Temp: 98.1 F (36.7 C)  Resp:     Intake/Output Summary (Last 24 hours) at 01/24/15 1121 Last data filed at 01/24/15  1054  Gross per 24 hour  Intake    120 ml  Output   1650 ml  Net  -1530 ml   Filed Weights   01/23/15 1751 01/23/15 2219 01/24/15 0400  Weight: 96.163 kg (212 lb) 100.699 kg (222 lb) 100.517 kg (221 lb 9.6 oz)    Exam:   General:  Alert, awake, oriented to self, only, pleasantly confused  Cardiovascular: S1S2/RRR  Respiratory: wheezing and basilar crackles  Abdomen: soft, NT, BS present  Musculoskeletal: 1 plus edema, L knee with some swelling and bruising around it  Data Reviewed: Basic Metabolic Panel:  Recent Labs Lab 01/19/15 0944 01/20/15 0603 01/23/15 1545 01/23/15 1754 01/24/15 0527  NA 139 142 145* 144 140  K 4.5 5.2* 4.5 3.8 3.5  CL 108 109 105 110 105  CO2 24 27 22 25 27   GLUCOSE 217* 176* 169* 169* 132*  BUN 38* 27* 19 19 16   CREATININE 1.66* 1.19 1.11 1.09 0.98  CALCIUM 8.5* 9.2 9.2 9.2 8.6*   Liver Function Tests:  Recent Labs Lab 01/24/15 0527  AST 24  ALT 17  ALKPHOS 50  BILITOT 1.2  PROT 5.7*  ALBUMIN 2.4*   No results for input(s): LIPASE, AMYLASE in the last 168 hours. No results for input(s): AMMONIA in the last 168 hours. CBC:  Recent Labs Lab 01/18/15 0508 01/19/15 0944 01/20/15 0603 01/23/15 1545 01/23/15 1754 01/24/15 0527  WBC 11.1* 8.9 13.1* 11.5* 12.0* 11.3*  NEUTROABS  --   --   --  8.1*  --  7.4  HGB 11.3* 11.1* 11.3*  --  11.2* 9.8*  HCT 34.3* 32.9* 34.0* 31.5* 35.2* 30.4*  MCV 91.5 90.4 90.2  --  92.4 92.4  PLT 124* 107* 137*  --  192 200   Cardiac Enzymes:  Recent Labs Lab 01/24/15 0003  TROPONINI 0.04*   BNP (last 3 results)  Recent Labs  01/24/15 0003  BNP 538.6*    ProBNP (last 3 results) No results for input(s): PROBNP in the last 8760 hours.  CBG: No results for input(s): GLUCAP in the last 168 hours.  Recent Results (from the past 240 hour(s))  Urine culture     Status: None (Preliminary result)   Collection Time: 01/23/15  8:35 PM  Result Value Ref Range Status   Specimen  Description URINE, RANDOM  Final   Special Requests NONE  Final   Culture NO GROWTH < 12 HOURS  Final   Report Status PENDING  Incomplete     Studies: Dg Chest 2 View  01/23/2015   CLINICAL DATA:  Cough, knee surgery 1 week ago,  EXAM: CHEST  2 VIEW  COMPARISON:  PA and lateral chest x-ray of March 18, 2013  FINDINGS: There is chronic elevation of the right hemidiaphragm. There are coarse lung markings at both lung bases most compatible with atelectasis. No air bronchograms are observed. The heart and pulmonary vascularity are normal. The mediastinum is normal in width. There is multilevel degenerative disc disease of the thoracic spine.  IMPRESSION: Bibasilar subsegmental atelectasis. There is no alveolar pneumonia nor CHF. There is chronic elevation of the right hemidiaphragm.   Electronically Signed   By: David  Martinique M.D.   On: 01/23/2015 16:13   Ct Angio Chest Pe W/cm &/or Wo Cm  01/23/2015   CLINICAL DATA:  Acute onset of wheezing, coughing and shortness of breath today. Status post total knee arthroplasty 01/16/2015  EXAM: CT ANGIOGRAPHY CHEST WITH CONTRAST  TECHNIQUE: Multidetector CT imaging of the chest was performed using the standard protocol during bolus administration of intravenous contrast. Multiplanar CT image reconstructions and MIPs were obtained to evaluate the vascular anatomy.  CONTRAST:  75mL OMNIPAQUE IOHEXOL 350 MG/ML SOLN  COMPARISON:  None.  FINDINGS: Chest wall: No chest wall mass, supraclavicular or axillary lymphadenopathy. The thyroid gland appears normal. The bony thorax is intact. Moderate degenerative changes involving the thoracic spine with exaggerated thoracic kyphosis.  Mediastinum: The heart is within normal limits in size for age. No pericardial effusion. Mild tortuosity, ectasia and calcification of the thoracic aorta but no focal aneurysm or obvious dissection. No mediastinal or hilar mass or adenopathy. Scattered lymph nodes are noted. The esophagus is grossly  normal.  The pulmonary arterial tree is fairly well opacified. No definite filling defects to suggest pulmonary embolism.  Lungs/ pleura: Patchy ground-glass opacity in the right long is a nonspecific finding but could reflect mild asymmetric edema, bronchiolitis or alveolitis. Patchy areas of subpleural atelectasis are noted. No pulmonary edema or pleural effusion. No focal airspace consolidation to suggest pneumonia.  6 mm nodular density noted in the superior segment of the right lower lobe on image 32. This could be a pulmonary nodule or subpleural atelectasis.  Upper abdomen: There is some reflux of contrast down the IVC which could be due to tricuspid regurgitation. A large duodenum diverticulum is noted. There is a small amount of biliary air likely due to prior cholecystectomy and sphincterotomy. Moderate pancreatic atrophy.  Review of the MIP images confirms the above findings.  IMPRESSION: 1. No CT findings for pulmonary embolism. 2. Scattered aortic  calcifications but no aneurysm or dissection. 3. Patchy ground-glass opacity in the right lung is a nonspecific finding and could be due to mild asymmetric pulmonary edema, nonspecific alveolitis or bronchiolitis. No focal airspace consolidation, overt pulmonary edema or pleural effusion. 4. 6 mm nodule in the superior segment of the right lower lobe. If the patient is at high risk for bronchogenic carcinoma, follow-up chest CT at 6-12 months is recommended. If the patient is at low risk for bronchogenic carcinoma, follow-up chest CT at 12 months is recommended. This recommendation follows the consensus statement: Guidelines for Management of Small Pulmonary Nodules Detected on CT Scans: A Statement from the [place] as published in Radiology 2005;237:395-400.   Electronically Signed   By: Marijo Sanes M.D.   On: 01/23/2015 20:11    Scheduled Meds: . allopurinol  300 mg Oral Daily  . atenolol  50 mg Oral BID  . atorvastatin  40 mg Oral Daily   . docusate sodium  100 mg Oral BID  . ferrous sulfate  325 mg Oral TID PC  . furosemide  40 mg Intravenous BID  . niacin  1,000 mg Oral QHS  . omega-3 acid ethyl esters  1,000 mg Oral Daily  . pantoprazole  40 mg Oral Daily  . polyethylene glycol  17 g Oral BID  . potassium chloride  40 mEq Oral BID  . rivaroxaban  20 mg Oral q1800  . sodium chloride  3 mL Intravenous Q12H  . tamsulosin  0.4 mg Oral QPC supper   Continuous Infusions: . diltiazem (CARDIZEM) infusion 7.5 mg/hr (01/24/15 1000)   Antibiotics Given (last 72 hours)    None      Principal Problem:   Atrial fibrillation with RVR Active Problems:   Essential hypertension, benign   Obese   S/P left TKA   Physical deconditioning   Dementia   Chronic diastolic CHF (congestive heart failure)    Time spent: 48min    Cayleb Jarnigan  [facility] Pager [phone]. If 7PM-7AM, please contact night-coverage at [email or web address], password [facility] 01/24/2015, 11:21 AM  LOS: 1 day

## 2015-01-24 NOTE — Progress Notes (Signed)
UR Completed Lore Polka Graves-Bigelow, RN,BSN [phone]  

## 2015-01-24 NOTE — Care Management Note (Addendum)
Case Management Note  Patient Details  Name: YANG RACK MRN: [id number] Date of Birth: 04/14/1928  Subjective/Objective:  Pt recent d/c. Had left knee replacement last week and was found to be tachycardic by patient's physical therapist and was taken to patient's PCP. Pt is active with [facility].  Admitted for A Fib with RVR. Patient was started on Cardizem IV.                  Action/Plan: CM did make [facility] aware that pt is hospitalized. PT/OT to reevaluate to see if pt will need increased level of care like SNF.  CM will continue to monitor for additional needs.    Expected Discharge Date:                  Expected Discharge Plan:  [place]- may benefit from SNF once stable.   In-House Referral:  NA  Discharge planning Services  CM Consult  Post Acute Care Choice:  Resumption of Svcs/PTA Provider, Home Health Choice offered to:     DME Arranged:  N/A DME Agency:  NA  HH Arranged:  PT HH Agency:  [facility]  Status of Service:  In process, will continue to follow  Medicare Important Message Given:    Date Medicare IM Given:    Medicare IM give by:    Date Additional Medicare IM Given:    Additional Medicare Important Message give by:     If discussed at [place] of Stay Meetings, dates discussed:    Additional Comments:  Bethena Roys, RN 01/24/2015, 10:52 AM

## 2015-01-24 NOTE — Discharge Instructions (Signed)
Information on my medicine - XARELTO (Rivaroxaban)  This medication education was reviewed with me or my healthcare representative as part of my discharge preparation.  The pharmacist that spoke with me during my hospital stay was:  Romona Curls, [facility]  Why was Xarelto prescribed for you? Xarelto was prescribed for you to reduce the risk of a blood clot forming that can cause a stroke if you have a medical condition called atrial fibrillation (a type of irregular heartbeat).  What do you need to know about xarelto ? Take your Xarelto ONCE DAILY at the same time every day with your evening meal. If you have difficulty swallowing the tablet whole, you may crush it and mix in applesauce just prior to taking your dose.  Take Xarelto exactly as prescribed by your doctor and DO NOT stop taking Xarelto without talking to the doctor who prescribed the medication.  Stopping without other stroke prevention medication to take the place of Xarelto may increase your risk of developing a clot that causes a stroke.  Refill your prescription before you run out.  After discharge, you should have regular check-up appointments with your healthcare provider that is prescribing your Xarelto.  In the future your dose may need to be changed if your kidney function or weight changes by a significant amount.  What do you do if you miss a dose? If you are taking Xarelto ONCE DAILY and you miss a dose, take it as soon as you remember on the same day then continue your regularly scheduled once daily regimen the next day. Do not take two doses of Xarelto at the same time or on the same day.   Important Safety Information A possible side effect of Xarelto is bleeding. You should call your healthcare provider right away if you experience any of the following: ? Bleeding from an injury or your nose that does not stop. ? Unusual colored urine (red or dark brown) or unusual colored stools (red or black). ? Unusual  bruising for unknown reasons. ? A serious fall or if you hit your head (even if there is no bleeding).  Some medicines may interact with Xarelto and might increase your risk of bleeding while on Xarelto. To help avoid this, consult your healthcare provider or pharmacist prior to using any new prescription or non-prescription medications, including herbals, vitamins, non-steroidal anti-inflammatory drugs (NSAIDs) and supplements.  This website has more information on Xarelto: [email or web address]

## 2015-01-24 NOTE — Progress Notes (Signed)
Patient Name: Mark Wilkinson Date of Encounter: 01/24/2015     Principal Problem:   Atrial fibrillation with RVR Active Problems:   S/P left TKA   Physical deconditioning   Essential hypertension, benign   Obese   Chronic diastolic CHF (congestive heart failure)   Dementia    SUBJECTIVE  Denies c/p, sob, or palpitations but can be heard wheezing.  Speech is somewhat labored.  Wife says that he's been short of breath with minimal activity @ home.    CURRENT MEDS . allopurinol  300 mg Oral Daily  . atenolol  50 mg Oral BID  . atorvastatin  40 mg Oral Daily  . docusate sodium  100 mg Oral BID  . ferrous sulfate  325 mg Oral TID PC  . furosemide  20 mg Intravenous Q12H  . niacin  1,000 mg Oral QHS  . omega-3 acid ethyl esters  1,000 mg Oral Daily  . pantoprazole  40 mg Oral Daily  . polyethylene glycol  17 g Oral BID  . potassium chloride  40 mEq Oral BID  . rivaroxaban  20 mg Oral q1800  . sodium chloride  3 mL Intravenous Q12H  . tamsulosin  0.4 mg Oral QPC supper    OBJECTIVE  Filed Vitals:   01/24/15 0400 01/24/15 0404 01/24/15 0718 01/24/15 0721  BP:  96/66 111/75   Pulse:  111 103   Temp: 98.4 F (36.9 C)   98.1 F (36.7 C)  TempSrc: Oral   Oral  Resp:  22 22   Height:      Weight: 221 lb 9.6 oz (100.517 kg)     SpO2:  98% 97%     Intake/Output Summary (Last 24 hours) at 01/24/15 1051 Last data filed at 01/24/15 1043  Gross per 24 hour  Intake    120 ml  Output   1550 ml  Net  -1430 ml   Filed Weights   01/23/15 1751 01/23/15 2219 01/24/15 0400  Weight: 212 lb (96.163 kg) 222 lb (100.699 kg) 221 lb 9.6 oz (100.517 kg)    PHYSICAL EXAM  General: Pleasant, NAD. Neuro: Alert and oriented X 3. Moves all extremities spontaneously. Psych: Normal affect. HEENT:  Normal  Neck: Supple without bruits.  JVD to jaw. Lungs:  Resp regular, unlabored @ rest, though speech is somewhat labored.  Audible insp/exp wheezing from bedside and throughout on  auscultation.  Crackles ~ 1/3 way up bilat.  Scattered rhonchi. Heart: IR, IR no s3, s4, or murmurs. Abdomen: Soft, non-tender, non-distended, BS + x 4.  Extremities: No clubbing, cyanosis.  Trace RLE, 1+ LLE edema. DP/PT/Radials 2+ and equal bilaterally.  Accessory Clinical Findings  CBC  Recent Labs  01/23/15 1545 01/23/15 1754 01/24/15 0527  WBC 11.5* 12.0* 11.3*  NEUTROABS 8.1*  --  7.4  HGB  --  11.2* 9.8*  HCT 31.5* 35.2* 30.4*  MCV  --  92.4 92.4  PLT  --  192 235   Basic Metabolic Panel  Recent Labs  01/23/15 1754 01/24/15 0527  NA 144 140  K 3.8 3.5  CL 110 105  CO2 25 27  GLUCOSE 169* 132*  BUN 19 16  CREATININE 1.09 0.98  CALCIUM 9.2 8.6*   Liver Function Tests  Recent Labs  01/24/15 0527  AST 24  ALT 17  ALKPHOS 50  BILITOT 1.2  PROT 5.7*  ALBUMIN 2.4*   Cardiac Enzymes  Recent Labs  01/24/15 0003  TROPONINI 0.04*   Thyroid Function  Tests  Recent Labs  01/24/15 0003  TSH 1.338    TELE  afib 100-120 on dilt @ 7.5 mg/hr.  Radiology/Studies  Dg Chest 2 View  01/23/2015   CLINICAL DATA:  Cough, knee surgery 1 week ago,  EXAM: CHEST  2 VIEW  COMPARISON:  PA and lateral chest x-ray of March 18, 2013  FINDINGS: There is chronic elevation of the right hemidiaphragm. There are coarse lung markings at both lung bases most compatible with atelectasis. No air bronchograms are observed. The heart and pulmonary vascularity are normal. The mediastinum is normal in width. There is multilevel degenerative disc disease of the thoracic spine.  IMPRESSION: Bibasilar subsegmental atelectasis. There is no alveolar pneumonia nor CHF. There is chronic elevation of the right hemidiaphragm.   Electronically Signed   By: David  Martinique M.D.   On: 01/23/2015 16:13   Ct Angio Chest Pe W/cm &/or Wo Cm  01/23/2015   CLINICAL DATA:  Acute onset of wheezing, coughing and shortness of breath today. Status post total knee arthroplasty 01/16/2015  EXAM: CT  ANGIOGRAPHY CHEST WITH CONTRAST  TECHNIQUE: Multidetector CT imaging of the chest was performed using the standard protocol during bolus administration of intravenous contrast. Multiplanar CT image reconstructions and MIPs were obtained to evaluate the vascular anatomy.  CONTRAST:  32mL OMNIPAQUE IOHEXOL 350 MG/ML SOLN  COMPARISON:  None.  FINDINGS: Chest wall: No chest wall mass, supraclavicular or axillary lymphadenopathy. The thyroid gland appears normal. The bony thorax is intact. Moderate degenerative changes involving the thoracic spine with exaggerated thoracic kyphosis.  Mediastinum: The heart is within normal limits in size for age. No pericardial effusion. Mild tortuosity, ectasia and calcification of the thoracic aorta but no focal aneurysm or obvious dissection. No mediastinal or hilar mass or adenopathy. Scattered lymph nodes are noted. The esophagus is grossly normal.  The pulmonary arterial tree is fairly well opacified. No definite filling defects to suggest pulmonary embolism.  Lungs/ pleura: Patchy ground-glass opacity in the right long is a nonspecific finding but could reflect mild asymmetric edema, bronchiolitis or alveolitis. Patchy areas of subpleural atelectasis are noted. No pulmonary edema or pleural effusion. No focal airspace consolidation to suggest pneumonia.  6 mm nodular density noted in the superior segment of the right lower lobe on image 32. This could be a pulmonary nodule or subpleural atelectasis.  Upper abdomen: There is some reflux of contrast down the IVC which could be due to tricuspid regurgitation. A large duodenum diverticulum is noted. There is a small amount of biliary air likely due to prior cholecystectomy and sphincterotomy. Moderate pancreatic atrophy.  Review of the MIP images confirms the above findings.  IMPRESSION: 1. No CT findings for pulmonary embolism. 2. Scattered aortic calcifications but no aneurysm or dissection. 3. Patchy ground-glass opacity in the  right lung is a nonspecific finding and could be due to mild asymmetric pulmonary edema, nonspecific alveolitis or bronchiolitis. No focal airspace consolidation, overt pulmonary edema or pleural effusion. 4. 6 mm nodule in the superior segment of the right lower lobe. If the patient is at high risk for bronchogenic carcinoma, follow-up chest CT at 6-12 months is recommended. If the patient is at low risk for bronchogenic carcinoma, follow-up chest CT at 12 months is recommended. This recommendation follows the consensus statement: Guidelines for Management of Small Pulmonary Nodules Detected on CT Scans: A Statement from the [place] as published in Radiology 2005;237:395-400.   Electronically Signed   By: Marijo Sanes M.D.   On:  01/23/2015 20:11    ASSESSMENT AND PLAN  1.  Afib RVR in setting of persistent AF:  Pt admitted with elevated HR's.  As noted in H&P, no ecg's during admission for L TKA, however rates were documented as being elevated @ times.  He is on bb and xarelto @ home. His wife has noted increased DOE and decreased exercise tolerance since d/c from the hospital.  They have not been able to weigh him up to this point.  He is volume overloaded on exam.  I will increase his lasix to 40 BID.  Cont bb and dilt for HR control.  Suspect as volume and resp status improve, HR will begin to normalize.  Cont xarelto (CHA2DS2VASc = 4).  2.  Essential HTN:  BP stable to low.  Follow.  3.  OA s/p L TKA (8/26)/Deconditioning:  Will require either ongoing outpt PT vs inpt rehab.  PT to see here.  4.  Acute on Chronic Diastolic CHF:  EF 59-29% by echo in 11/2012.  Minus 1.4L since admission.  Wt recorded as being up 10 lbs since yesterday and prior admission.  He does have volume overload on exam - likely driven by rapid AF.  Repeat echo once rate better.  As above, increasing lasix to 40 bid.  BP controlled.  Cont bb/dilt for rate control.  5.  HL:  Followed by PCP.  On lipitor, fish oil,  and niacin.  Could likely d/c niacin.  6.  Dementia:  Stable/cooperative.  7.  Hypokalemia:  Supplementation ordered.  Signed, Murray Hodgkins NP   History and all data above reviewed.  Patient examined.  I agree with the findings as above.  Confused.  Some knee pain but he denies any SOB or chest pain.   The patient exam reveals [id number],  Lungs: Clear  ,  Abd: Positive bowel sounds, no rebound no guarding, Ext Positive bowel sounds, no rebound no guarding  .  All available labs, radiology testing, previous records reviewed. Agree with documented assessment and plan. Atrial fib.  Continue meds for rate control.  On Xarelto.   Diuretics increased as above.    Jeneen Rinks Kaydin Karbowski  11:42 AM  01/24/2015

## 2015-01-25 ENCOUNTER — Inpatient Hospital Stay (HOSPITAL_COMMUNITY): Payer: Medicare Other

## 2015-01-25 ENCOUNTER — Encounter (HOSPITAL_COMMUNITY): Payer: Self-pay | Admitting: General Practice

## 2015-01-25 ENCOUNTER — Encounter: Payer: Self-pay | Admitting: Internal Medicine

## 2015-01-25 DIAGNOSIS — F0390 Unspecified dementia without behavioral disturbance: Secondary | ICD-10-CM

## 2015-01-25 DIAGNOSIS — R5381 Other malaise: Secondary | ICD-10-CM

## 2015-01-25 DIAGNOSIS — R0600 Dyspnea, unspecified: Secondary | ICD-10-CM

## 2015-01-25 DIAGNOSIS — I10 Essential (primary) hypertension: Secondary | ICD-10-CM

## 2015-01-25 DIAGNOSIS — I5032 Chronic diastolic (congestive) heart failure: Secondary | ICD-10-CM

## 2015-01-25 LAB — CBC
HCT: 31.3 % — ABNORMAL LOW (ref 39.0–52.0)
Hemoglobin: 9.8 g/dL — ABNORMAL LOW (ref 13.0–17.0)
MCH: 29.1 pg (ref 26.0–34.0)
MCHC: 31.3 g/dL (ref 30.0–36.0)
MCV: 92.9 fL (ref 78.0–100.0)
PLATELETS: 189 10*3/uL (ref 150–400)
RBC: 3.37 MIL/uL — ABNORMAL LOW (ref 4.22–5.81)
RDW: 14.4 % (ref 11.5–15.5)
WBC: 11.8 10*3/uL — ABNORMAL HIGH (ref 4.0–10.5)

## 2015-01-25 LAB — BASIC METABOLIC PANEL
Anion gap: 9 (ref 5–15)
BUN: 18 mg/dL (ref 6–20)
CALCIUM: 8.7 mg/dL — AB (ref 8.9–10.3)
CO2: 26 mmol/L (ref 22–32)
Chloride: 106 mmol/L (ref 101–111)
Creatinine, Ser: 1.16 mg/dL (ref 0.61–1.24)
GFR calc Af Amer: >60 mL/min (ref >60)
GFR, EST NON AFRICAN AMERICAN: 56 mL/min — AB (ref >60)
GLUCOSE: 135 mg/dL — AB (ref 65–99)
POTASSIUM: 4.3 mmol/L (ref 3.5–5.1)
Sodium: 141 mmol/L (ref 135–145)

## 2015-01-25 LAB — T3, FREE: T3 FREE: 2.4 pg/mL (ref 2.0–4.4)

## 2015-01-25 LAB — URINE CULTURE

## 2015-01-25 MED ORDER — ALBUTEROL SULFATE (2.5 MG/3ML) 0.083% IN NEBU
2.5000 mg | INHALATION_SOLUTION | Freq: Four times a day (QID) | RESPIRATORY_TRACT | Status: DC
  Administered 2015-01-25 – 2015-01-27 (×6): 2.5 mg via RESPIRATORY_TRACT
  Filled 2015-01-25 (×16): qty 3

## 2015-01-25 MED ORDER — DILTIAZEM HCL 30 MG PO TABS
30.0000 mg | ORAL_TABLET | Freq: Four times a day (QID) | ORAL | Status: DC
  Administered 2015-01-25 – 2015-01-26 (×4): 30 mg via ORAL
  Filled 2015-01-25 (×4): qty 1

## 2015-01-25 MED ORDER — CETYLPYRIDINIUM CHLORIDE 0.05 % MT LIQD
7.0000 mL | Freq: Two times a day (BID) | OROMUCOSAL | Status: DC
  Administered 2015-01-26 – 2015-01-27 (×3): 7 mL via OROMUCOSAL

## 2015-01-25 NOTE — Evaluation (Signed)
Clinical/Bedside Swallow Evaluation Patient Details  Name: Mark Wilkinson MRN: [id number] Date of Birth: [birth year]  Today's Date: 01/25/2015 Time: SLP Start Time (ACUTE ONLY): 7412 SLP Stop Time (ACUTE ONLY): 1522 SLP Time Calculation (min) (ACUTE ONLY): 28 min  Past Medical History:  Past Medical History  Diagnosis Date  . Essential hypertension, benign   . Other and unspecified hyperlipidemia   . Diaphragmatic hernia without mention of obstruction or gangrene   . Gout, unspecified   . Calculus of kidney   . Asymptomatic varicose veins   . Lumbago   . PONV (postoperative nausea and vomiting)   . Dementia   . GERD (gastroesophageal reflux disease)   . Arthritis     "knees, hips, back" (03/18/2013)  . Chronic lower back pain   . Atrial fibrillation   . Dysrhythmia     a-fib  . History of hiatal hernia    Past Surgical History:  Past Surgical History  Procedure Laterality Date  . Appendectomy    . Cholecystectomy    . Cystoscopy with retrograde pyelogram, ureteroscopy and stent placement Left 08/13/2012    Procedure: [name], URETEROSCOPY AND STENT PLACEMENT;  Surgeon: Alexis Frock, MD;  Location: [facility];  Service: Urology;  Laterality: Left;  . Cataract extraction w/ intraocular lens implant Left 2000's  . Total knee arthroplasty Right 12/08/2012    Procedure: RIGHT TOTAL KNEE ARTHROPLASTY;  Surgeon: [name], MD;  Location: [facility];  Service: Orthopedics;  Laterality: Right;  . Tonsillectomy and adenoidectomy      "no tubes" (03/18/2013)  . Toe amputation Right ?1970's    "shot his toe off in hunting accident" (03/18/2013)  . Cystoscopy w/ stone manipulation      "twice" (03/18/2013)  . Total knee arthroplasty Left 01/16/2015    Procedure: TOTAL LEFT KNEE ARTHROPLASTY;  Surgeon: Paralee Cancel, MD;  Location: [facility];  Service: Orthopedics;  Laterality: Left;   HPI:  Mark Wilkinson is an 81 y.o. male with history of chronic  atrial fibrillation, hypertension, hyperlipidemia who has had recent left knee replacement last week and was found to be tachycardic by patient's physical therapist and was taken to patient's PCP. Patient then brought to the ER with blood pressure around 878 systolic with A. fib with RVR. CT angiogram of the chest done was showing features concerning for pulmonary edema versus bronchitis. As per the family patient has been feeling short of breath over the last couple of days on exertion. Patient also was given Lasix 40 mg IV in the ER for possible CHF.  Orders received for bedside swallow evaluation due to reports of difficulty with liquids per RN.   Assessment / Plan / Recommendation Clinical Impression  Bedside swallow evaluation complete.  Patient presents with an acute reversible dysphagia likely a result of cognitive deficits and lethargy.  Patient required consistent cues to maintain arousal throughout evaluation as well as Mod verbal and visual cues to problem solve self-feeding of trials.  Thin and nectar-thick consistencies elicited multiple swallows, wet vocal quality, and consistent throat clears following each sip despite compensations (no straw, small portions and use of double swallows).  Trials of puree resulted in multiple swallows with no overt s/s of aspiration.  Patient's wife reports that he has not slept well since admission.    Given current presentation SLP recommends short term NPO with meds to be administered whole in puree with multiple swallows and plan for SLP to follow up tomorrow for diagnostic  treatment of swallow function with likely improvements in function when patient is more awake and alert.  RN, wife and daughter present and educated on findings and recommendations; all verbalize understanding and are in agreement.      Aspiration Risk  Moderate    Diet Recommendation NPO except meds   Medication Administration: Whole meds with puree    Other  Recommendations Oral  Care Recommendations: Oral care QID Other Recommendations: Have oral suction available   Follow Up Recommendations    TBD   Frequency and Duration min 2x/week  1 week   Pertinent Vitals/Pain None    SLP Swallow Goals  see care plan    Swallow Study Prior Functional Status   regular and thin per spouse    General Other Pertinent Information: Mark Wilkinson is an 81 y.o. male with history of chronic atrial fibrillation, hypertension, hyperlipidemia who has had recent left knee replacement last week and was found to be tachycardic by patient's physical therapist and was taken to patient's PCP. Patient then brought to the ER with blood pressure around 010 systolic with A. fib with RVR. CT angiogram of the chest done was showing features concerning for pulmonary edema versus bronchitis. As per the family patient has been feeling short of breath over the last couple of days on exertion. Patient also was given Lasix 40 mg IV in the ER for possible CHF.  Orders received for bedside swallow evaluation due to reports of difficulty with liquids per RN. Type of Study: Bedside swallow evaluation Previous Swallow Assessment: none on record Diet Prior to this Study: NPO Temperature Spikes Noted: No Respiratory Status: Supplemental O2 delivered via (comment) ([place]) History of Recent Intubation: No Behavior/Cognition: Confused;Lethargic/Drowsy;Requires cueing Oral Cavity - Dentition: Adequate natural dentition/normal for age Self-Feeding Abilities: Needs assist;Needs set up Patient Positioning: Upright in bed Baseline Vocal Quality: Low vocal intensity Volitional Cough: Strong Volitional Swallow: Able to elicit    Oral/Motor/Sensory Function Overall Oral Motor/Sensory Function: Appears within functional limits for tasks assessed   Ice Chips Ice chips: Within functional limits   Thin Liquid Thin Liquid: Impaired Presentation: Cup;Straw Pharyngeal  Phase Impairments: Multiple swallows;Throat Clearing  - Delayed;Throat Clearing - Immediate;Wet Vocal Quality Other Comments: cues for small sips and extra swallow not effective at reducing throat clearing     Nectar Thick Nectar Thick Liquid: Impaired Presentation: Cup;Self Fed Pharyngeal Phase Impairments: Multiple swallows;Throat Clearing - Delayed;Throat Clearing - Immediate Other Comments: cues for small sips and extra swallow not effective at reducing thraot clears   Honey Thick Honey Thick Liquid: Not tested   Puree Puree: Within functional limits Presentation: Self Fed;Spoon Pharyngeal Phase Impairments: Multiple swallows   Solid   GO    Solid: Not tested Other Comments: due to lethergy       Gunnar Fusi, M.A., CCC-SLP [phone]  Mark Wilkinson 01/25/2015,4:35 PM

## 2015-01-25 NOTE — Evaluation (Signed)
Physical Therapy Evaluation Patient Details Name: Mark Wilkinson MRN: [id number] DOB: 04-03-1928 Today's Date: 01/25/2015   History of Present Illness  Mark Wilkinson is a 81 y.o. male with history of chronic atrial fibrillation, hypertension, hyperlipidemia, and dementia who has had recent left knee replacement last week and was found to be tachycardic by patient's physical therapist and was taken to patient's PCP. Patient also was found to be mildly hypotensive  Clinical Impression  Pt presents with dependencies in mobility secondary to recent TKR and a-fib. Pt's daughter was present during evaluation and reported his dementia is advanced and they feel he would do better in his home setting. They are requesting HHPT. Pt would benefit from skilled PT to maximize mobility and independence for return home with family providing 24 hour supervision/assistance. Pt had difficulty following commands and needed multimodal cues to mobilize. Pt was able to ambulate a few steps, but did present with tachycardia and decreased O2 saturation.    Follow Up Recommendations Home health PT;Supervision/Assistance - 24 hour    Equipment Recommendations  None recommended by PT    Recommendations for Other Services       Precautions / Restrictions Precautions Precautions: Fall;Knee Precaution Comments: monitor BP, HR Restrictions Weight Bearing Restrictions:  (wbat)      Mobility  Bed Mobility Overal bed mobility: Needs Assistance Bed Mobility: Supine to Sit     Supine to sit: Mod assist Sit to supine: Mod assist   General bed mobility comments: assist LLE of of bed, assist w/ trunk, multimodal cues for safety  Transfers Overall transfer level: Needs assistance Equipment used: Rolling walker (2 wheeled)   Sit to Stand: +2 physical assistance;Mod assist         General transfer comment: cues for hand placement, pt had difficulty following verbal commands  Ambulation/Gait Ambulation/Gait  assistance: +2 physical assistance;Min assist Ambulation Distance (Feet): 4 Feet Assistive device: Rolling walker (2 wheeled) Gait Pattern/deviations: Step-to pattern;Decreased weight shift to left;Antalgic;Decreased stance time - left;Decreased step length - right Gait velocity: decreased   General Gait Details: did side stepping with RW in front of the bed secondary to need for monitoring HR and o2 sats.  Stairs            Wheelchair Mobility    Modified Rankin (Stroke Patients Only)       Balance Overall balance assessment: Needs assistance Sitting-balance support: Feet supported;No upper extremity supported Sitting balance-Leahy Scale: Good     Standing balance support: Bilateral upper extremity supported Standing balance-Leahy Scale: Poor                               Pertinent Vitals/Pain Pain Assessment: Faces Pain Score: 4  Pain Location: knee Pain Descriptors / Indicators: Grimacing;Discomfort Pain Intervention(s): Limited activity within patient's tolerance;Monitored during session;Repositioned    Home Living Family/patient expects to be discharged to:: Private residence Living Arrangements: Spouse/significant other Available Help at Discharge: Family Type of Home: House Home Access: Stairs to enter Entrance Stairs-Rails: Left Entrance Stairs-Number of Steps: 3 Home Layout: One level Home Equipment: Environmental consultant - 2 wheels;Bedside commode      Prior Function Level of Independence: Independent with assistive device(s) (prior to TKR)               Hand Dominance        Extremity/Trunk Assessment   Upper Extremity Assessment: Defer to OT evaluation  Lower Extremity Assessment: Difficult to assess due to impaired cognition;LLE deficits/detail   LLE Deficits / Details: knee flexion 0-90 sitting EOB, knee extension 2/5,      Communication   Communication: Other (comment) (difficult to understand at times, daughter  helped interpret)  Cognition Arousal/Alertness: Awake/alert Behavior During Therapy: WFL for tasks assessed/performed Overall Cognitive Status: History of cognitive impairments - at baseline       Memory: Decreased recall of precautions;Decreased short-term memory              General Comments      Exercises Total Joint Exercises Quad Sets: AROM;Left;10 reps;Supine;Strengthening Heel Slides: AAROM;Left;10 reps;Supine;Strengthening Straight Leg Raises: AAROM;Strengthening;Left;10 reps;Supine Long Arc Quad: AROM;Strengthening;Left;10 reps;Seated Goniometric ROM: 90 flexion      Assessment/Plan    PT Assessment Patient needs continued PT services  PT Diagnosis Difficulty walking;Acute pain   PT Problem List Decreased strength;Decreased range of motion;Decreased activity tolerance;Decreased balance;Decreased mobility;Decreased knowledge of use of DME;Decreased safety awareness;Cardiopulmonary status limiting activity  PT Treatment Interventions DME instruction;Gait training;Functional mobility training;Therapeutic activities;Therapeutic exercise;Balance training;Patient/family education;Stair training   PT Goals (Current goals can be found in the Care Plan section) Acute Rehab PT Goals PT Goal Formulation: Patient unable to participate in goal setting Time For Goal Achievement: 02/01/15 Potential to Achieve Goals: Good    Frequency Min 5X/week   Barriers to discharge        Co-evaluation               End of Session Equipment Utilized During Treatment: Gait belt Activity Tolerance: Treatment limited secondary to medical complications (Comment) (HR, O2 sats) Patient left: in bed;with call bell/phone within reach;with family/visitor present Nurse Communication: Mobility status         Time: 1136-1220 PT Time Calculation (min) (ACUTE ONLY): 44 min   Charges:   PT Evaluation $Initial PT Evaluation Tier I: 1 Procedure PT Treatments $Gait Training: 8-22  mins $Therapeutic Exercise: 8-22 mins   PT G Codes:        Mark Wilkinson 01/25/2015, 12:24 PM

## 2015-01-25 NOTE — Progress Notes (Signed)
Patient Name: Mark Wilkinson Date of Encounter: 01/25/2015  Principal Problem:   Atrial fibrillation with RVR Active Problems:   Essential hypertension, benign   Obese   S/P left TKA   Physical deconditioning   Dementia   Chronic diastolic CHF (congestive heart failure)  SUBJECTIVE  Hard to understand speech. Denies chest pain. Breathing improved. Still wheezing can be heard with deep breath, however improved per family.   CURRENT MEDS . allopurinol  300 mg Oral Daily  . atenolol  50 mg Oral BID  . atorvastatin  40 mg Oral Daily  . docusate sodium  100 mg Oral BID  . ferrous sulfate  325 mg Oral TID PC  . furosemide  40 mg Intravenous BID  . niacin  1,000 mg Oral QHS  . omega-3 acid ethyl esters  1,000 mg Oral Daily  . pantoprazole  40 mg Oral Daily  . polyethylene glycol  17 g Oral BID  . potassium chloride  40 mEq Oral BID  . rivaroxaban  20 mg Oral q1800  . sodium chloride  3 mL Intravenous Q12H  . tamsulosin  0.4 mg Oral QPC supper    OBJECTIVE  Filed Vitals:   01/25/15 0448 01/25/15 0637 01/25/15 0749 01/25/15 0754  BP: 109/62  89/55 98/64  Pulse: 95  107   Temp:  98.6 F (37 C) 98.4 F (36.9 C)   TempSrc:  Oral Oral   Resp: 27     Height:      Weight:  222 lb (100.699 kg)    SpO2: 94%  96%     Intake/Output Summary (Last 24 hours) at 01/25/15 0947 Last data filed at 01/25/15 0910  Gross per 24 hour  Intake 296.75 ml  Output   1050 ml  Net -753.25 ml   Filed Weights   01/23/15 2219 01/24/15 0400 01/25/15 0637  Weight: 222 lb (100.699 kg) 221 lb 9.6 oz (100.517 kg) 222 lb (100.699 kg)    PHYSICAL EXAM  General: Pleasant, NAD. Neuro: Alert and oriented X 3. Moves all extremities spontaneously. Speech is somewhat labored.  Psych: Normal affect. HEENT:  Normal  Neck: Supple without bruits or + JVD. Lungs:  Resp regular and unlabored. Audible inspiratory and expiratory wheezing with deep breath and auscultation. Bibasilar rales.  Heart: Ir Irno  s3, s4, or murmurs. Abdomen: Soft, non-tender, non-distended, BS + x 4.  Extremities: No clubbing, cyanosis . Trace LE edema L>R. DP/PT/Radials 2+ and equal bilaterally.  Accessory Clinical Findings  CBC  Recent Labs  01/23/15 1545  01/24/15 0527 01/25/15 0501  WBC 11.5*  < > 11.3* 11.8*  NEUTROABS 8.1*  --  7.4  --   HGB  --   < > 9.8* 9.8*  HCT 31.5*  < > 30.4* 31.3*  MCV  --   < > 92.4 92.9  PLT  --   < > 200 189  < > = values in this interval not displayed. Basic Metabolic Panel  Recent Labs  01/24/15 0527 01/25/15 0501  NA 140 141  K 3.5 4.3  CL 105 106  CO2 27 26  GLUCOSE 132* 135*  BUN 16 18  CREATININE 0.98 1.16  CALCIUM 8.6* 8.7*   Liver Function Tests  Recent Labs  01/24/15 0527  AST 24  ALT 17  ALKPHOS 50  BILITOT 1.2  PROT 5.7*  ALBUMIN 2.4*   No results for input(s): LIPASE, AMYLASE in the last 72 hours. Cardiac Enzymes  Recent Labs  01/24/15 0003  TROPONINI 0.04*   Thyroid Function Tests  Recent Labs  01/24/15 0003  TSH 1.338  T3FREE 2.4    TELE  afib at rate of 100s  Radiology/Studies  Dg Chest 2 View  01/23/2015   CLINICAL DATA:  Cough, knee surgery 1 week ago,  EXAM: CHEST  2 VIEW  COMPARISON:  PA and lateral chest x-ray of March 18, 2013  FINDINGS: There is chronic elevation of the right hemidiaphragm. There are coarse lung markings at both lung bases most compatible with atelectasis. No air bronchograms are observed. The heart and pulmonary vascularity are normal. The mediastinum is normal in width. There is multilevel degenerative disc disease of the thoracic spine.  IMPRESSION: Bibasilar subsegmental atelectasis. There is no alveolar pneumonia nor CHF. There is chronic elevation of the right hemidiaphragm.   Electronically Signed   By: David  Martinique M.D.   On: 01/23/2015 16:13   Ct Angio Chest Pe W/cm &/or Wo Cm  01/23/2015   CLINICAL DATA:  Acute onset of wheezing, coughing and shortness of breath today. Status post  total knee arthroplasty 01/16/2015  EXAM: CT ANGIOGRAPHY CHEST WITH CONTRAST  TECHNIQUE: Multidetector CT imaging of the chest was performed using the standard protocol during bolus administration of intravenous contrast. Multiplanar CT image reconstructions and MIPs were obtained to evaluate the vascular anatomy.  CONTRAST:  62mL OMNIPAQUE IOHEXOL 350 MG/ML SOLN  COMPARISON:  None.  FINDINGS: Chest wall: No chest wall mass, supraclavicular or axillary lymphadenopathy. The thyroid gland appears normal. The bony thorax is intact. Moderate degenerative changes involving the thoracic spine with exaggerated thoracic kyphosis.  Mediastinum: The heart is within normal limits in size for age. No pericardial effusion. Mild tortuosity, ectasia and calcification of the thoracic aorta but no focal aneurysm or obvious dissection. No mediastinal or hilar mass or adenopathy. Scattered lymph nodes are noted. The esophagus is grossly normal.  The pulmonary arterial tree is fairly well opacified. No definite filling defects to suggest pulmonary embolism.  Lungs/ pleura: Patchy ground-glass opacity in the right long is a nonspecific finding but could reflect mild asymmetric edema, bronchiolitis or alveolitis. Patchy areas of subpleural atelectasis are noted. No pulmonary edema or pleural effusion. No focal airspace consolidation to suggest pneumonia.  6 mm nodular density noted in the superior segment of the right lower lobe on image 32. This could be a pulmonary nodule or subpleural atelectasis.  Upper abdomen: There is some reflux of contrast down the IVC which could be due to tricuspid regurgitation. A large duodenum diverticulum is noted. There is a small amount of biliary air likely due to prior cholecystectomy and sphincterotomy. Moderate pancreatic atrophy.  Review of the MIP images confirms the above findings.  IMPRESSION: 1. No CT findings for pulmonary embolism. 2. Scattered aortic calcifications but no aneurysm or  dissection. 3. Patchy ground-glass opacity in the right lung is a nonspecific finding and could be due to mild asymmetric pulmonary edema, nonspecific alveolitis or bronchiolitis. No focal airspace consolidation, overt pulmonary edema or pleural effusion. 4. 6 mm nodule in the superior segment of the right lower lobe. If the patient is at high risk for bronchogenic carcinoma, follow-up chest CT at 6-12 months is recommended. If the patient is at low risk for bronchogenic carcinoma, follow-up chest CT at 12 months is recommended. This recommendation follows the consensus statement: Guidelines for Management of Small Pulmonary Nodules Detected on CT Scans: A Statement from the [place] as published in Radiology 2005;237:395-400.   Electronically Signed   By:  Marijo Sanes M.D.   On: 01/23/2015 20:11    ASSESSMENT AND PLAN   1. Afib RVR in setting of persistent AF: - Rate improved 90-100s. Continue atenolol and Xarelto.  Consider switching to PO dilt, likely later today or tomorrow.  - Rate improving with IV diuresis.  - DCCV once stable?  2. Essential HTN:BP stable soft low.follow closely.  3. OA s/p L TKA (8/26)/Deconditioning: Will require either ongoing outpt PT vs inpt rehab. PT to see here.  4. Acute on Chronic Diastolic CHF: EF 01-75% by echo in 11/2012. Repeat echo once rate better. - Diuresed 1.7L so far. Weight is same. Continue IV diuresis. BNP of 538. Cr 1.16/  5. HL: Followed by PCP. On lipitor, fish oil, and niacin. Could likely d/c niacin.  6. Dementia: Stable/cooperative.   7. Hypokalemia:Resolved.  Signed, Bhagat,Bhavinkumar PA-C Pager [phone]  History and all data above reviewed.  Patient examined.  I agree with the findings as above.  Still with wheezing and confusion.  Good urine output.  The patient exam reveals DPO:[id number]  ,  Lungs: Upper airway wheezing  ,  Abd: Positive bowel sounds, no rebound no guarding, Ext No edema  .  All available  labs, radiology testing, previous records reviewed. Agree with documented assessment and plan. Continue IV diuresis.  Switch to PO cardizem.  I ordered albuterol.  PT consulted.   Jeneen Rinks Siniya Lichty  10:44 AM  01/25/2015

## 2015-01-25 NOTE — Discharge Summary (Signed)
[facility] PROGRESS NOTE Assessment/Plan: Atrial fibrillation with RVR: - Chads 2 vasc score is 3, continue Xarelto - On Cardizem drip, and previous 2-D echo showed grade 1 diastolic dysfunction with an EF of 60%. - Cardiology assistance appreciated, he was started on IV Lasix, continue monitor strict I's and O's. - His HR continue to fluctuate spent arranging 93 -115.  Acute on  Chronic diastolic CHF (congestive heart failure) - Start on IV Lasix with moderate diuresis, monitor strict I's and O's daily weights. - Creatinine at baseline. - Further management per cardiology.  Essential hypertension: - Continue atenolol and Lasix. - Borderline low.  Dementia without behavioral disturbances: Stable.  Normocytic anemia: - At baseline continue to monitor.  Mild leukocytosis likely reactive: - Has remained afebrile but continues to have some mild leukocytosis, he has had mild drops in his oxygen saturation on 2 L, I am concerned about the results of aspiration, will check a swallowing evaluation.  Physical deconditioning  Dementia - stable.  Essential hypertension, benign Pressure seems to be well controlled,    Code Status: DNR Family Communication: wife and daughter at bedside Disposition Plan: Home when improved   Consultants:  Cardiology  Procedures:  Echo  Antibiotics:  None  HPI/Subjective: He relates no further chest pain or shortness of breath.  Objective: Filed Vitals:   01/25/15 0754 01/25/15 1021 01/25/15 1110 01/25/15 1208  BP: 98/64 110/97 95/63   Pulse:  93 94 115  Temp:   98 F (36.7 C)   TempSrc:   Oral   Resp:  26 27   Height:      Weight:      SpO2:  93% 96% 88%    Intake/Output Summary (Last 24 hours) at 01/25/15 1325 Last data filed at 01/25/15 1120  Gross per 24 hour  Intake 476.75 ml  Output    925 ml  Net -448.25 ml   Filed Weights   01/23/15 2219 01/24/15 0400 01/25/15 0637  Weight: 100.699 kg (222 lb)  100.517 kg (221 lb 9.6 oz) 100.699 kg (222 lb)    Exam:  General: Alert, awake, oriented x3, in no acute distress.  HEENT: No bruits, no goiter. +JVD Heart: Regular rate and rhythm. Lungs: Good air movement, clear Abdomen: Soft, nontender, nondistended, positive bowel sounds.  Neuro: Grossly intact, nonfocal.   Data Reviewed: Basic Metabolic Panel:  Recent Labs Lab 01/20/15 0603 01/23/15 1545 01/23/15 1754 01/24/15 0527 01/25/15 0501  NA 142 145* 144 140 141  K 5.2* 4.5 3.8 3.5 4.3  CL 109 105 110 105 106  CO2 27 22 25 27 26   GLUCOSE 176* 169* 169* 132* 135*  BUN 27* 19 19 16 18   CREATININE 1.19 1.11 1.09 0.98 1.16  CALCIUM 9.2 9.2 9.2 8.6* 8.7*   Liver Function Tests:  Recent Labs Lab 01/24/15 0527  AST 24  ALT 17  ALKPHOS 50  BILITOT 1.2  PROT 5.7*  ALBUMIN 2.4*   No results for input(s): LIPASE, AMYLASE in the last 168 hours. No results for input(s): AMMONIA in the last 168 hours. CBC:  Recent Labs Lab 01/19/15 0944 01/20/15 0603 01/23/15 1545 01/23/15 1754 01/24/15 0527 01/25/15 0501  WBC 8.9 13.1* 11.5* 12.0* 11.3* 11.8*  NEUTROABS  --   --  8.1*  --  7.4  --   HGB 11.1* 11.3*  --  11.2* 9.8* 9.8*  HCT 32.9* 34.0* 31.5* 35.2* 30.4* 31.3*  MCV 90.4 90.2  --  92.4 92.4 92.9  PLT 107* 137*  --  192 200 189   Cardiac Enzymes:  Recent Labs Lab 01/24/15 0003  TROPONINI 0.04*   BNP (last 3 results)  Recent Labs  01/24/15 0003  BNP 538.6*    ProBNP (last 3 results) No results for input(s): PROBNP in the last 8760 hours.  CBG: No results for input(s): GLUCAP in the last 168 hours.  Recent Results (from the past 240 hour(s))  Urine culture     Status: None   Collection Time: 01/23/15  8:35 PM  Result Value Ref Range Status   Specimen Description URINE, RANDOM  Final   Special Requests NONE  Final   Culture MULTIPLE SPECIES PRESENT, SUGGEST RECOLLECTION  Final   Report Status 01/25/2015 FINAL  Final     Studies: Dg Chest 2  View  01/23/2015   CLINICAL DATA:  Cough, knee surgery 1 week ago,  EXAM: CHEST  2 VIEW  COMPARISON:  PA and lateral chest x-ray of March 18, 2013  FINDINGS: There is chronic elevation of the right hemidiaphragm. There are coarse lung markings at both lung bases most compatible with atelectasis. No air bronchograms are observed. The heart and pulmonary vascularity are normal. The mediastinum is normal in width. There is multilevel degenerative disc disease of the thoracic spine.  IMPRESSION: Bibasilar subsegmental atelectasis. There is no alveolar pneumonia nor CHF. There is chronic elevation of the right hemidiaphragm.   Electronically Signed   By: David  Martinique M.D.   On: 01/23/2015 16:13   Ct Angio Chest Pe W/cm &/or Wo Cm  01/23/2015   CLINICAL DATA:  Acute onset of wheezing, coughing and shortness of breath today. Status post total knee arthroplasty 01/16/2015  EXAM: CT ANGIOGRAPHY CHEST WITH CONTRAST  TECHNIQUE: Multidetector CT imaging of the chest was performed using the standard protocol during bolus administration of intravenous contrast. Multiplanar CT image reconstructions and MIPs were obtained to evaluate the vascular anatomy.  CONTRAST:  19mL OMNIPAQUE IOHEXOL 350 MG/ML SOLN  COMPARISON:  None.  FINDINGS: Chest wall: No chest wall mass, supraclavicular or axillary lymphadenopathy. The thyroid gland appears normal. The bony thorax is intact. Moderate degenerative changes involving the thoracic spine with exaggerated thoracic kyphosis.  Mediastinum: The heart is within normal limits in size for age. No pericardial effusion. Mild tortuosity, ectasia and calcification of the thoracic aorta but no focal aneurysm or obvious dissection. No mediastinal or hilar mass or adenopathy. Scattered lymph nodes are noted. The esophagus is grossly normal.  The pulmonary arterial tree is fairly well opacified. No definite filling defects to suggest pulmonary embolism.  Lungs/ pleura: Patchy ground-glass opacity in  the right long is a nonspecific finding but could reflect mild asymmetric edema, bronchiolitis or alveolitis. Patchy areas of subpleural atelectasis are noted. No pulmonary edema or pleural effusion. No focal airspace consolidation to suggest pneumonia.  6 mm nodular density noted in the superior segment of the right lower lobe on image 32. This could be a pulmonary nodule or subpleural atelectasis.  Upper abdomen: There is some reflux of contrast down the IVC which could be due to tricuspid regurgitation. A large duodenum diverticulum is noted. There is a small amount of biliary air likely due to prior cholecystectomy and sphincterotomy. Moderate pancreatic atrophy.  Review of the MIP images confirms the above findings.  IMPRESSION: 1. No CT findings for pulmonary embolism. 2. Scattered aortic calcifications but no aneurysm or dissection. 3. Patchy ground-glass opacity in the right lung is a nonspecific finding and could be due to mild asymmetric pulmonary edema, nonspecific  alveolitis or bronchiolitis. No focal airspace consolidation, overt pulmonary edema or pleural effusion. 4. 6 mm nodule in the superior segment of the right lower lobe. If the patient is at high risk for bronchogenic carcinoma, follow-up chest CT at 6-12 months is recommended. If the patient is at low risk for bronchogenic carcinoma, follow-up chest CT at 12 months is recommended. This recommendation follows the consensus statement: Guidelines for Management of Small Pulmonary Nodules Detected on CT Scans: A Statement from the [employer] as published in Radiology 2005;237:395-400.   Electronically Signed   By: Marijo Sanes M.D.   On: 01/23/2015 20:11    Scheduled Meds: . albuterol  2.5 mg Nebulization Q6H  . allopurinol  300 mg Oral Daily  . atenolol  50 mg Oral BID  . atorvastatin  40 mg Oral Daily  . diltiazem  30 mg Oral 4 times per day  . docusate sodium  100 mg Oral BID  . ferrous sulfate  325 mg Oral TID PC  .  furosemide  40 mg Intravenous BID  . niacin  1,000 mg Oral QHS  . omega-3 acid ethyl esters  1,000 mg Oral Daily  . pantoprazole  40 mg Oral Daily  . polyethylene glycol  17 g Oral BID  . potassium chloride  40 mEq Oral BID  . rivaroxaban  20 mg Oral q1800  . sodium chloride  3 mL Intravenous Q12H  . tamsulosin  0.4 mg Oral QPC supper   Continuous Infusions:   Time Spent: 25 min   Charlynne Cousins  [facility] Pager [phone].  If 7PM-7AM, please contact night-coverage at [email or web address], password [facility] 01/25/2015, 1:25 PM  LOS: 2 days

## 2015-01-25 NOTE — Progress Notes (Signed)
  Echocardiogram 2D Echocardiogram has been performed.  Donata Clay 01/25/2015, 2:00 PM

## 2015-01-26 ENCOUNTER — Inpatient Hospital Stay (HOSPITAL_COMMUNITY): Payer: Medicare Other

## 2015-01-26 DIAGNOSIS — Z96652 Presence of left artificial knee joint: Secondary | ICD-10-CM

## 2015-01-26 MED ORDER — RESOURCE THICKENUP CLEAR PO POWD: ORAL | Status: DC | PRN

## 2015-01-26 MED ORDER — RESOURCE THICKENUP CLEAR PO POWD
ORAL | Status: DC | PRN
  Filled 2015-01-26: qty 125

## 2015-01-26 MED ORDER — DILTIAZEM HCL ER COATED BEADS 180 MG PO CP24
180.0000 mg | ORAL_CAPSULE | Freq: Every day | ORAL | Status: DC
  Administered 2015-01-26 – 2015-01-27 (×2): 180 mg via ORAL
  Filled 2015-01-26 (×2): qty 1

## 2015-01-26 MED ORDER — FUROSEMIDE 40 MG PO TABS
40.0000 mg | ORAL_TABLET | Freq: Two times a day (BID) | ORAL | Status: DC
  Administered 2015-01-26 – 2015-01-27 (×2): 40 mg via ORAL
  Filled 2015-01-26 (×2): qty 1

## 2015-01-26 NOTE — Discharge Summary (Signed)
[facility] PROGRESS NOTE Assessment/Plan: Atrial fibrillation with RVR: - Chads 2 vasc score is 3, continue Xarelto - Titrating Cardizem for a HR < 100, and previous 2-D echo showed grade 1 diastolic dysfunction with an EF of 60%. - Cardiology assistance appreciated, now on oral lasix. - His HR continue to fluctuate, now > 100.  - will monitor an additional day, to see if further titration of rate controlling medications is needed.  Acute on  Chronic diastolic CHF (congestive heart failure) - Monitor strict I's and O's daily weights. - Creatinine at baseline. Cont to be negative, now changed to oral lasix. - Further management per cardiology.  Essential hypertension: - Continue atenolol and Lasix. - Borderline low.  Dementia without behavioral disturbances: Stable.  Normocytic anemia: - At baseline continue to monitor.  Mild leukocytosis: - Has remained afebrile but continues to have some mild leukocytosis, SLP consulted recommended MBS, now NPO.  Physical deconditioning  Dementia - stable.  Essential hypertension, benign Pressure seems to be well controlled,    Code Status: DNR Family Communication: wife and daughter at bedside Disposition Plan: Home when improved   Consultants:  Cardiology  Procedures:  Echo  Antibiotics:  None  HPI/Subjective: He relates no further chest pain or shortness of breath.  Objective: Filed Vitals:   01/26/15 0621 01/26/15 0700 01/26/15 0935 01/26/15 1216  BP: 114/94     Pulse:    136  Temp:  98.6 F (37 C)    TempSrc:  Oral    Resp:      Height:      Weight:      SpO2:   94% 85%    Intake/Output Summary (Last 24 hours) at 01/26/15 1249 Last data filed at 01/26/15 1117  Gross per 24 hour  Intake    480 ml  Output   2780 ml  Net  -2300 ml   Filed Weights   01/23/15 2219 01/24/15 0400 01/25/15 0637  Weight: 100.699 kg (222 lb) 100.517 kg (221 lb 9.6 oz) 100.699 kg (222 lb)    Exam:  General:  Alert, awake, oriented x3, in no acute distress.  HEENT: No bruits, no goiter. +JVD Heart: Regular rate and rhythm. Lungs: Good air movement, clear Abdomen: Soft, nontender, nondistended, positive bowel sounds.  Neuro: Grossly intact, nonfocal.   Data Reviewed: Basic Metabolic Panel:  Recent Labs Lab 01/20/15 0603 01/23/15 1545 01/23/15 1754 01/24/15 0527 01/25/15 0501  NA 142 145* 144 140 141  K 5.2* 4.5 3.8 3.5 4.3  CL 109 105 110 105 106  CO2 27 22 25 27 26   GLUCOSE 176* 169* 169* 132* 135*  BUN 27* 19 19 16 18   CREATININE 1.19 1.11 1.09 0.98 1.16  CALCIUM 9.2 9.2 9.2 8.6* 8.7*   Liver Function Tests:  Recent Labs Lab 01/24/15 0527  AST 24  ALT 17  ALKPHOS 50  BILITOT 1.2  PROT 5.7*  ALBUMIN 2.4*   No results for input(s): LIPASE, AMYLASE in the last 168 hours. No results for input(s): AMMONIA in the last 168 hours. CBC:  Recent Labs Lab 01/20/15 0603 01/23/15 1545 01/23/15 1754 01/24/15 0527 01/25/15 0501  WBC 13.1* 11.5* 12.0* 11.3* 11.8*  NEUTROABS  --  8.1*  --  7.4  --   HGB 11.3*  --  11.2* 9.8* 9.8*  HCT 34.0* 31.5* 35.2* 30.4* 31.3*  MCV 90.2  --  92.4 92.4 92.9  PLT 137*  --  192 200 189   Cardiac Enzymes:  Recent Labs  Lab 01/24/15 0003  TROPONINI 0.04*   BNP (last 3 results)  Recent Labs  01/24/15 0003  BNP 538.6*    ProBNP (last 3 results) No results for input(s): PROBNP in the last 8760 hours.  CBG: No results for input(s): GLUCAP in the last 168 hours.  Recent Results (from the past 240 hour(s))  Urine culture     Status: None   Collection Time: 01/23/15  8:35 PM  Result Value Ref Range Status   Specimen Description URINE, RANDOM  Final   Special Requests NONE  Final   Culture MULTIPLE SPECIES PRESENT, SUGGEST RECOLLECTION  Final   Report Status 01/25/2015 FINAL  Final     Studies: No results found.  Scheduled Meds: . albuterol  2.5 mg Nebulization Q6H  . allopurinol  300 mg Oral Daily  . antiseptic oral  rinse  7 mL Mouth Rinse BID  . atenolol  50 mg Oral BID  . atorvastatin  40 mg Oral Daily  . diltiazem  180 mg Oral Daily  . docusate sodium  100 mg Oral BID  . ferrous sulfate  325 mg Oral TID PC  . furosemide  40 mg Oral BID  . niacin  1,000 mg Oral QHS  . omega-3 acid ethyl esters  1,000 mg Oral Daily  . pantoprazole  40 mg Oral Daily  . polyethylene glycol  17 g Oral BID  . potassium chloride  40 mEq Oral BID  . rivaroxaban  20 mg Oral q1800  . sodium chloride  3 mL Intravenous Q12H  . tamsulosin  0.4 mg Oral QPC supper   Continuous Infusions:   Time Spent: 25 min   Charlynne Cousins  [facility] Pager [phone].  If 7PM-7AM, please contact night-coverage at [email or web address], password [facility] 01/26/2015, 12:49 PM  LOS: 3 days

## 2015-01-26 NOTE — Progress Notes (Signed)
Speech Language Pathology Treatment: Dysphagia  Patient Details Name: Mark Wilkinson MRN: [id number] DOB: [birth year] Today's Date: 01/26/2015 Time: 3716-9678 SLP Time Calculation (min) (ACUTE ONLY): 15 min  Assessment / Plan / Recommendation Clinical Impression  Pt's current cognitive status has improved since SLP's report yesterday, but he still showed signs and symptoms of aspiration on thin liquids (immediate and delayed throat clear and cough). SLP provided tactile cues to patient, small sips and controlled the size of the bolus, however the pt still appeared discoordinated. When pt was laying down he was coughing frequently on his secretions. SLP suspects pt is aspirating, will f/u with MBS this afternoon.   HPI Other Pertinent Information: DANNY YACKLEY is an 81 y.o. male with history of chronic atrial fibrillation, hypertension, hyperlipidemia who has had recent left knee replacement last week and was found to be tachycardic by patient's physical therapist and was taken to patient's PCP. Patient then brought to the ER with blood pressure around 938 systolic with A. fib with RVR. CT angiogram of the chest done was showing features concerning for pulmonary edema versus bronchitis. As per the family patient has been feeling short of breath over the last couple of days on exertion. Patient also was given Lasix 40 mg IV in the ER for possible CHF.  Orders received for bedside swallow evaluation due to reports of difficulty with liquids per RN.   Pertinent Vitals Pain Assessment: Faces Faces Pain Scale: No hurt  SLP Plan  MBS    Recommendations Diet recommendations: NPO Medication Administration: Whole meds with puree Supervision: Patient able to self feed              Oral Care Recommendations: Oral care QID Plan: MBS    GO    Lanier Ensign, Student-SLP  Lanier Ensign 01/26/2015, 11:02 AM

## 2015-01-26 NOTE — Care Management Important Message (Signed)
Important Message  Patient Details  Name: Mark Wilkinson MRN: [id number] Date of Birth: 1927-08-08   Medicare Important Message Given:  Yes-second notification given    Pricilla Handler 01/26/2015, 11:26 AM

## 2015-01-26 NOTE — Progress Notes (Signed)
Physical Therapy Treatment Patient Details Name: Mark Wilkinson MRN: [id number] DOB: 17-May-1928 Today's Date: 01/26/2015    History of Present Illness Mark Wilkinson is a 81 y.o. male with history of chronic atrial fibrillation, hypertension, hyperlipidemia, and dementia who has had recent left knee replacement last week and was found to be tachycardic by patient's physical therapist and was taken to patient's PCP. Patient also was found to be mildly hypotensive    PT Comments    Pt was able to minimally progress his gait today with RW, but he can be up to mod assist as he fatigues.  His max observed HR was 137 and his O2 sats dropped to 85% on RA during gait with DOE 3/4.  Pt is very fatigued.  I believe he would be safest to d/c to SNF for rehab before returning home with his wife's assistance, especially if he has to go home on O2 and manage him, the walker, the O2 line.  He is a very high fall risk at this time.  PT will continue to follow acutely.   Follow Up Recommendations  SNF     Equipment Recommendations  None recommended by PT    Recommendations for Other Services   NA     Precautions / Restrictions Precautions Precautions: Fall;Knee;Other (comment) (monitor O2 sats, HR) Precaution Comments: reinforced no pillow under surgical knee, and locked out knee flexion on his bed.  Knee Immobilizer - Left:  (did not see in room, did not use for PT session today)    Mobility  Bed Mobility Overal bed mobility: Needs Assistance Bed Mobility: Supine to Sit     Supine to sit: Mod assist Sit to supine: Min assist   General bed mobility comments: Mod assist to provide leverage (hand held assist) for pt to pull up to sitting.   Transfers Overall transfer level: Needs assistance Equipment used: Rolling walker (2 wheeled) Transfers: Sit to/from [facility] Sit to Stand: Min assist;Mod assist Stand pivot transfers: Mod assist;Min assist       General transfer comment:  Min assist from elevated surface, mod assist from lower recliner chair to support trunk to get to standing.  Max repeated verbal cues for safe hand placement.   Ambulation/Gait Ambulation/Gait assistance: Mod assist;+2 safety/equipment Ambulation Distance (Feet): 20 Feet Assistive device: Rolling walker (2 wheeled) Gait Pattern/deviations: Step-through pattern;Antalgic;Trunk flexed Gait velocity: decreased Gait velocity interpretation: Below normal speed for age/gender General Gait Details: Pt with antalgic gait pattern over flexed and unstable knee joint.  Flexed posture as pt keeps pushing RW too far ahead of him during gait.  Max verbal and manual cues for safety during ambulation.  O2 sats decreased during gait and DOE was 3/4          Balance Overall balance assessment: Needs assistance Sitting-balance support: Feet supported;Bilateral upper extremity supported Sitting balance-Leahy Scale: Fair     Standing balance support: Bilateral upper extremity supported Standing balance-Leahy Scale: Poor                      Cognition Arousal/Alertness: Awake/alert Behavior During Therapy: WFL for tasks assessed/performed Overall Cognitive Status: History of cognitive impairments - at baseline                      Exercises Total Joint Exercises Ankle Circles/Pumps: AROM;Both;10 reps;Supine Short Arc Quad: AROM;Left;10 reps;Supine Heel Slides: AAROM;Left;10 reps;Supine Straight Leg Raises: AROM;Left;10 reps;Supine Long Arc Quad: AROM;Left;10 reps;Seated Goniometric ROM: 10-90  degrees of flexion        Pertinent Vitals/Pain Pain Assessment: Faces Faces Pain Scale: Hurts little more Pain Location: left knee with end ROM Pain Descriptors / Indicators: Aching Pain Intervention(s): Limited activity within patient's tolerance;Monitored during session;Repositioned           PT Goals (current goals can now be found in the care plan section) Acute Rehab PT  Goals Patient Stated Goal: to go home Progress towards PT goals: Progressing toward goals    Frequency  Min 3X/week    PT Plan Discharge plan needs to be updated;Frequency needs to be updated       End of Session Equipment Utilized During Treatment: Gait belt;Oxygen Activity Tolerance: Patient limited by fatigue;Patient limited by pain;Treatment limited secondary to medical complications (Comment) (limited by HR and decreased O2 sats) Patient left: in bed;with call bell/phone within reach;with family/visitor present;with nursing/sitter in room     Time: 1140-1216 PT Time Calculation (min) (ACUTE ONLY): 36 min  Charges:  $Gait Training: 8-22 mins $Therapeutic Activity: 8-22 mins                      Makoa Satz B. [place], [place], DPT [phone]   01/26/2015, 12:24 PM

## 2015-01-26 NOTE — Progress Notes (Signed)
01/26/2015 SATURATION QUALIFICATIONS: (This note is used to comply with regulatory documentation for home oxygen)  Patient Saturations on Room Air at Rest = 92%  Patient Saturations on Room Air while Ambulating = 85%  Patient Saturations on 2 Liters of oxygen while Ambulating = 95%  Please briefly explain why patient needs home oxygen: Pt de-sats and has increased DOE while mobilizing on RA.  He needs supplemental O2 during mobility to maintain sats >90%  Olly Shiner B. [place], [place], DPT [phone]

## 2015-01-26 NOTE — Care Management Important Message (Signed)
Important Message  Patient Details  Name: Mark Wilkinson MRN: [id number] Date of Birth: [birth year]   Medicare Important Message Given:  Yes-second notification given    Pricilla Handler 01/26/2015, 11:25 AM

## 2015-01-26 NOTE — Progress Notes (Signed)
Speech Pathology Note MBSS complete. Full report located under chart review in imaging section. Ruther Ephraim, MA CCC-SLP [phone]  

## 2015-01-26 NOTE — Progress Notes (Signed)
    SUBJECTIVE:  Looks more comfortable today.  No acute distress   PHYSICAL EXAM Filed Vitals:   01/26/15 0400 01/26/15 0440 01/26/15 0621 01/26/15 0700  BP:  107/76 114/94   Pulse: 111     Temp:  98.8 F (37.1 C)  98.6 F (37 C)  TempSrc:  Oral  Oral  Resp: 20     Height:      Weight:      SpO2: 92%      General:  No distress Lungs:  Few scattered wheezes improved Heart:  Irregular Abdomen:  Positive bowel sounds, no rebound no guarding Extremities:  Left knee swelling.  Otherwise trace edema.    LABS: Lab Results  Component Value Date   TROPONINI 0.04* 01/24/2015   No results found for this or any previous visit (from the past 24 hour(s)).  Intake/Output Summary (Last 24 hours) at 01/26/15 0916 Last data filed at 01/26/15 0842  Gross per 24 hour  Intake    480 ml  Output   2030 ml  Net  -1550 ml    ECHO:  - Left ventricle: The cavity size was normal. Wall thickness was increased in a pattern of mild LVH. Systolic function was normal. The estimated ejection fraction was in the range of 55% to 65%. Wall motion was normal; there were no regional wall motion abnormalities. - Aortic valve: Mildly calcified annulus. - Mitral valve: There was mild regurgitation. - Left atrium: The atrium was mildly dilated.  ASSESSMENT AND PLAN:  ATRIAL FIB:  Rate is OK but I will increase the Cardizem slightly and start CD.    Continue anticoagulation and rate control.   ACUTE ON CHRONIC DIASTOLIC HF:  Continued good urine output.  Creat remains stable.  Change to PO diuretic.   I think he is OK to send home when OK with IM.  Needs to be evaluated for home O2 if he does not already have this.    Jeneen Rinks [facility] 01/26/2015 9:16 AM

## 2015-01-27 ENCOUNTER — Ambulatory Visit: Payer: 59 | Admitting: Physical Therapy

## 2015-01-27 LAB — BASIC METABOLIC PANEL
ANION GAP: 7 (ref 5–15)
BUN: 19 mg/dL (ref 6–20)
CHLORIDE: 98 mmol/L — AB (ref 101–111)
CO2: 33 mmol/L — ABNORMAL HIGH (ref 22–32)
Calcium: 8.3 mg/dL — ABNORMAL LOW (ref 8.9–10.3)
Creatinine, Ser: 1.22 mg/dL (ref 0.61–1.24)
GFR, EST NON AFRICAN AMERICAN: 52 mL/min — AB (ref >60)
Glucose, Bld: 138 mg/dL — ABNORMAL HIGH (ref 65–99)
POTASSIUM: 4.1 mmol/L (ref 3.5–5.1)
SODIUM: 138 mmol/L (ref 135–145)

## 2015-01-27 MED ORDER — DILTIAZEM HCL ER COATED BEADS 240 MG PO CP24: 240.0000 mg | ORAL_CAPSULE | Freq: Every day | ORAL | Status: DC

## 2015-01-27 MED ORDER — FUROSEMIDE 40 MG PO TABS: 40.0000 mg | ORAL_TABLET | Freq: Every day | ORAL | Status: DC

## 2015-01-27 MED ORDER — HALOPERIDOL LACTATE 5 MG/ML IJ SOLN
2.0000 mg | Freq: Once | INTRAMUSCULAR | Status: AC
  Administered 2015-01-27: 2 mg via INTRAMUSCULAR
  Filled 2015-01-27: qty 1

## 2015-01-27 MED ORDER — HALOPERIDOL LACTATE 5 MG/ML IJ SOLN: 2.0000 mg | Freq: Once | INTRAMUSCULAR | Status: DC

## 2015-01-27 NOTE — Discharge Summary (Signed)
Physician Discharge Summary  Mark Wilkinson RDE:[id number] DOB: Jun 28, 1927 DOA: 01/23/2015  PCP: Redge Gainer, MD  Admit date: 01/23/2015 Discharge date: 01/27/2015  Time spent: 35 minutes  Recommendations for Outpatient Follow-up:  1. Follow-up with cardiology in 1-2 weeks.  2. Check weight and blood pressure and titrate antihypertensive medications as tolerated. 3. Check a basic metabolic panel in 1 week. 4. Will go home with continuing physical therapy.  Filed Weights   01/24/15 0400 01/25/15 0637 01/27/15 0445  Weight: 100.517 kg (221 lb 9.6 oz) 100.699 kg (222 lb) 95.573 kg (210 lb 11.2 oz)   Discharge Diagnoses:  Principal Problem:   Atrial fibrillation with RVR Active Problems:   Essential hypertension, benign   Obese   S/P left TKA   Physical deconditioning   Dementia   Chronic diastolic CHF (congestive heart failure)   Discharge Condition:stable  Diet recommendation: low sodium  Filed Weights   01/24/15 0400 01/25/15 0637 01/27/15 0445  Weight: 100.517 kg (221 lb 9.6 oz) 100.699 kg (222 lb) 95.573 kg (210 lb 11.2 oz)    History of present illness:  81 year old male with past medical history of chronic atrial fibrillation on oral anticoagulation D recent knee replacement and was found to be tachycardic by physical therapy was to contact PCP and was found to be mildly hypotensive and in A. fib with RVR.  Hospital Course:  Atrial fibrillation with RVR: He was done on IV Cardizem and his heart rate was controlled, he was transitioned to oral Cardizem and continue on atenolol. Cardiology was consulted who agreed with management. - Chads 2 vasc score is 3, continue Xarelto  Acute on chronic diastolic heart failure: He was started on IV diuresis, and ovaries about 4.5 L. Echo was done initial diastolic heart failure. He was not on Lasix at home he will go on Lasix 40 minute once daily. He will follow-up with cardiology in 1 week will check a basic metabolic  panel.  Essential hypertension: Well controlled on atenolol, diltiazem and Lasix. Follow-up with cardiology as an outpatient.  Dementia without behavioral disturbance: Stable.  Normocytic anemia: Hemoglobin remained stable.  Mild Leukocytosis: Due to his leukocytosis and that he remained afebrile was a concern of aspiration pneumonitis. Space evaluation was consulted and recommended a dysphagia 3 diet. An MBBS was done.  Physical deconditioning: Recent replacement will continue physical therapy at home.   Consultants:  Cardiology  Procedures:  Echo  Antibiotics:  None  Discharge Exam: Filed Vitals:   01/27/15 1212  BP: 100/70  Pulse: 94  Temp: 98.7 F (37.1 C)  Resp: 17    General: A&O x3 Cardiovascular: RRR Respiratory: good air movement CTA B/L  Discharge Instructions   Discharge Instructions    Diet - low sodium heart healthy    Complete by:  As directed      Increase activity slowly    Complete by:  As directed           Current Discharge Medication List    START taking these medications   Details  diltiazem (CARDIZEM CD) 240 MG 24 hr capsule Take 1 capsule (240 mg total) by mouth daily. Qty: 30 capsule, Refills: 0    furosemide (LASIX) 40 MG tablet Take 1 tablet (40 mg total) by mouth daily. Qty: 30 tablet, Refills: 0      CONTINUE these medications which have NOT CHANGED   Details  acetaminophen (TYLENOL) 500 MG tablet Take 1 tablet (500 mg total) by mouth every 6 (six) hours as  needed. Qty: 30 tablet, Refills: 0    allopurinol (ZYLOPRIM) 300 MG tablet TAKE 1 TABLET DAILY Qty: 90 tablet, Refills: 0    atenolol (TENORMIN) 50 MG tablet Take 1 tablet (50 mg total) by mouth 2 (two) times daily. Qty: 90 tablet, Refills: 3    atorvastatin (LIPITOR) 40 MG tablet TAKE 1 TABLET BY MOUTH EVERY DAY Qty: 90 tablet, Refills: 0    B Complex-C (SUPER B COMPLEX PO) Take 1 tablet by mouth at bedtime.     Cholecalciferol (VITAMIN D3) 1000 UNITS  CAPS Take 2,000 Units by mouth every morning.     docusate sodium (COLACE) 100 MG capsule Take 1 capsule (100 mg total) by mouth 2 (two) times daily. Qty: 10 capsule, Refills: 0    esomeprazole (NEXIUM) 40 MG capsule TAKE 1 CAPSULE DAILY Qty: 90 capsule, Refills: 1    ferrous sulfate 325 (65 FE) MG tablet Take 1 tablet (325 mg total) by mouth 3 (three) times daily after meals. Refills: 3    HYDROcodone-acetaminophen (NORCO) 7.5-325 MG per tablet Take 1-2 tablets by mouth every 4 (four) hours as needed for moderate pain. Qty: 100 tablet, Refills: 0    Multiple Vitamin (MULTIVITAMIN WITH MINERALS) TABS Take 1 tablet by mouth every morning.     niacin (NIASPAN) 500 MG CR tablet TAKE 3 TABLETS BY MOUTH AT BEDTIME Qty: 270 tablet, Refills: 1    Omega-3 Fatty Acids (FISH OIL) 1000 MG CAPS Take 1,000 mg by mouth 2 (two) times daily.     polyethylene glycol (MIRALAX / GLYCOLAX) packet Take 17 g by mouth 2 (two) times daily. Qty: 14 each, Refills: 0    Ranibizumab (LUCENTIS) 0.3 MG/0.05ML SOLN Place 1 application into the left eye. Injection q 6-8 weeks    RAPAFLO 8 MG CAPS capsule TAKE 1 CAPSULE DAILY WITH FOOD. Qty: 90 capsule, Refills: 0    rivaroxaban (XARELTO) 20 MG TABS tablet Take 1 tablet (20 mg total) by mouth daily. Qty: 30 tablet      STOP taking these medications     GLUCOSAMINE PO        Allergies  Allergen Reactions  . Penicillins Rash   Follow-up Information    Follow up with [facility].   Why:  Registered Nurse and Physical Therapy   Contact information:   [place] [address] [phone]       Follow up with [facility].   Why:  Oxygen   Contact information:   [address] [place]  [address] [phone]       Follow up with Minus Breeding, MD In 1 week.   Specialty:  Cardiology   Contact information:   [address] [place] [address] [phone]        The  results of significant diagnostics from this hospitalization (including imaging, microbiology, ancillary and laboratory) are listed below for reference.    Significant Diagnostic Studies: Dg Chest 2 View  01/23/2015   CLINICAL DATA:  Cough, knee surgery 1 week ago,  EXAM: CHEST  2 VIEW  COMPARISON:  PA and lateral chest x-ray of March 18, 2013  FINDINGS: There is chronic elevation of the right hemidiaphragm. There are coarse lung markings at both lung bases most compatible with atelectasis. No air bronchograms are observed. The heart and pulmonary vascularity are normal. The mediastinum is normal in width. There is multilevel degenerative disc disease of the thoracic spine.  IMPRESSION: Bibasilar subsegmental  atelectasis. There is no alveolar pneumonia nor CHF. There is chronic elevation of the right hemidiaphragm.   Electronically Signed   By: David  Martinique M.D.   On: 01/23/2015 16:13   Ct Angio Chest Pe W/cm &/or Wo Cm  01/23/2015   CLINICAL DATA:  Acute onset of wheezing, coughing and shortness of breath today. Status post total knee arthroplasty 01/16/2015  EXAM: CT ANGIOGRAPHY CHEST WITH CONTRAST  TECHNIQUE: Multidetector CT imaging of the chest was performed using the standard protocol during bolus administration of intravenous contrast. Multiplanar CT image reconstructions and MIPs were obtained to evaluate the vascular anatomy.  CONTRAST:  52mL OMNIPAQUE IOHEXOL 350 MG/ML SOLN  COMPARISON:  None.  FINDINGS: Chest wall: No chest wall mass, supraclavicular or axillary lymphadenopathy. The thyroid gland appears normal. The bony thorax is intact. Moderate degenerative changes involving the thoracic spine with exaggerated thoracic kyphosis.  Mediastinum: The heart is within normal limits in size for age. No pericardial effusion. Mild tortuosity, ectasia and calcification of the thoracic aorta but no focal aneurysm or obvious dissection. No mediastinal or hilar mass or adenopathy. Scattered lymph nodes  are noted. The esophagus is grossly normal.  The pulmonary arterial tree is fairly well opacified. No definite filling defects to suggest pulmonary embolism.  Lungs/ pleura: Patchy ground-glass opacity in the right long is a nonspecific finding but could reflect mild asymmetric edema, bronchiolitis or alveolitis. Patchy areas of subpleural atelectasis are noted. No pulmonary edema or pleural effusion. No focal airspace consolidation to suggest pneumonia.  6 mm nodular density noted in the superior segment of the right lower lobe on image 32. This could be a pulmonary nodule or subpleural atelectasis.  Upper abdomen: There is some reflux of contrast down the IVC which could be due to tricuspid regurgitation. A large duodenum diverticulum is noted. There is a small amount of biliary air likely due to prior cholecystectomy and sphincterotomy. Moderate pancreatic atrophy.  Review of the MIP images confirms the above findings.  IMPRESSION: 1. No CT findings for pulmonary embolism. 2. Scattered aortic calcifications but no aneurysm or dissection. 3. Patchy ground-glass opacity in the right lung is a nonspecific finding and could be due to mild asymmetric pulmonary edema, nonspecific alveolitis or bronchiolitis. No focal airspace consolidation, overt pulmonary edema or pleural effusion. 4. 6 mm nodule in the superior segment of the right lower lobe. If the patient is at high risk for bronchogenic carcinoma, follow-up chest CT at 6-12 months is recommended. If the patient is at low risk for bronchogenic carcinoma, follow-up chest CT at 12 months is recommended. This recommendation follows the consensus statement: Guidelines for Management of Small Pulmonary Nodules Detected on CT Scans: A Statement from the [place] as published in Radiology 2005;237:395-400.   Electronically Signed   By: Marijo Sanes M.D.   On: 01/23/2015 20:11   Dg Swallowing Func-speech Pathology  01/26/2015    Objective Swallowing  Evaluation:    Patient Details  Name: Mark Wilkinson MRN: [id number] Date of Birth: Aug 24, 1927  Today's Date: 01/26/2015 Time: SLP Start Time (ACUTE ONLY): 1230-SLP Stop Time (ACUTE ONLY): 1300 SLP Time Calculation (min) (ACUTE ONLY): 30 min  Past Medical History:  Past Medical History  Diagnosis Date  . Essential hypertension, benign   . Other and unspecified hyperlipidemia   . Diaphragmatic hernia without mention of obstruction or gangrene   . Gout, unspecified   . Calculus of kidney   . Asymptomatic varicose veins   . Lumbago   . PONV (  postoperative nausea and vomiting)   . Dementia   . GERD (gastroesophageal reflux disease)   . Arthritis     "knees, hips, back" (03/18/2013)  . Chronic lower back pain   . Atrial fibrillation   . Dysrhythmia     a-fib  . History of hiatal hernia    Past Surgical History:  Past Surgical History  Procedure Laterality Date  . Appendectomy    . Cholecystectomy    . Cystoscopy with retrograde pyelogram, ureteroscopy and stent placement  Left 08/13/2012    Procedure: [place], URETEROSCOPY AND STENT  PLACEMENT;  Surgeon: Alexis Frock, MD;  Location: [facility];  Service: Urology;  Laterality: Left;  . Cataract extraction w/ intraocular lens implant Left 2000's  . Total knee arthroplasty Right 12/08/2012    Procedure: RIGHT TOTAL KNEE ARTHROPLASTY;  Surgeon: [name], MD;   Location: [facility];  Service: Orthopedics;  Laterality: Right;  . Tonsillectomy and adenoidectomy      "no tubes" (03/18/2013)  . Toe amputation Right ?1970's    "shot his toe off in hunting accident" (03/18/2013)  . Cystoscopy w/ stone manipulation      "twice" (03/18/2013)  . Total knee arthroplasty Left 01/16/2015    Procedure: TOTAL LEFT KNEE ARTHROPLASTY;  Surgeon: Paralee Cancel, MD;   Location: [facility];  Service: Orthopedics;  Laterality: Left;   HPI:  Other Pertinent Information: NICKIE DEREN is an 81 y.o. male with history  of chronic atrial fibrillation, hypertension,  hyperlipidemia who has had  recent left knee replacement last week and was found to be tachycardic by  patient's physical therapist and was taken to patient's PCP. Patient then  brought to the ER with blood pressure around 053 systolic with A. fib with  RVR. CT angiogram of the chest done was showing features concerning for  pulmonary edema versus bronchitis. As per the family patient has been  feeling short of breath over the last couple of days on exertion. Patient  also was given Lasix 40 mg IV in the ER for possible CHF.  Orders received  for bedside swallow evaluation due to reports of difficulty with liquids  per RN.  No Data Recorded  Assessment / Plan / Recommendation CHL IP CLINICAL IMPRESSIONS 01/26/2015  Therapy Diagnosis Moderate oral phase dysphagia;Moderate pharyngeal phase  dysphagia  Clinical Impression Pt demonstrates a moderate oral and oropharyngeal  dysphagia with premature spillage and intermittently delayed swallow with  aspiration before the swallow with decreased, unreliable sensation  (occasional cough). Cued cough very hard and adequate to remove aspirate  and  penetrate. Airway closure also diminished with occasional aspiration  during the swallow.  Chin tuck ineffective.  Pt required moderate cues to  clear oral and vallecular residual. Recommend Dys 3 diet and nectar thick  liquids with reminders for a second swallow. SLP to f/u for toelrance and  education. Pt will need Home Health SLP f/u. Possible repeat MBS for  upgrade with improvement in functional reserve.       CHL IP TREATMENT RECOMMENDATION 01/26/2015  Treatment Recommendations Therapy as outlined in treatment plan below     CHL IP DIET RECOMMENDATION 01/26/2015  SLP Diet Recommendations Dysphagia 3 (Mech soft);Nectar  Liquid Administration via (None)  Medication Administration Whole meds with puree  Compensations Slow rate;Small sips/bites;Multiple dry swallows after each  bite/sip  Postural Changes and/or Swallow Maneuvers (None)      CHL IP OTHER RECOMMENDATIONS 01/26/2015  Recommended Consults (None)  Oral  Care Recommendations Oral care BID  Other Recommendations (None)     No flowsheet data found.   CHL IP FREQUENCY AND DURATION 01/26/2015  Speech Therapy Frequency (ACUTE ONLY) min 2x/week  Treatment Duration 2 weeks     Pertinent Vitals/Pain NA    SLP Swallow Goals No flowsheet data found.  No flowsheet data found.    CHL IP REASON FOR REFERRAL 01/26/2015  Reason for Referral Objectively evaluate swallowing function     CHL IP ORAL PHASE 01/26/2015  Lips (None)  Tongue (None)  Mucous membranes (None)  Nutritional status (None)  Other (None)  Oxygen therapy (None)  Oral Phase Impaired  Oral - Pudding Teaspoon (None)  Oral - Pudding Cup (None)  Oral - Honey Teaspoon (None)  Oral - Honey Cup (None)  Oral - Honey Syringe (None)  Oral - Nectar Teaspoon (None)  Oral - Nectar Cup (None)  Oral - Nectar Straw (None)  Oral - Nectar Syringe (None)  Oral - Ice Chips (None)  Oral - Thin Teaspoon (None)  Oral - Thin Cup (None)  Oral - Thin Straw (None)  Oral - Thin Syringe (None)  Oral - Puree (None)  Oral - Mechanical Soft (None)  Oral - Regular (None)  Oral - Multi-consistency (None)  Oral - Pill (None)  Oral Phase - Comment (None)      CHL IP PHARYNGEAL PHASE 01/26/2015  Pharyngeal Phase Impaired  Pharyngeal - Pudding Teaspoon (None)  Penetration/Aspiration details (pudding teaspoon) (None)  Pharyngeal - Pudding Cup (None)  Penetration/Aspiration details (pudding cup) (None)  Pharyngeal - Honey Teaspoon (None)  Penetration/Aspiration details (honey teaspoon) (None)  Pharyngeal - Honey Cup (None)  Penetration/Aspiration details (honey cup) (None)  Pharyngeal - Honey Syringe (None)  Penetration/Aspiration details (honey syringe) (None)  Pharyngeal - Nectar Teaspoon (None)  Penetration/Aspiration details (nectar teaspoon) (None)  Pharyngeal - Nectar Cup (None)  Penetration/Aspiration details (nectar cup) (None)  Pharyngeal - Nectar Straw (None)   Penetration/Aspiration details (nectar straw) (None)  Pharyngeal - Nectar Syringe (None)  Penetration/Aspiration details (nectar syringe) (None)  Pharyngeal - Ice Chips (None)  Penetration/Aspiration details (ice chips) (None)  Pharyngeal - Thin Teaspoon (None)  Penetration/Aspiration details (thin teaspoon) (None)  Pharyngeal - Thin Cup (None)  Penetration/Aspiration details (thin cup) (None)  Pharyngeal - Thin Straw (None)  Penetration/Aspiration details (thin straw) (None)  Pharyngeal - Thin Syringe (None)  Penetration/Aspiration details (thin syringe') (None)  Pharyngeal - Puree (None)  Penetration/Aspiration details (puree) (None)  Pharyngeal - Mechanical Soft (None)  Penetration/Aspiration details (mechanical soft) (None)  Pharyngeal - Regular (None)  Penetration/Aspiration details (regular) (None)  Pharyngeal - Multi-consistency (None)  Penetration/Aspiration details (multi-consistency) (None)  Pharyngeal - Pill (None)  Penetration/Aspiration details (pill) (None)  Pharyngeal Comment (None)      No flowsheet data found.  No flowsheet data found.        Herbie [place], [place] [phone]  [place], Katherene Ponto 01/26/2015, 3:01 PM     Microbiology: Recent Results (from the past 240 hour(s))  Urine culture     Status: None   Collection Time: 01/23/15  8:35 PM  Result Value Ref Range Status   Specimen Description URINE, RANDOM  Final   Special Requests NONE  Final   Culture MULTIPLE SPECIES PRESENT, SUGGEST RECOLLECTION  Final   Report Status 01/25/2015 FINAL  Final     Labs: Basic Metabolic Panel:  Recent Labs Lab 01/23/15 1545 01/23/15 1754 01/24/15 0527 01/25/15 0501 01/27/15 0459  NA 145* 144 140 141 138  K 4.5 3.8  3.5 4.3 4.1  CL 105 110 105 106 98*  CO2 22 25 27 26  33*  GLUCOSE 169* 169* 132* 135* 138*  BUN 19 19 16 18 19   CREATININE 1.11 1.09 0.98 1.16 1.22  CALCIUM 9.2 9.2 8.6* 8.7* 8.3*   Liver Function Tests:  Recent Labs Lab 01/24/15 0527  AST 24  ALT 17   ALKPHOS 50  BILITOT 1.2  PROT 5.7*  ALBUMIN 2.4*   No results for input(s): LIPASE, AMYLASE in the last 168 hours. No results for input(s): AMMONIA in the last 168 hours. CBC:  Recent Labs Lab 01/23/15 1545 01/23/15 1754 01/24/15 0527 01/25/15 0501  WBC 11.5* 12.0* 11.3* 11.8*  NEUTROABS 8.1*  --  7.4  --   HGB  --  11.2* 9.8* 9.8*  HCT 31.5* 35.2* 30.4* 31.3*  MCV  --  92.4 92.4 92.9  PLT  --  192 200 189   Cardiac Enzymes:  Recent Labs Lab 01/24/15 0003  TROPONINI 0.04*   BNP: BNP (last 3 results)  Recent Labs  01/24/15 0003  BNP 538.6*    ProBNP (last 3 results) No results for input(s): PROBNP in the last 8760 hours.  CBG: No results for input(s): GLUCAP in the last 168 hours.     Signed:  Charlynne Cousins  [facility] 01/27/2015, 1:11 PM

## 2015-01-27 NOTE — Progress Notes (Signed)
Patient demonstrating increasing confusion with agitation and mild combativeness. Attempting to dislodge IV and telemetry leads; generally unsafe behavior. Spoke with Dr. Broadus John, MD on-call for [facility].  Order for haldol 2mg  IV or IM received and communicated to primary RN.  Continuing to monitor closely.

## 2015-01-27 NOTE — Clinical Social Work Note (Signed)
Clinical Social Work Assessment  Patient Details  Name: Mark Wilkinson MRN: [id number] Date of Birth: Mar 14, 1928  Date of referral:  01/27/15               Reason for consult:  Facility Placement                Permission sought to share information with:  Case Manager Permission granted to share information::  Yes, Verbal Permission Granted (Granted by pt wife)  Name::        Agency::     Relationship::     Contact Information:     Housing/Transportation Living arrangements for the past 2 months:  Single Family Home Source of Information:  Spouse Patient Interpreter Needed:  None Criminal Activity/Legal Involvement Pertinent to Current Situation/Hospitalization:  No - Comment as needed Significant Relationships:  Adult Children, Spouse Lives with:  Spouse Do you feel safe going back to the place where you live?  Yes Need for family participation in patient care:  Yes (Comment)  Care giving concerns:  PT Recommending SNF but pt family requesting dc home   Social Worker assessment / plan:  CSW visited pt wife at bedside and confirmed understanding of SNF recommendation. She informed CSW that plan is for pt to dc home. Per pt wife, pt is receiving assistance at home from a service. However, unclear if this is home health or a private care service. CSW informed pt wife that RNCM will likely follow up confirm if there are any home needs. Per pt wife, pt daughter will assist with transport. Pt has no further hospital social work needs. CSW signing off.  Employment status:  Retired Health visitor PT Recommendations:  [place] / Referral to community resources:   (Declined)  Patient/Family's Response to care: Pt wife prefers for pt to dc home and is declining SNF at this time  Patient/Family's Understanding of and Emotional Response to Diagnosis, Current Treatment, and Prognosis:  Pt wife with fair insight on pt condition however based off  conversation it would seems pt children are more involved and have a better understanding. Pt wife with appropriate emotional response and glad pt will be discharged soon.  Emotional Assessment Appearance:  Well-Groomed Attitude/Demeanor/Rapport:  Unable to Assess Affect (typically observed):  Unable to Assess Orientation:  Oriented to Self Alcohol / Substance use:  Not Applicable Psych involvement (Current and /or in the community):  No (Comment)  Discharge Needs  Concerns to be addressed:  Discharge Planning Concerns Readmission within the last 30 days:  Yes Current discharge risk:  Dependent with Mobility, Cognitively Impaired Barriers to Discharge:  No Barriers Identified   [place], [place]

## 2015-01-27 NOTE — Care Management Note (Signed)
Case Management Note  Patient Details  Name: Mark Wilkinson MRN: [id number] Date of Birth: 12-Dec-1927  Subjective/Objective: Pt admitted for Afib RVR- Plan for d/c today home with wife. Has additional support of family. Daughters pay out of pocket for [place] via [employer] to help parents- which parents are unaware.   Action/Plan: CM did speak to daughter Anderson Malta in reference to disposition needs. Agreeable to services of RN/PT with [facility]. SOC to begin within 24-48 hrs post d/c. Daughter agreeable to [facility] to provide 02. Portable tank to be delivered to room before d/c. Daughter Opal Sidles to provide transportation home via car. No further needs from CM at this time.    Expected Discharge Date:                  Expected Discharge Plan:  [place]  In-House Referral:  NA  Discharge planning Services  CM Consult  Post Acute Care Choice:  Resumption of Svcs/PTA Provider, Home Health Choice offered to:  Patient, Spouse, Adult Children  DME Arranged:  [facility] DME Agency:  [place] Arranged:  PT, RN [facility] Agency:  [place]  Status of Service:  Completed, signed off  Medicare Important Message Given:  Yes-second notification given Date Medicare IM Given:    Medicare IM give by:    Date Additional Medicare IM Given:    Additional Medicare Important Message give by:     If discussed at [place] of Stay Meetings, dates discussed:    Additional Comments:  Bethena Roys, RN 01/27/2015, 10:24 AM

## 2015-01-27 NOTE — Progress Notes (Signed)
Patient Name: Mark Wilkinson Date of Encounter: 01/27/2015  Principal Problem:   Atrial fibrillation with RVR Active Problems:   Essential hypertension, benign   Obese   S/P left TKA   Physical deconditioning   Dementia   Chronic diastolic CHF (congestive heart failure)    SUBJECTIVE  Sleeping. No chest pain or sob. Patient had a increased confusion with overnight.   CURRENT MEDS . albuterol  2.5 mg Nebulization Q6H  . allopurinol  300 mg Oral Daily  . antiseptic oral rinse  7 mL Mouth Rinse BID  . atenolol  50 mg Oral BID  . atorvastatin  40 mg Oral Daily  . diltiazem  180 mg Oral Daily  . docusate sodium  100 mg Oral BID  . ferrous sulfate  325 mg Oral TID PC  . furosemide  40 mg Oral BID  . haloperidol lactate  2 mg Intravenous Once  . niacin  1,000 mg Oral QHS  . omega-3 acid ethyl esters  1,000 mg Oral Daily  . pantoprazole  40 mg Oral Daily  . polyethylene glycol  17 g Oral BID  . potassium chloride  40 mEq Oral BID  . rivaroxaban  20 mg Oral q1800  . sodium chloride  3 mL Intravenous Q12H  . tamsulosin  0.4 mg Oral QPC supper    OBJECTIVE  Filed Vitals:   01/26/15 2313 01/27/15 0445 01/27/15 0800 01/27/15 0829  BP: 102/63 109/69 100/79   Pulse: 100 86 98   Temp: 98.7 F (37.1 C) 97.9 F (36.6 C) 98.5 F (36.9 C)   TempSrc: Oral Oral    Resp: 20 21    Height:      Weight:  210 lb 11.2 oz (95.573 kg)    SpO2: 94% 95% 93% 95%    Intake/Output Summary (Last 24 hours) at 01/27/15 1021 Last data filed at 01/27/15 0823  Gross per 24 hour  Intake    300 ml  Output   1300 ml  Net  -1000 ml   Filed Weights   01/24/15 0400 01/25/15 0637 01/27/15 0445  Weight: 221 lb 9.6 oz (100.517 kg) 222 lb (100.699 kg) 210 lb 11.2 oz (95.573 kg)    PHYSICAL EXAM  General: Confused but cooperative in  NAD. Neuro: Alert and oriented X 3. Moves all extremities spontaneously. Psych: Normal affect. HEENT:  Normal  Neck: Supple without bruits or JVD. Lungs:  Resp  regular and unlabored, CTA. Heart: Irregular no s3, s4, or murmurs. Abdomen: Soft, non-tender, non-distended, BS + x 4.  Extremities: No clubbing, cyanosis.. DP  1+ and equal bilaterally. L knee swelling with multiple bruise.   Accessory Clinical Findings  CBC  Recent Labs  01/25/15 0501  WBC 11.8*  HGB 9.8*  HCT 31.3*  MCV 92.9  PLT 831   Basic Metabolic Panel  Recent Labs  01/25/15 0501 01/27/15 0459  NA 141 138  K 4.3 4.1  CL 106 98*  CO2 26 33*  GLUCOSE 135* 138*  BUN 18 19  CREATININE 1.16 1.22  CALCIUM 8.7* 8.3*    TELE  afib at rate of 90s.    ASSESSMENT AND PLAN  1. Afib RVR in setting of persistent AF: - Rate in  90s.. Continue atenolol, cardizem CD and Xarelto.   2. Essential HTH. Stable  3. OA s/p L TKA (8/26)/Deconditioning: per primary  4. Acute on Chronic Diastolic CHF: EF 51-76% by echo in 11/2012. Repeat echo once rate better. - Diuresed 4.9L so far.  Weight down 2lb.Marland Kitchen Continue Po lasix. Cr stable.  - plan to discharge on oxygen.   5. HL: Followed by PCP. On lipitor, fish oil, and niacin. Could likely d/c niacin.  6. Dementia: Was agitated and combative last night. Currently mildly confused but cooperative. Per primary.  Signed, Bhagat,Bhavinkumar PA-C Pager [phone]  History and all data above reviewed.  Patient examined.  I agree with the findings as above.  The patient exam reveals FMB:[id number]  ,  Lungs: Decreased breath sounds that clear with coughing  ,  Abd: Positive bowel sounds, no rebound no guarding, Ext Mild edema left greater than right  .  All available labs, radiology testing, previous records reviewed. Agree with documented assessment and plan. Atrial fib:  Rate is OK.  BP will not allow med titration at this time but his heart rate is OK for discharge.  He needs careful fluid management at home with daily weights.  He will need to be seen soon after discharge for TOC follow up.    Jeneen Rinks [facility]  10:39 AM   01/27/2015

## 2015-01-27 NOTE — Progress Notes (Signed)
SLP Cancellation Note  Patient Details Name: Mark Wilkinson MRN: [id number] DOB: 02/20/1928   Cancelled treatment:       Reason Eval/Treat Not Completed: Other (comment) (pt being bathed during SLP attempt this morning).   Germain Osgood, M.A. CCC-SLP [phone]  Germain Osgood 01/27/2015, 2:20 PM

## 2015-01-31 DIAGNOSIS — Z471 Aftercare following joint replacement surgery: Secondary | ICD-10-CM | POA: Diagnosis not present

## 2015-01-31 DIAGNOSIS — F0390 Unspecified dementia without behavioral disturbance: Secondary | ICD-10-CM | POA: Diagnosis not present

## 2015-01-31 DIAGNOSIS — G8929 Other chronic pain: Secondary | ICD-10-CM | POA: Diagnosis not present

## 2015-01-31 DIAGNOSIS — I481 Persistent atrial fibrillation: Secondary | ICD-10-CM | POA: Diagnosis not present

## 2015-01-31 DIAGNOSIS — M159 Polyosteoarthritis, unspecified: Secondary | ICD-10-CM | POA: Diagnosis not present

## 2015-01-31 DIAGNOSIS — I10 Essential (primary) hypertension: Secondary | ICD-10-CM | POA: Diagnosis not present

## 2015-01-31 NOTE — Discharge Summary (Signed)
Physician Discharge Summary  Patient ID: TRONG GOSLING MRN: [id number] DOB/AGE: 09-04-1927 81 y.o.  Admit date: 01/16/2015 Discharge date: 01/20/2015   Procedures:  Procedure(s) (LRB): TOTAL LEFT KNEE ARTHROPLASTY (Left)  Attending Physician:  Dr. Paralee Cancel   Admission Diagnoses:   Left knee primary OA / pain  Discharge Diagnoses:  Principal Problem:   S/P left TKA Active Problems:   S/P right TKA   Obese  Past Medical History  Diagnosis Date  . Essential hypertension, benign   . Other and unspecified hyperlipidemia   . Diaphragmatic hernia without mention of obstruction or gangrene   . Gout, unspecified   . Calculus of kidney   . Asymptomatic varicose veins   . Lumbago   . PONV (postoperative nausea and vomiting)   . Dementia   . GERD (gastroesophageal reflux disease)   . Arthritis     "knees, hips, back" (03/18/2013)  . Chronic lower back pain   . Atrial fibrillation   . Dysrhythmia     a-fib  . History of hiatal hernia     HPI:    Mark Wilkinson, 81 y.o. male, has a history of pain and functional disability in the left knee due to arthritis and has failed non-surgical conservative treatments for greater than 12 weeks to include NSAID's and/or analgesics, corticosteriod injections, viscosupplementation injections, use of assistive devices and activity modification. Onset of symptoms was gradual, starting >10 years ago with gradually worsening course since that time. The patient noted prior procedures on the knee to include arthroplasty on the right knee(s). Patient currently rates pain in the left knee(s) at 7 out of 10 with activity. Patient has worsening of pain with activity and weight bearing, pain that interferes with activities of daily living, pain with passive range of motion, crepitus and joint swelling. Patient has evidence of periarticular osteophytes and joint space narrowing by imaging studies. There is no active infection. Risks, benefits and  expectations were discussed with the patient. Risks including but not limited to the risk of anesthesia, blood clots, nerve damage, blood vessel damage, failure of the prosthesis, infection and up to and including death. Patient understand the risks, benefits and expectations and wishes to proceed with surgery.   PCP: Redge Gainer, MD   Discharged Condition: good  Hospital Course:  Patient underwent the above stated procedure on 01/16/2015. Patient tolerated the procedure well and brought to the recovery room in good condition and subsequently to the floor.  POD #1 BP: 131/81 ; Pulse: 82 ; Temp: 97.6 F (36.4 C) ; Resp: 18 Patient at baseline pleasant confusion. Wife states they had a rough night with activity and limited sleep. No specific events. Dorsiflexion/plantar flexion intact, incision: dressing C/D/I, no cellulitis present and compartment soft.   LABS  Basename    HGB  12.9  HCT  38.6   POD #2  BP: 94/55 ; Pulse: 93 ; Temp: 98.1 F (36.7 C) ; Resp: 16 Patient reports pain as mild, expressed. Wife states he isn't quite at his baseline metal state, but he usually doesn't until he gets home from the hospital. No events otherwise throughout the night. Ready to be discharged home.  Dorsiflexion/plantar flexion intact, incision: dressing C/D/I, no cellulitis present and compartment soft.   LABS  Basename    HGB  11.3  HCT  34.3   POD #3  BP: 150/70 ; Pulse: 126 ; Temp: 97.7 F (36.6 C) ; Resp: 16 Patient expresses no pain while laying in bed. Patient  wife and daughter state that he is a little more confused today, may be from being out of his normal environment. Concern from his increased HR. Creatinine dropped from 2.01 to 1.66.  Increased HR, already on Eliquis to treat his a-fib. Dorsiflexion/plantar flexion intact, incision: dressing C/D/I, no cellulitis present and compartment soft.   LABS  Basename    HGB  11.1  HCT  32.9   POD #4  BP: 149/88 ; Pulse: 103 ; Temp:  99.2 F (37.3 C) ; Resp: 22 Patient expresses no pain while sitting in his chair. Patient wife express that he is still confused and a little agitated, but doing well. They are planning on getting a CNA to help during the days. Wife feels that if he gets into his normal environment, he will return more to a normal state.Marland Kitchen His creatinine dropped again from 1.66 to 1.19. Incident with a HR of 140's during the night. An additional dose of beta block was given and HR seems more stable near 100. Dorsiflexion/plantar flexion intact, incision: dressing C/D/I, no cellulitis present and compartment soft.   LABS  Basename    HGB  11.3  HCT  34.0    Discharge Exam: General appearance: alert, cooperative and no distress Extremities: Homans sign is negative, no sign of DVT, no edema, redness or tenderness in the calves or thighs and no ulcers, gangrene or trophic changes  Disposition: Home with follow up in 2 weeks   Follow-up Information    Follow up with [facility].   Why:  home health physical therapy   Contact information:   [address] [place] [address] [phone]       Follow up with [name], MD. Schedule an appointment as soon as possible for a visit in 2 weeks.   Specialty:  Orthopedic Surgery   Contact information:   [address] [place] [address] [phone]       Discharge Instructions    Call MD / Call 911    Complete by:  As directed   If you experience chest pain or shortness of breath, CALL 911 and be transported to the hospital emergency room.  If you develope a fever above 101 F, pus (white drainage) or increased drainage or redness at the wound, or calf pain, call your surgeon's office.     Change dressing    Complete by:  As directed   Maintain surgical dressing until follow up in the clinic. If the edges start to pull up, may reinforce with tape. If the dressing is no longer working, may remove and cover  with gauze and tape, but must keep the area dry and clean.  Call with any questions or concerns.     Constipation Prevention    Complete by:  As directed   Drink plenty of fluids.  Prune juice may be helpful.  You may use a stool softener, such as Colace (over the counter) 100 mg twice a day.  Use MiraLax (over the counter) for constipation as needed.     Diet - low sodium heart healthy    Complete by:  As directed      Discharge instructions    Complete by:  As directed   Maintain surgical dressing until follow up in the clinic. If the edges start to pull up, may reinforce with tape. If the dressing is no longer working, may remove and cover with gauze and tape, but must keep the area dry  and clean.  Follow up in 2 weeks at [facility]. Call with any questions or concerns.     Increase activity slowly as tolerated    Complete by:  As directed   Weight bearing as tolerated with assist device (walker, cane, etc) as directed, use it as long as suggested by your surgeon or therapist, typically at least 4-6 weeks.     TED hose    Complete by:  As directed   Use stockings (TED hose) for 2 weeks on both leg(s).  You may remove them at night for sleeping.             Medication List    STOP taking these medications        GLUCOSAMINE PO     quinapril 10 MG tablet  Commonly known as:  ACCUPRIL      TAKE these medications        acetaminophen 500 MG tablet  Commonly known as:  TYLENOL  Take 1 tablet (500 mg total) by mouth every 6 (six) hours as needed.     allopurinol 300 MG tablet  Commonly known as:  ZYLOPRIM  TAKE 1 TABLET DAILY     atenolol 50 MG tablet  Commonly known as:  TENORMIN  Take 1 tablet (50 mg total) by mouth 2 (two) times daily.     atorvastatin 40 MG tablet  Commonly known as:  LIPITOR  TAKE 1 TABLET BY MOUTH EVERY DAY     docusate sodium 100 MG capsule  Commonly known as:  COLACE  Take 1 capsule (100 mg total) by mouth 2 (two) times daily.       esomeprazole 40 MG capsule  Commonly known as:  NEXIUM  TAKE 1 CAPSULE DAILY     ferrous sulfate 325 (65 FE) MG tablet  Take 1 tablet (325 mg total) by mouth 3 (three) times daily after meals.     Fish Oil 1000 MG Caps  Take 1,000 mg by mouth 2 (two) times daily.     HYDROcodone-acetaminophen 7.5-325 MG per tablet  Commonly known as:  NORCO  Take 1-2 tablets by mouth every 4 (four) hours as needed for moderate pain.     LUCENTIS 0.3 MG/0.05ML Soln  Generic drug:  Ranibizumab  Place 1 application into the left eye. Injection q 6-8 weeks     multivitamin with minerals Tabs tablet  Take 1 tablet by mouth every morning.     niacin 500 MG CR tablet  Commonly known as:  NIASPAN  TAKE 3 TABLETS BY MOUTH AT BEDTIME     polyethylene glycol packet  Commonly known as:  MIRALAX / GLYCOLAX  Take 17 g by mouth 2 (two) times daily.     RAPAFLO 8 MG Caps capsule  Generic drug:  silodosin  TAKE 1 CAPSULE DAILY WITH FOOD.     rivaroxaban 20 MG Tabs tablet  Commonly known as:  XARELTO  Take 1 tablet (20 mg total) by mouth daily.     SUPER B COMPLEX PO  Take 1 tablet by mouth at bedtime.     Vitamin D3 1000 UNITS Caps  Take 2,000 Units by mouth every morning.         Signed: [place] Pugh. Cam Harnden   PA-C

## 2015-02-01 DIAGNOSIS — Z96652 Presence of left artificial knee joint: Secondary | ICD-10-CM | POA: Diagnosis not present

## 2015-02-01 DIAGNOSIS — Z471 Aftercare following joint replacement surgery: Secondary | ICD-10-CM | POA: Diagnosis not present

## 2015-02-01 DIAGNOSIS — I481 Persistent atrial fibrillation: Secondary | ICD-10-CM | POA: Diagnosis not present

## 2015-02-01 DIAGNOSIS — I10 Essential (primary) hypertension: Secondary | ICD-10-CM | POA: Diagnosis not present

## 2015-02-01 DIAGNOSIS — G8929 Other chronic pain: Secondary | ICD-10-CM | POA: Diagnosis not present

## 2015-02-01 DIAGNOSIS — F0390 Unspecified dementia without behavioral disturbance: Secondary | ICD-10-CM | POA: Diagnosis not present

## 2015-02-01 DIAGNOSIS — M159 Polyosteoarthritis, unspecified: Secondary | ICD-10-CM | POA: Diagnosis not present

## 2015-02-03 DIAGNOSIS — F0390 Unspecified dementia without behavioral disturbance: Secondary | ICD-10-CM | POA: Diagnosis not present

## 2015-02-03 DIAGNOSIS — M159 Polyosteoarthritis, unspecified: Secondary | ICD-10-CM | POA: Diagnosis not present

## 2015-02-03 DIAGNOSIS — G8929 Other chronic pain: Secondary | ICD-10-CM | POA: Diagnosis not present

## 2015-02-03 DIAGNOSIS — Z471 Aftercare following joint replacement surgery: Secondary | ICD-10-CM | POA: Diagnosis not present

## 2015-02-03 DIAGNOSIS — I481 Persistent atrial fibrillation: Secondary | ICD-10-CM | POA: Diagnosis not present

## 2015-02-03 DIAGNOSIS — I10 Essential (primary) hypertension: Secondary | ICD-10-CM | POA: Diagnosis not present

## 2015-02-06 DIAGNOSIS — H34812 Central retinal vein occlusion, left eye: Secondary | ICD-10-CM | POA: Diagnosis not present

## 2015-02-07 DIAGNOSIS — F0390 Unspecified dementia without behavioral disturbance: Secondary | ICD-10-CM | POA: Diagnosis not present

## 2015-02-07 DIAGNOSIS — I481 Persistent atrial fibrillation: Secondary | ICD-10-CM | POA: Diagnosis not present

## 2015-02-07 DIAGNOSIS — M159 Polyosteoarthritis, unspecified: Secondary | ICD-10-CM | POA: Diagnosis not present

## 2015-02-07 DIAGNOSIS — Z471 Aftercare following joint replacement surgery: Secondary | ICD-10-CM | POA: Diagnosis not present

## 2015-02-07 DIAGNOSIS — I10 Essential (primary) hypertension: Secondary | ICD-10-CM | POA: Diagnosis not present

## 2015-02-07 DIAGNOSIS — G8929 Other chronic pain: Secondary | ICD-10-CM | POA: Diagnosis not present

## 2015-02-08 DIAGNOSIS — G8929 Other chronic pain: Secondary | ICD-10-CM | POA: Diagnosis not present

## 2015-02-08 DIAGNOSIS — F0390 Unspecified dementia without behavioral disturbance: Secondary | ICD-10-CM | POA: Diagnosis not present

## 2015-02-08 DIAGNOSIS — I10 Essential (primary) hypertension: Secondary | ICD-10-CM | POA: Diagnosis not present

## 2015-02-08 DIAGNOSIS — I481 Persistent atrial fibrillation: Secondary | ICD-10-CM | POA: Diagnosis not present

## 2015-02-08 DIAGNOSIS — Z471 Aftercare following joint replacement surgery: Secondary | ICD-10-CM | POA: Diagnosis not present

## 2015-02-08 DIAGNOSIS — M159 Polyosteoarthritis, unspecified: Secondary | ICD-10-CM | POA: Diagnosis not present

## 2015-02-10 DIAGNOSIS — I10 Essential (primary) hypertension: Secondary | ICD-10-CM | POA: Diagnosis not present

## 2015-02-10 DIAGNOSIS — F0390 Unspecified dementia without behavioral disturbance: Secondary | ICD-10-CM | POA: Diagnosis not present

## 2015-02-10 DIAGNOSIS — Z471 Aftercare following joint replacement surgery: Secondary | ICD-10-CM | POA: Diagnosis not present

## 2015-02-10 DIAGNOSIS — I481 Persistent atrial fibrillation: Secondary | ICD-10-CM | POA: Diagnosis not present

## 2015-02-10 DIAGNOSIS — G8929 Other chronic pain: Secondary | ICD-10-CM | POA: Diagnosis not present

## 2015-02-10 DIAGNOSIS — M159 Polyosteoarthritis, unspecified: Secondary | ICD-10-CM | POA: Diagnosis not present

## 2015-02-13 ENCOUNTER — Encounter: Payer: Self-pay | Admitting: Cardiology

## 2015-02-13 ENCOUNTER — Ambulatory Visit (INDEPENDENT_AMBULATORY_CARE_PROVIDER_SITE_OTHER): Payer: Medicare Other | Admitting: Cardiology

## 2015-02-13 VITALS — BP 110/78 | HR 100 | Ht 70.0 in | Wt 206.0 lb

## 2015-02-13 DIAGNOSIS — I10 Essential (primary) hypertension: Secondary | ICD-10-CM

## 2015-02-13 DIAGNOSIS — D649 Anemia, unspecified: Secondary | ICD-10-CM

## 2015-02-13 DIAGNOSIS — R0902 Hypoxemia: Secondary | ICD-10-CM

## 2015-02-13 DIAGNOSIS — I481 Persistent atrial fibrillation: Secondary | ICD-10-CM | POA: Diagnosis not present

## 2015-02-13 DIAGNOSIS — I4819 Other persistent atrial fibrillation: Secondary | ICD-10-CM

## 2015-02-13 LAB — BASIC METABOLIC PANEL
BUN: 11 mg/dL (ref 7–25)
CALCIUM: 8.8 mg/dL (ref 8.6–10.3)
CO2: 32 mmol/L — AB (ref 20–31)
Chloride: 101 mmol/L (ref 98–110)
Creat: 0.81 mg/dL (ref 0.70–1.11)
GLUCOSE: 152 mg/dL — AB (ref 65–99)
POTASSIUM: 3.8 mmol/L (ref 3.5–5.3)
Sodium: 142 mmol/L (ref 135–146)

## 2015-02-13 LAB — CBC
HEMATOCRIT: 34.3 % — AB (ref 39.0–52.0)
HEMOGLOBIN: 11.4 g/dL — AB (ref 13.0–17.0)
MCH: 29.2 pg (ref 26.0–34.0)
MCHC: 33.2 g/dL (ref 30.0–36.0)
MCV: 87.7 fL (ref 78.0–100.0)
MPV: 10 fL (ref 8.6–12.4)
Platelets: 259 10*3/uL (ref 150–400)
RBC: 3.91 MIL/uL — AB (ref 4.22–5.81)
RDW: 14.3 % (ref 11.5–15.5)
WBC: 9.5 10*3/uL (ref 4.0–10.5)

## 2015-02-13 MED ORDER — FUROSEMIDE 20 MG PO TABS: 20.0000 mg | ORAL_TABLET | Freq: Every day | ORAL | Status: DC

## 2015-02-13 MED ORDER — DILTIAZEM HCL ER COATED BEADS 240 MG PO CP24: 240.0000 mg | ORAL_CAPSULE | Freq: Every day | ORAL | Status: DC

## 2015-02-13 NOTE — Progress Notes (Signed)
Cardiology Office Note   Date:  02/13/2015   ID:  Mark Wilkinson, DOB [birth year], MRN [id number]  PCP:  Redge Gainer, MD  Cardiologist:  Dr.  Percival Spanish  Chief Complaint  Patient presents with  . Hospitalization Follow-up    a little light headed   . Shortness of Breath    a little   . Edema    left knee       History of Present Illness: Mark Wilkinson is a 81 y.o. male who presents for follow up of a fib. He has been in a fib since 12/09/14 at least.  It had been rate controlled but with surgery with total knee replacement he developed rapid HR and SOB.  He was admitted 01/23/15 for rapid a fib and SOB.  IV dilt slowed the rate- also with acute on chronic diastolic HF.   Chads 2 vasc score is 3, continued Xarelto.    Since discharge his wt is down 6 lbs.  His appetite is stable, he has completed home PT and will begin outpt PT.  He has been anemic and is on Iron.  He is on lasix 40 daily, and dilt at 240 and tenormin.  He has some lightheadedness at times but mostly knee pain.  He was discharged with home oxygen.  Pt does have dementia but wife and son here today and are able to answer questions.  No chest pain.  They would like to decrease oxygen use.    Past Medical History  Diagnosis Date  . Essential hypertension, benign   . Other and unspecified hyperlipidemia   . Diaphragmatic hernia without mention of obstruction or gangrene   . Gout, unspecified   . Calculus of kidney   . Asymptomatic varicose veins   . Lumbago   . PONV (postoperative nausea and vomiting)   . Dementia   . GERD (gastroesophageal reflux disease)   . Arthritis     "knees, hips, back" (03/18/2013)  . Chronic lower back pain   . Atrial fibrillation   . Dysrhythmia     a-fib  . History of hiatal hernia     Past Surgical History  Procedure Laterality Date  . Appendectomy    . Cholecystectomy    . Cystoscopy with retrograde pyelogram, ureteroscopy and stent placement Left 08/13/2012    Procedure:  [place], URETEROSCOPY AND STENT PLACEMENT;  Surgeon: Alexis Frock, MD;  Location: [facility];  Service: Urology;  Laterality: Left;  . Cataract extraction w/ intraocular lens implant Left 2000's  . Total knee arthroplasty Right 12/08/2012    Procedure: RIGHT TOTAL KNEE ARTHROPLASTY;  Surgeon: [name], MD;  Location: [facility];  Service: Orthopedics;  Laterality: Right;  . Tonsillectomy and adenoidectomy      "no tubes" (03/18/2013)  . Toe amputation Right ?1970's    "shot his toe off in hunting accident" (03/18/2013)  . Cystoscopy w/ stone manipulation      "twice" (03/18/2013)  . Total knee arthroplasty Left 01/16/2015    Procedure: TOTAL LEFT KNEE ARTHROPLASTY;  Surgeon: Paralee Cancel, MD;  Location: [facility];  Service: Orthopedics;  Laterality: Left;     Current Outpatient Prescriptions  Medication Sig Dispense Refill  . acetaminophen (TYLENOL) 500 MG tablet Take 1 tablet (500 mg total) by mouth every 6 (six) hours as needed. 30 tablet 0  . allopurinol (ZYLOPRIM) 300 MG tablet TAKE 1 TABLET DAILY 90 tablet 0  . atenolol (TENORMIN) 50 MG tablet Take 1  tablet (50 mg total) by mouth 2 (two) times daily. (Patient taking differently: Take 50 mg by mouth 2 (two) times daily. Pt also takes 25 mg at night) 90 tablet 3  . atorvastatin (LIPITOR) 40 MG tablet TAKE 1 TABLET BY MOUTH EVERY DAY (Patient taking differently: TAKE 1 TABLET BY MOUTH NIGHTLY.) 90 tablet 0  . B Complex-C (SUPER B COMPLEX PO) Take 1 tablet by mouth at bedtime.     . Cholecalciferol (VITAMIN D3) 1000 UNITS CAPS Take 2,000 Units by mouth every morning.     . diltiazem (CARDIZEM CD) 240 MG 24 hr capsule Take 1 capsule (240 mg total) by mouth daily. 30 capsule 0  . docusate sodium (COLACE) 100 MG capsule Take 1 capsule (100 mg total) by mouth 2 (two) times daily. 10 capsule 0  . esomeprazole (NEXIUM) 40 MG capsule TAKE 1 CAPSULE DAILY 90 capsule 1  . ferrous sulfate 325 (65 FE) MG  tablet Take 1 tablet (325 mg total) by mouth 3 (three) times daily after meals.  3  . furosemide (LASIX) 40 MG tablet Take 1 tablet (40 mg total) by mouth daily. 30 tablet 0  . HYDROcodone-acetaminophen (NORCO) 7.5-325 MG per tablet Take 1-2 tablets by mouth every 4 (four) hours as needed for moderate pain. 100 tablet 0  . Multiple Vitamin (MULTIVITAMIN WITH MINERALS) TABS Take 1 tablet by mouth every morning.     . niacin (NIASPAN) 500 MG CR tablet TAKE 3 TABLETS BY MOUTH AT BEDTIME (Patient taking differently: TAKE 2 TABLETS BY MOUTH AT BEDTIME) 270 tablet 1  . Omega-3 Fatty Acids (FISH OIL) 1000 MG CAPS Take 1,000 mg by mouth 2 (two) times daily.     . polyethylene glycol (MIRALAX / GLYCOLAX) packet Take 17 g by mouth 2 (two) times daily. 14 each 0  . Ranibizumab (LUCENTIS) 0.3 MG/0.05ML SOLN Place 1 application into the left eye. Injection q 6-8 weeks    . RAPAFLO 8 MG CAPS capsule TAKE 1 CAPSULE DAILY WITH FOOD. (Patient taking differently: TAKE 1 CAPSULE BY MOUTH NIGHTLY.) 90 capsule 0  . rivaroxaban (XARELTO) 20 MG TABS tablet Take 1 tablet (20 mg total) by mouth daily. 30 tablet    No current facility-administered medications for this visit.    Allergies:   Penicillins    Social History:  The patient  reports that he quit smoking about 54 years ago. His smoking use included Cigarettes. He has a 1.2 pack-year smoking history. He has never used smokeless tobacco. He reports that he does not drink alcohol or use illicit drugs.   Family History:  The patient's family history includes CAD in his brother; Heart disease in his mother; Hypertension in his mother; Stroke in his father.    ROS:  General:no colds or fevers,  weight down 6 lbs from 01/23/15  Skin:no rashes or ulcers HEENT:no blurred vision, no congestion CV:see HPI PUL:see HPI GI:no diarrhea constipation or melena, no indigestion GU:no hematuria, no dysuria MS:+ knee joint pain, no claudication Neuro:no syncope, mild  lightheadedness Endo:no diabetes, no thyroid disease  Wt Readings from Last 3 Encounters:  02/13/15 206 lb (93.441 kg)  01/27/15 210 lb 11.2 oz (95.573 kg)  01/23/15 212 lb (96.163 kg)     PHYSICAL EXAM: VS:  BP 110/78 mmHg  Pulse 100  Ht 5\' 10"  (1.778 m)  Wt 206 lb (93.441 kg)  BMI 29.56 kg/m2 , BMI Body mass index is 29.56 kg/(m^2). General:Pleasant affect, NAD Skin:Warm and dry, brisk capillary refill HEENT:normocephalic, sclera  clear, mucus membranes moist Neck:supple, no JVD, no bruits  Heart:irreg irreg without murmur, gallup, rub or click Lungs:clear without rales, rhonchi, or wheezes VOP:FYTW, non tender, + BS, do not palpate liver spleen or masses Ext:no lower ext edema, 2+ pedal pulses, 2+ radial pulses Neuro:alert and oriented X 3 with poor memory, MAE, follows commands, + facial symmetry  sp02 with oxygen 98% at rest sp02 with ambulation RA 94% but short distance due to knee pain. HR with exertion 94-114.  EKG:  EKG is NOT ordered today.    Recent Labs: 01/24/2015: ALT 17; B Natriuretic Peptide 538.6*; TSH 1.338 01/25/2015: Hemoglobin 9.8*; Platelets 189 01/27/2015: BUN 19; Creatinine, Ser 1.22; Potassium 4.1; Sodium 138    Lipid Panel    Component Value Date/Time   CHOL 122 10/25/2014 1537   TRIG 66 10/25/2014 1537   TRIG 67 03/19/2013 0612   HDL 49 10/25/2014 1537   HDL 48 03/19/2013 0612   CHOLHDL 2.4 03/19/2013 0612   VLDL 13 03/19/2013 0612   LDLCALC 53 02/08/2014 0819   LDLCALC 56 03/19/2013 0612       Other studies Reviewed: Additional studies/ records that were reviewed today include: previous OV notes, hospital notes.  Echo: 01/25/15 Study Conclusions  - Left ventricle: The cavity size was normal. Wall thickness was increased in a pattern of mild LVH. Systolic function was normal. The estimated ejection fraction was in the range of 55% to 65%. Wall motion was normal; there were no regional wall motion abnormalities. - Aortic  valve: Mildly calcified annulus. - Mitral valve: There was mild regurgitation. - Left atrium: The atrium was mildly dilated   ASSESSMENT AND PLAN:  1.  Atrial fib persistent  rate with improved control.   On Xarelto CHA2DS2VASc score of 3.  He will follow up with Dr. Vita Barley in 3-4 weeks.  2. Anemia- on Iron, will rechec CBC today denies blood in his stools or urine.  3. Hypoxia on home oxygen-  With ambulation on RA today spo2 was 94% but he could not ambulate a long distance due to arthritis.  If resting in chair ok not to wear oxygen, but with exertion and at night wear oxygen.   4. Volume overload post op and with a fib has resolved.  Will decrease lasix to 20 mg daily, check BMP today as well.     Current medicines are reviewed with the patient today.  The patient Has no concerns regarding medicines.  The following changes have been made:  See above Labs/ tests ordered today include:see above  Disposition:   FU:  see above  Signed, Isaiah Serge, NP  02/13/2015 11:18 AM    [address], [place], [place], [place] Phone: [phone]; Fax: [phone]

## 2015-02-13 NOTE — Patient Instructions (Signed)
Your physician has recommended you make the following change in your medication: the furosemide has been decreased to 20 mg daily. A new prescription has been sent to your  Pharmacy to reflect this change.  Your physician recommends that you return for lab work today.  Your physician recommends that you schedule a follow-up appointment in: 3-4 weeks with Dr. Percival Spanish.

## 2015-02-14 ENCOUNTER — Ambulatory Visit: Payer: Medicare Other | Attending: Orthopedic Surgery | Admitting: Physical Therapy

## 2015-02-14 DIAGNOSIS — M25662 Stiffness of left knee, not elsewhere classified: Secondary | ICD-10-CM | POA: Diagnosis not present

## 2015-02-14 DIAGNOSIS — M25562 Pain in left knee: Secondary | ICD-10-CM | POA: Diagnosis not present

## 2015-02-14 NOTE — Therapy (Signed)
[facility] [place], [place], [address] Phone: [phone]   Fax:  [phone]  Physical Therapy Evaluation  Patient Details  Name: Mark Wilkinson MRN: [id number] Date of Birth: Nov 21, 1927 Referring Provider:  Paralee Cancel, MD  Encounter Date: 02/14/2015      PT End of Session - 02/14/15 1243    Visit Number 1   Number of Visits 12   Date for PT Re-Evaluation 03/28/15   PT Start Time 0951   PT Stop Time 1038   PT Time Calculation (min) 47 min   Equipment Utilized During Treatment Gait belt;Oxygen   Activity Tolerance Patient tolerated treatment well   Behavior During Therapy [facility] for tasks assessed/performed      Past Medical History  Diagnosis Date  . Essential hypertension, benign   . Other and unspecified hyperlipidemia   . Diaphragmatic hernia without mention of obstruction or gangrene   . Gout, unspecified   . Calculus of kidney   . Asymptomatic varicose veins   . Lumbago   . PONV (postoperative nausea and vomiting)   . Dementia   . GERD (gastroesophageal reflux disease)   . Arthritis     "knees, hips, back" (03/18/2013)  . Chronic lower back pain   . Atrial fibrillation   . Dysrhythmia     a-fib  . History of hiatal hernia     Past Surgical History  Procedure Laterality Date  . Appendectomy    . Cholecystectomy    . Cystoscopy with retrograde pyelogram, ureteroscopy and stent placement Left 08/13/2012    Procedure: [place], URETEROSCOPY AND STENT PLACEMENT;  Surgeon: Alexis Frock, MD;  Location: [facility];  Service: Urology;  Laterality: Left;  . Cataract extraction w/ intraocular lens implant Left 2000's  . Total knee arthroplasty Right 12/08/2012    Procedure: RIGHT TOTAL KNEE ARTHROPLASTY;  Surgeon: [name], MD;  Location: [facility];  Service: Orthopedics;  Laterality: Right;  . Tonsillectomy and adenoidectomy      "no tubes" (03/18/2013)  . Toe  amputation Right ?1970's    "shot his toe off in hunting accident" (03/18/2013)  . Cystoscopy w/ stone manipulation      "twice" (03/18/2013)  . Total knee arthroplasty Left 01/16/2015    Procedure: TOTAL LEFT KNEE ARTHROPLASTY;  Surgeon: Paralee Cancel, MD;  Location: [facility];  Service: Orthopedics;  Laterality: Left;    There were no vitals filed for this visit.  Visit Diagnosis:  Left knee pain - Plan: PT plan of care cert/re-cert  Knee stiffness, left - Plan: PT plan of care cert/re-cert      Subjective Assessment - 02/14/15 1009    Subjective I had left knee surgery but had to go back into hospital due to fluid in lungs.  Patient on portable 02 at 2 L/Min.   Limitations Walking   How long can you walk comfortably? 10 minutes.   Patient Stated Goals Get my knee better.   Pain Score 4    Pain Location Knee   Pain Orientation Left   Pain Descriptors / Indicators Aching   Pain Type Surgical pain            [facility] PT Assessment - 02/14/15 0001    Assessment   Medical Diagnosis Left total knee replacement.   Onset Date/Surgical Date --  01/16/15 (surgery date).   Precautions   Precautions --  No ultrasound.  Portable 02 2L/min.   Precaution Comments --  Constant supervised  gait for safety.   Restrictions   Weight Bearing Restrictions No   Balance Screen   Has the patient fallen in the past 6 months Yes   How many times? 1   Has the patient had a decrease in activity level because of a fear of falling?  No   Is the patient reluctant to leave their home because of a fear of falling?  No   Home [employer] residence   Prior Function   Level of Independence Independent   Cognition   Memory Impaired   Observation/Other Assessments-Edema    Edema Circumferential   Circumferential Edema   Circumferential - Left  Left 3 cms > right.   ROM / Strength   AROM / PROM / Strength AROM;PROM;Strength   AROM   Overall AROM Comments Left knee -15  degrees to 110 degrees.   PROM   Overall PROM Comments -11 degrees to 114 degrees.   Strength   Overall Strength Comments Left hip and knee strength= 4 to 4+/5 and a minimal decrease in left quadriceps activation.   Palpation   Palpation comment Diffuse anterior left knee pain.   Ambulation/Gait   Gait Comments Patient using a straight cane and walks in flexion and requires supervision at all times.  He was using a FWW in home health but was not able to "keep up with it" as he let the walker get too far in front of him.                   [facility] Adult PT Treatment/Exercise - 02/14/15 0001    Modalities   Modalities Electrical Stimulation;Vasopneumatic   Electrical Stimulation   Electrical Stimulation Location IFC at 1-10 HZ x 15 minutes to patient's left knee.   Electrical Stimulation Goals Edema;Pain                  PT Short Term Goals - 02/14/15 1442    PT SHORT TERM GOAL #1   Title Ind with an HEP.   Time 2   Period Weeks   Status New   PT SHORT TERM GOAL #2   Title Full active left knee extension to help normalize gait pattern.           PT Long Term Goals - 02/14/15 1444    PT LONG TERM GOAL #1   Title Active left knee flexion= 120-125 degrees.   Time 4   Period Weeks   Status New   PT LONG TERM GOAL #2   Title 5/5 left LE strength to increase stability for accomplishment of functional tasks.   Time 4   Period Weeks   Status New   PT LONG TERM GOAL #3   Title Perform ADL's with pain not > 3/10.   Time 4   Period Weeks   Status New               Plan - 02/14/15 1252    Clinical Impression Statement The patient underwent a left total knee replacement on 01/16/15.  He had an extended hospital stay due to aspiration which the wife feels that may have set him back a bit.  His left knee pain-level is a 4/10 today.  He is on portable 02.  is wife was prsent during the evaluation to help answer questions.   Pt will benefit from skilled  therapeutic intervention in order to improve on the following deficits Abnormal gait;Decreased range of motion;Decreased strength;Increased edema;Pain  Rehab Potential Excellent   PT Frequency 3x / week   PT Duration 4 weeks   PT Treatment/Interventions ADLs/Self Care Home Management;Cryotherapy;Advice worker;Therapeutic activities;Therapeutic exercise;Neuromuscular re-education;Patient/family education;Manual techniques;Passive range of motion;Visual/perceptual remediation/compensation   PT Next Visit Plan Total knee protocol.  Electrical stimulation and vasopneumatic.          G-Codes - February 27, 2015 1448    Functional Assessment Tool Used FOTO.   Functional Limitation Mobility: Walking and moving around   Mobility: Walking and Moving Around Current Status [phone]) At least 60 percent but less than 80 percent impaired, limited or restricted   Mobility: Walking and Moving Around Goal Status [phone]) At least 20 percent but less than 40 percent impaired, limited or restricted       Problem List Patient Active Problem List   Diagnosis Date Noted  . Chronic diastolic CHF (congestive heart failure) 01/24/2015  . Atrial fibrillation with RVR 01/23/2015  . Shortness of breath 01/23/2015  . Physical deconditioning 01/23/2015  . Dementia 01/23/2015  . Essential hypertension 01/23/2015  . Diastolic CHF 19/37/9024  . S/P left TKA 01/16/2015  . Atrial fibrillation, persistent 02/25/2014  . Metabolic syndrome 09/73/5329  . BPH (benign prostatic hyperplasia) 06/16/2013  . Chest pain 03/18/2013  . Acute encephalopathy 12/11/2012  . Hypotension, unspecified 12/11/2012  . Elevated troponin 12/11/2012  . Expected blood loss anemia 12/09/2012  . Obese 12/09/2012  . S/P right TKA 12/08/2012  . Preop cardiovascular exam 11/25/2012  . Essential hypertension, benign   . Other and unspecified hyperlipidemia   . Diaphragmatic hernia without mention of obstruction or gangrene    . Gout, unspecified   . Calculus of kidney   . [place], [place]   . Asymptomatic varicose veins   . Lumbago     APP[place], [place] MPT Feb 27, 2015, 2:52 PM  [facility] [address] [place], [place], [address] Phone: [phone]   Fax:  [phone]

## 2015-02-16 ENCOUNTER — Ambulatory Visit: Payer: Medicare Other | Admitting: Physical Therapy

## 2015-02-16 ENCOUNTER — Encounter: Payer: Self-pay | Admitting: Physical Therapy

## 2015-02-16 DIAGNOSIS — M25562 Pain in left knee: Secondary | ICD-10-CM | POA: Diagnosis not present

## 2015-02-16 DIAGNOSIS — M25662 Stiffness of left knee, not elsewhere classified: Secondary | ICD-10-CM | POA: Diagnosis not present

## 2015-02-16 NOTE — Therapy (Signed)
[facility] [address] [place], [place], [address] Phone: [phone]   Fax:  [phone]  Physical Therapy Treatment  Patient Details  Name: Mark Wilkinson MRN: [id number] Date of Birth: February 20, 1928 Referring Provider:  Ernestina Penna, MD  Encounter Date: 02/16/2015      PT End of Session - 02/16/15 0855    Visit Number 2   Number of Visits 12   Date for PT Re-Evaluation 03/28/15   PT Start Time 0813   PT Stop Time 0910   PT Time Calculation (min) 57 min   Activity Tolerance Patient tolerated treatment well   Behavior During Therapy [facility] for tasks assessed/performed      Past Medical History  Diagnosis Date  . Essential hypertension, benign   . Other and unspecified hyperlipidemia   . Diaphragmatic hernia without mention of obstruction or gangrene   . Gout, unspecified   . Calculus of kidney   . Asymptomatic varicose veins   . Lumbago   . PONV (postoperative nausea and vomiting)   . Dementia   . GERD (gastroesophageal reflux disease)   . Arthritis     "knees, hips, back" (03/18/2013)  . Chronic lower back pain   . Atrial fibrillation   . Dysrhythmia     a-fib  . History of hiatal hernia     Past Surgical History  Procedure Laterality Date  . Appendectomy    . Cholecystectomy    . Cystoscopy with retrograde pyelogram, ureteroscopy and stent placement Left 08/13/2012    Procedure: CYSTOSCOPY WITH RETROGRADE PYELOGRAM, URETEROSCOPY AND STENT PLACEMENT;  Surgeon: Sebastian Ache, MD;  Location: [facility];  Service: Urology;  Laterality: Left;  . Cataract extraction w/ intraocular lens implant Left 2000's  . Total knee arthroplasty Right 12/08/2012    Procedure: RIGHT TOTAL KNEE ARTHROPLASTY;  Surgeon: Shelda Pal, MD;  Location: [facility];  Service: Orthopedics;  Laterality: Right;  . Tonsillectomy and adenoidectomy      "no tubes" (03/18/2013)  . Toe amputation Right ?1970's    "shot his toe off in  hunting accident" (03/18/2013)  . Cystoscopy w/ stone manipulation      "twice" (03/18/2013)  . Total knee arthroplasty Left 01/16/2015    Procedure: TOTAL LEFT KNEE ARTHROPLASTY;  Surgeon: Durene Romans, MD;  Location: [facility];  Service: Orthopedics;  Laterality: Left;    There were no vitals filed for this visit.  Visit Diagnosis:  Left knee pain  Knee stiffness, left      Subjective Assessment - 02/16/15 0818    Subjective left knee sore today per patient    Patient is accompained by: Family member   Limitations Walking   How long can you walk comfortably? 10 minutes.   Patient Stated Goals Get my knee better.   Currently in Pain? Yes   Pain Score 4    Pain Location Knee   Pain Orientation Left   Pain Descriptors / Indicators Aching   Pain Type Surgical pain   Pain Onset More than a month ago   Pain Frequency Constant   Aggravating Factors  prolong activity   Pain Relieving Factors rest            [facility] PT Assessment - 02/16/15 0001    ROM / Strength   AROM / PROM / Strength AROM;PROM   AROM   Overall AROM  Deficits   AROM Assessment Site Knee   Right/Left Knee Left   Left Knee Extension -15  Left Knee Flexion 115   PROM   Overall PROM  Deficits   PROM Assessment Site Knee   Right/Left Knee Left   Left Knee Extension -10   Left Knee Flexion 120                     [facility] Adult PT Treatment/Exercise - 02/16/15 0001    Exercises   Exercises Knee/Hip   Knee/Hip Exercises: Aerobic   Nustep 69min L3 monitored for progression   Knee/Hip Exercises: Seated   Long Arc Quad Strengthening;Left;3 sets;10 reps   Long Arc Quad Weight 2 lbs.   Knee/Hip Exercises: Supine   Short Arc Quad Sets Strengthening;Left;10 reps;2 sets  0#   Straight Leg Raises Strengthening;Left;10 reps;AAROM;2 sets   Acupuncturist Stimulation Location left knee   Electrical Stimulation Action IFC   Electrical Stimulation Parameters 1-10HZ    Electrical  Stimulation Goals Edema;Pain   Vasopneumatic   Number Minutes Vasopneumatic  15 minutes   Vasopnuematic Location  Knee   Vasopneumatic Pressure Medium   Manual Therapy   Manual Therapy Passive ROM   Passive ROM low load holds for flexion/ext                PT Education - 02/16/15 0844    Education provided Yes   Education Details HEP flex/ext Terrence Dupont    Person(s) Educated Patient;Spouse   Methods Explanation;Demonstration;Handout   Comprehension Verbalized understanding;Returned demonstration          PT Short Term Goals - 02/16/15 0841    PT SHORT TERM GOAL #1   Title Ind with an HEP.   Time 2   Period Weeks   Status Achieved   PT SHORT TERM GOAL #2   Title Full active left knee extension to help normalize gait pattern.   Status On-going           PT Long Term Goals - 02/14/15 1444    PT LONG TERM GOAL #1   Title Active left knee flexion= 120-125 degrees.   Time 4   Period Weeks   Status New   PT LONG TERM GOAL #2   Title 5/5 left LE strength to increase stability for accomplishment of functional tasks.   Time 4   Period Weeks   Status New   PT LONG TERM GOAL #3   Title Perform ADL's with pain not > 3/10.   Time 4   Period Weeks   Status New               Plan - 02/16/15 2979    Clinical Impression Statement Patient progressing with activities. Patient requires SBA for safety,  patient using SPC and has oxygen. Patient requires assistance with strengthening exercises. Patient and wife were educated on safety first and perform exercises to help improve strength and functional independence. Patient and wife were educated also on [place]. Met STG #1 other goals ongoing due to pain, ROM, and strength limitations.    Pt will benefit from skilled therapeutic intervention in order to improve on the following deficits Abnormal gait;Decreased range of motion;Decreased strength;Increased edema;Pain   Rehab Potential Excellent   PT Frequency 3x /  week   PT Duration 4 weeks   PT Treatment/Interventions ADLs/Self Care Home Management;Cryotherapy;Advice worker;Therapeutic activities;Therapeutic exercise;Neuromuscular re-education;Patient/family education;Manual techniques;Passive range of motion;Visual/perceptual remediation/compensation   PT Next Visit Plan Total knee protocol.  Electrical stimulation and vasopneumatic.   Consulted and Agree with Plan of Care Patient;Family member/caregiver  Problem List Patient Active Problem List   Diagnosis Date Noted  . Chronic diastolic CHF (congestive heart failure) 01/24/2015  . Atrial fibrillation with RVR 01/23/2015  . Shortness of breath 01/23/2015  . Physical deconditioning 01/23/2015  . Dementia 01/23/2015  . Essential hypertension 01/23/2015  . Diastolic CHF 17/51/0258  . S/P left TKA 01/16/2015  . Atrial fibrillation, persistent 02/25/2014  . Metabolic syndrome 52/77/8242  . BPH (benign prostatic hyperplasia) 06/16/2013  . Chest pain 03/18/2013  . Acute encephalopathy 12/11/2012  . Hypotension, unspecified 12/11/2012  . Elevated troponin 12/11/2012  . Expected blood loss anemia 12/09/2012  . Obese 12/09/2012  . S/P right TKA 12/08/2012  . Preop cardiovascular exam 11/25/2012  . Essential hypertension, benign   . Other and unspecified hyperlipidemia   . Diaphragmatic hernia without mention of obstruction or gangrene   . Gout, unspecified   . Calculus of kidney   . [place], [place]   . Asymptomatic varicose veins   . Lumbago     Phillips Climes, PTA 02/16/2015, 9:14 AM  [facility] [address] [place], [place], [address] Phone: [phone]   Fax:  [phone]

## 2015-02-16 NOTE — Patient Instructions (Signed)
  Straight Leg Raise   Tighten stomach and slowly raise locked right leg __4__ inches from floor. Repeat __10-30__ times per set. Do __2__ sets per session. Do __2__ sessions per day.  Knee Extension Mobilization: Towel Prop   With rolled towel under right ankle, place _1-5___ pound weight across knee. Hold __5+__ minutes. Repeat __2-3__ times per set. Do __2__ sets per session. Do __2-4__ sessions per day.   Heel Slide   Bend left knee and pull heel toward buttocks. Use strap around foot and pull strap with arms to assist knee to bend further. Hold 10 secs.  Repeat ____ times. Do ____ sessions per day.

## 2015-02-20 ENCOUNTER — Encounter: Payer: Self-pay | Admitting: Physical Therapy

## 2015-02-20 ENCOUNTER — Ambulatory Visit: Payer: Medicare Other | Admitting: Physical Therapy

## 2015-02-20 DIAGNOSIS — M25562 Pain in left knee: Secondary | ICD-10-CM | POA: Diagnosis not present

## 2015-02-20 DIAGNOSIS — M25662 Stiffness of left knee, not elsewhere classified: Secondary | ICD-10-CM

## 2015-02-20 NOTE — Therapy (Signed)
[facility] [place], [place], [address] Phone: [phone]   Fax:  [phone]  Physical Therapy Treatment  Patient Details  Name: Mark Wilkinson MRN: [id number] Date of Birth: [birth year] Referring Provider:  Chipper Herb, MD  Encounter Date: 02/20/2015      PT End of Session - 02/20/15 1008    Visit Number 3   Number of Visits 12   Date for PT Re-Evaluation 03/28/15   PT Start Time 0945   PT Stop Time 3976   PT Time Calculation (min) 56 min   Activity Tolerance Patient tolerated treatment well   Behavior During Therapy [facility] for tasks assessed/performed      Past Medical History  Diagnosis Date  . Essential hypertension, benign   . Other and unspecified hyperlipidemia   . Diaphragmatic hernia without mention of obstruction or gangrene   . Gout, unspecified   . Calculus of kidney   . Asymptomatic varicose veins   . Lumbago   . PONV (postoperative nausea and vomiting)   . Dementia   . GERD (gastroesophageal reflux disease)   . Arthritis     "knees, hips, back" (03/18/2013)  . Chronic lower back pain   . Atrial fibrillation   . Dysrhythmia     a-fib  . History of hiatal hernia     Past Surgical History  Procedure Laterality Date  . Appendectomy    . Cholecystectomy    . Cystoscopy with retrograde pyelogram, ureteroscopy and stent placement Left 08/13/2012    Procedure: [place], URETEROSCOPY AND STENT PLACEMENT;  Surgeon: Alexis Frock, MD;  Location: [facility];  Service: Urology;  Laterality: Left;  . Cataract extraction w/ intraocular lens implant Left 2000's  . Total knee arthroplasty Right 12/08/2012    Procedure: RIGHT TOTAL KNEE ARTHROPLASTY;  Surgeon: [name], MD;  Location: [facility];  Service: Orthopedics;  Laterality: Right;  . Tonsillectomy and adenoidectomy      "no tubes" (03/18/2013)  . Toe amputation Right ?1970's    "shot his toe off in  hunting accident" (03/18/2013)  . Cystoscopy w/ stone manipulation      "twice" (03/18/2013)  . Total knee arthroplasty Left 01/16/2015    Procedure: TOTAL LEFT KNEE ARTHROPLASTY;  Surgeon: Paralee Cancel, MD;  Location: [facility];  Service: Orthopedics;  Laterality: Left;    There were no vitals filed for this visit.  Visit Diagnosis:  Left knee pain  Knee stiffness, left      Subjective Assessment - 02/20/15 0953    Subjective forgot O2 tank today, knee a little sore per patient   Patient is accompained by: Family member   Limitations Walking   How long can you walk comfortably? 10 minutes.   Patient Stated Goals Get my knee better.   Pain Score 4    Pain Location Knee   Pain Orientation Left   Pain Descriptors / Indicators Aching   Pain Type Surgical pain   Pain Onset More than a month ago   Pain Frequency Constant   Aggravating Factors  prolong activity   Pain Relieving Factors rest            [facility] PT Assessment - 02/20/15 0001    AROM   Overall AROM  Deficits   AROM Assessment Site Knee   Right/Left Knee Left   Left Knee Extension -19   Left Knee Flexion 113   PROM   Overall PROM  Deficits   PROM  Assessment Site Knee   Right/Left Knee Left   Left Knee Extension -15   Left Knee Flexion 120                     [facility] Adult PT Treatment/Exercise - 02/20/15 0001    Knee/Hip Exercises: Aerobic   Nustep 32min L1 for ROM, monitored    Knee/Hip Exercises: Seated   Long Arc Quad Strengthening;Left;3 sets;10 reps   Long Arc Quad Weight 3 lbs.   Knee/Hip Exercises: Supine   Straight Leg Raises Strengthening;Left;10 reps;AAROM;2 sets   Electrical Stimulation   Electrical Stimulation Location left knee   Electrical Stimulation Action IFC   Electrical Stimulation Parameters 1-10HZ    Electrical Stimulation Goals Edema;Pain   Vasopneumatic   Number Minutes Vasopneumatic  15 minutes   Vasopnuematic Location  Knee   Vasopneumatic Pressure Medium   Manual  Therapy   Manual Therapy Passive ROM   Passive ROM low load holds for flexion/ext                  PT Short Term Goals - 02/16/15 1610    PT SHORT TERM GOAL #1   Title Ind with an HEP.   Time 2   Period Weeks   Status Achieved   PT SHORT TERM GOAL #2   Title Full active left knee extension to help normalize gait pattern.   Status On-going           PT Long Term Goals - 02/14/15 1444    PT LONG TERM GOAL #1   Title Active left knee flexion= 120-125 degrees.   Time 4   Period Weeks   Status New   PT LONG TERM GOAL #2   Title 5/5 left LE strength to increase stability for accomplishment of functional tasks.   Time 4   Period Weeks   Status New   PT LONG TERM GOAL #3   Title Perform ADL's with pain not > 3/10.   Time 4   Period Weeks   Status New               Plan - 02/20/15 1026    Clinical Impression Statement Patient slowly progressing today with decresed AROM. Patient and wife reported more walking and less self stretching at home this past weekend. educated patient and wife on self stretching 3-4 times a day to improve ROM and funcyional independence. Patient able to perform low level strengthening due to being on O2. Goals ongoing due to pain, ROM, and strength deficits.,   Pt will benefit from skilled therapeutic intervention in order to improve on the following deficits Abnormal gait;Decreased range of motion;Decreased strength;Increased edema;Pain   Rehab Potential Excellent   PT Frequency 3x / week   PT Duration 4 weeks   PT Treatment/Interventions ADLs/Self Care Home Management;Cryotherapy;Advice worker;Therapeutic activities;Therapeutic exercise;Neuromuscular re-education;Patient/family education;Manual techniques;Passive range of motion;Visual/perceptual remediation/compensation   PT Next Visit Plan Total knee protocol.  Electrical stimulation and vasopneumatic. (October 12th Dr. Alvan Dame)   Consulted and Agree with Plan of  Care Patient;Family member/caregiver        Problem List Patient Active Problem List   Diagnosis Date Noted  . Chronic diastolic CHF (congestive heart failure) 01/24/2015  . Atrial fibrillation with RVR 01/23/2015  . Shortness of breath 01/23/2015  . Physical deconditioning 01/23/2015  . Dementia 01/23/2015  . Essential hypertension 01/23/2015  . Diastolic CHF 96/08/5407  . S/P left TKA 01/16/2015  . Atrial fibrillation, persistent 02/25/2014  . Metabolic  syndrome 10/11/2013  . BPH (benign prostatic hyperplasia) 06/16/2013  . Chest pain 03/18/2013  . Acute encephalopathy 12/11/2012  . Hypotension, unspecified 12/11/2012  . Elevated troponin 12/11/2012  . Expected blood loss anemia 12/09/2012  . Obese 12/09/2012  . S/P right TKA 12/08/2012  . Preop cardiovascular exam 11/25/2012  . Essential hypertension, benign   . Other and unspecified hyperlipidemia   . Diaphragmatic hernia without mention of obstruction or gangrene   . Gout, unspecified   . Calculus of kidney   . [place], [place]   . Asymptomatic varicose veins   . Lumbago     Phillips Climes, PTA 02/20/2015, 10:51 AM  [facility] [address] [place], [place], [address] Phone: [phone]   Fax:  [phone]

## 2015-02-22 ENCOUNTER — Ambulatory Visit: Payer: Medicare Other | Admitting: Physical Therapy

## 2015-02-22 ENCOUNTER — Encounter: Payer: Self-pay | Admitting: Physical Therapy

## 2015-02-22 DIAGNOSIS — M25662 Stiffness of left knee, not elsewhere classified: Secondary | ICD-10-CM | POA: Diagnosis not present

## 2015-02-22 DIAGNOSIS — M25562 Pain in left knee: Secondary | ICD-10-CM

## 2015-02-22 NOTE — Therapy (Signed)
[facility] [place], [place], [address] Phone: [phone]   Fax:  [phone]  Physical Therapy Treatment  Patient Details  Name: Mark Wilkinson MRN: [id number] Date of Birth: August 08, 1927 Referring Hether Anselmo:  Chipper Herb, MD  Encounter Date: 02/22/2015      PT End of Session - 02/22/15 0942    Visit Number 4   Number of Visits 12   Date for PT Re-Evaluation 03/28/15   PT Start Time 0859   PT Stop Time 0958   PT Time Calculation (min) 59 min   Equipment Utilized During Treatment Oxygen   Activity Tolerance Patient tolerated treatment well   Behavior During Therapy [facility] for tasks assessed/performed      Past Medical History  Diagnosis Date  . Essential hypertension, benign   . Other and unspecified hyperlipidemia   . Diaphragmatic hernia without mention of obstruction or gangrene   . Gout, unspecified   . Calculus of kidney   . Asymptomatic varicose veins   . Lumbago   . PONV (postoperative nausea and vomiting)   . Dementia   . GERD (gastroesophageal reflux disease)   . Arthritis     "knees, hips, back" (03/18/2013)  . Chronic lower back pain   . Atrial fibrillation   . Dysrhythmia     a-fib  . History of hiatal hernia     Past Surgical History  Procedure Laterality Date  . Appendectomy    . Cholecystectomy    . Cystoscopy with retrograde pyelogram, ureteroscopy and stent placement Left 08/13/2012    Procedure: [place], URETEROSCOPY AND STENT PLACEMENT;  Surgeon: Alexis Frock, MD;  Location: [facility];  Service: Urology;  Laterality: Left;  . Cataract extraction w/ intraocular lens implant Left 2000's  . Total knee arthroplasty Right 12/08/2012    Procedure: RIGHT TOTAL KNEE ARTHROPLASTY;  Surgeon: [name], MD;  Location: [facility];  Service: Orthopedics;  Laterality: Right;  . Tonsillectomy and adenoidectomy      "no tubes" (03/18/2013)  . Toe  amputation Right ?1970's    "shot his toe off in hunting accident" (03/18/2013)  . Cystoscopy w/ stone manipulation      "twice" (03/18/2013)  . Total knee arthroplasty Left 01/16/2015    Procedure: TOTAL LEFT KNEE ARTHROPLASTY;  Surgeon: Paralee Cancel, MD;  Location: [facility];  Service: Orthopedics;  Laterality: Left;    There were no vitals filed for this visit.  Visit Diagnosis:  Left knee pain  Knee stiffness, left      Subjective Assessment - 02/22/15 0909    Subjective Patient unable to sleep last night and knee continues to feel sore    Patient is accompained by: Family member   Limitations Walking   How long can you walk comfortably? 10 minutes.   Patient Stated Goals Get my knee better.   Currently in Pain? Yes   Pain Score 4    Pain Location Knee   Pain Orientation Left   Pain Descriptors / Indicators Aching   Pain Type Surgical pain   Pain Onset More than a month ago   Pain Frequency Constant   Aggravating Factors  standing, walking, or bending knee            [facility] PT Assessment - 02/22/15 0001    AROM   Overall AROM  Deficits   AROM Assessment Site Knee   Right/Left Knee Left   Left Knee Extension -14   Left Knee Flexion  114   PROM   Overall PROM  Deficits   PROM Assessment Site Knee   Right/Left Knee Left   Left Knee Extension -10   Left Knee Flexion 123                     [facility] Adult PT Treatment/Exercise - 02/22/15 0001    Knee/Hip Exercises: Aerobic   Nustep 70min L1 for ROM, monitored    Knee/Hip Exercises: Seated   Long Arc Quad Strengthening;Left;3 sets;10 reps   Long Arc Quad Weight 3 lbs.   Knee/Hip Exercises: Supine   Straight Leg Raises Strengthening;Left;10 reps;AAROM;2 sets   Electrical Stimulation   Electrical Stimulation Location left knee   Electrical Stimulation Action IFC   Electrical Stimulation Parameters 1-10HZ    Electrical Stimulation Goals Edema;Pain   Vasopneumatic   Number Minutes Vasopneumatic  15  minutes   Vasopnuematic Location  Knee   Vasopneumatic Pressure Medium   Manual Therapy   Manual Therapy Passive ROM   Passive ROM low load holds for flexion/ext                  PT Short Term Goals - 02/16/15 1829    PT SHORT TERM GOAL #1   Title Ind with an HEP.   Time 2   Period Weeks   Status Achieved   PT SHORT TERM GOAL #2   Title Full active left knee extension to help normalize gait pattern.   Status On-going           PT Long Term Goals - 02/14/15 1444    PT LONG TERM GOAL #1   Title Active left knee flexion= 120-125 degrees.   Time 4   Period Weeks   Status New   PT LONG TERM GOAL #2   Title 5/5 left LE strength to increase stability for accomplishment of functional tasks.   Time 4   Period Weeks   Status New   PT LONG TERM GOAL #3   Title Perform ADL's with pain not > 3/10.   Time 4   Period Weeks   Status New               Plan - 02/22/15 9371    Clinical Impression Statement Patient progressing with improved ROM today, no decrease in pain at this time. Patient has difficulty and weakness with left LE yet able to perform SLR independently with verbal cues. No further goals met due to ROM, pain and strength deficits.   Pt will benefit from skilled therapeutic intervention in order to improve on the following deficits Abnormal gait;Decreased range of motion;Decreased strength;Increased edema;Pain   Rehab Potential Excellent   PT Frequency 3x / week   PT Duration 4 weeks   PT Treatment/Interventions ADLs/Self Care Home Management;Cryotherapy;Advice worker;Therapeutic activities;Therapeutic exercise;Neuromuscular re-education;Patient/family education;Manual techniques;Passive range of motion;Visual/perceptual remediation/compensation   PT Next Visit Plan Total knee protocol.  Electrical stimulation and vasopneumatic. (October 12th Dr. Alvan Dame)   Consulted and Agree with Plan of Care Patient        Problem  List Patient Active Problem List   Diagnosis Date Noted  . Chronic diastolic CHF (congestive heart failure) 01/24/2015  . Atrial fibrillation with RVR 01/23/2015  . Shortness of breath 01/23/2015  . Physical deconditioning 01/23/2015  . Dementia 01/23/2015  . Essential hypertension 01/23/2015  . Diastolic CHF 69/67/8938  . S/P left TKA 01/16/2015  . Atrial fibrillation, persistent 02/25/2014  . Metabolic syndrome 03/12/5101  . BPH (benign prostatic  hyperplasia) 06/16/2013  . Chest pain 03/18/2013  . Acute encephalopathy 12/11/2012  . Hypotension, unspecified 12/11/2012  . Elevated troponin 12/11/2012  . Expected blood loss anemia 12/09/2012  . Obese 12/09/2012  . S/P right TKA 12/08/2012  . Preop cardiovascular exam 11/25/2012  . Essential hypertension, benign   . Other and unspecified hyperlipidemia   . Diaphragmatic hernia without mention of obstruction or gangrene   . Gout, unspecified   . Calculus of kidney   . [place]   . Asymptomatic varicose veins   . Lumbago     DUNFORD, CHRISTINA P, PTA 02/22/2015, 10:03 AM  [facility] [address] [place], [place], [address] Phone: [phone]   Fax:  [phone]

## 2015-02-24 ENCOUNTER — Encounter: Payer: Self-pay | Admitting: Physical Therapy

## 2015-02-24 ENCOUNTER — Ambulatory Visit: Payer: Medicare Other | Admitting: Physical Therapy

## 2015-02-24 DIAGNOSIS — M25662 Stiffness of left knee, not elsewhere classified: Secondary | ICD-10-CM

## 2015-02-24 DIAGNOSIS — M25562 Pain in left knee: Secondary | ICD-10-CM

## 2015-02-24 NOTE — Therapy (Signed)
[facility] [place], [place], [address] Phone: [phone]   Fax:  [phone]  Physical Therapy Treatment  Patient Details  Name: Mark Wilkinson MRN: [id number] Date of Birth: 24-Nov-1927 Referring Provider:  Chipper Herb, MD  Encounter Date: 02/24/2015      PT End of Session - 02/24/15 0916    Visit Number 5   Number of Visits 12   Date for PT Re-Evaluation 03/28/15   PT Start Time 0906   PT Stop Time 1001   PT Time Calculation (min) 55 min   Equipment Utilized During Treatment Oxygen   Activity Tolerance Patient tolerated treatment well   Behavior During Therapy [facility] for tasks assessed/performed      Past Medical History  Diagnosis Date  . Essential hypertension, benign   . Other and unspecified hyperlipidemia   . Diaphragmatic hernia without mention of obstruction or gangrene   . Gout, unspecified   . Calculus of kidney   . Asymptomatic varicose veins   . Lumbago   . PONV (postoperative nausea and vomiting)   . Dementia   . GERD (gastroesophageal reflux disease)   . Arthritis     "knees, hips, back" (03/18/2013)  . Chronic lower back pain   . Atrial fibrillation   . Dysrhythmia     a-fib  . History of hiatal hernia     Past Surgical History  Procedure Laterality Date  . Appendectomy    . Cholecystectomy    . Cystoscopy with retrograde pyelogram, ureteroscopy and stent placement Left 08/13/2012    Procedure: [place], URETEROSCOPY AND STENT PLACEMENT;  Surgeon: Alexis Frock, MD;  Location: [facility];  Service: Urology;  Laterality: Left;  . Cataract extraction w/ intraocular lens implant Left 2000's  . Total knee arthroplasty Right 12/08/2012    Procedure: RIGHT TOTAL KNEE ARTHROPLASTY;  Surgeon: [name], MD;  Location: [facility];  Service: Orthopedics;  Laterality: Right;  . Tonsillectomy and adenoidectomy      "no tubes" (03/18/2013)  . Toe  amputation Right ?1970's    "shot his toe off in hunting accident" (03/18/2013)  . Cystoscopy w/ stone manipulation      "twice" (03/18/2013)  . Total knee arthroplasty Left 01/16/2015    Procedure: TOTAL LEFT KNEE ARTHROPLASTY;  Surgeon: Paralee Cancel, MD;  Location: [facility];  Service: Orthopedics;  Laterality: Left;    There were no vitals filed for this visit.  Visit Diagnosis:  Left knee pain  Knee stiffness, left      Subjective Assessment - 02/24/15 0916    Subjective Reports not feeling too good today but pain isn't bad.   Patient is accompained by: Family member   Limitations Walking   How long can you walk comfortably? 10 minutes.   Patient Stated Goals Get my knee better.   Currently in Pain? Yes   Pain Score 5    Pain Location Knee   Pain Orientation Left   Pain Descriptors / Indicators Aching;Sore   Pain Type Surgical pain   Pain Onset More than a month ago            [facility] PT Assessment - 02/24/15 0001    Assessment   Medical Diagnosis Left total knee replacement.   Onset Date/Surgical Date 01/16/15   Next MD Visit 03/08/2015  Dr. Lindell Noe Adult PT  Treatment/Exercise - 02/24/15 0001    Knee/Hip Exercises: Aerobic   Nustep L5 x12 min, monitored   Knee/Hip Exercises: Seated   Long Arc Quad Strengthening;Left;3 sets;15 reps;Weights   Long Arc Quad Weight 3 lbs.   Knee/Hip Exercises: Supine   Heel Slides AROM;Left;2 sets;10 reps   Straight Leg Raises Strengthening;Left;2 sets;10 reps   Modalities   Modalities Designer, multimedia Location left knee   Electrical Stimulation Action IFC   Electrical Stimulation Parameters 1-10 Hz x15 min   Electrical Stimulation Goals Edema;Pain   Vasopneumatic   Number Minutes Vasopneumatic  15 minutes   Vasopnuematic Location  Knee   Vasopneumatic Pressure Medium   Vasopneumatic Temperature  34   Manual Therapy    Manual Therapy Passive ROM;Soft tissue mobilization   Soft tissue mobilization L knee incision mobilizations and L patellar mobilizations in supine   Passive ROM PROM of L knee into extension with gentle holds at end range and oscillatons for relaxation                  PT Short Term Goals - 02/16/15 0841    PT SHORT TERM GOAL #1   Title Ind with an HEP.   Time 2   Period Weeks   Status Achieved   PT SHORT TERM GOAL #2   Title Full active left knee extension to help normalize gait pattern.   Status On-going           PT Long Term Goals - 02/14/15 1444    PT LONG TERM GOAL #1   Title Active left knee flexion= 120-125 degrees.   Time 4   Period Weeks   Status New   PT LONG TERM GOAL #2   Title 5/5 left LE strength to increase stability for accomplishment of functional tasks.   Time 4   Period Weeks   Status New   PT LONG TERM GOAL #3   Title Perform ADL's with pain not > 3/10.   Time 4   Period Weeks   Status New               Plan - 02/24/15 6712    Clinical Impression Statement Patient did well in therapy today although he reported pain with heel slides. Demonstrates L quad weakness with L SLR and LAQ. Continued to complete SLR with L knee flexion although required mod verbal cueing to straighten L knee with exercise.  Firm end feel noted in L knee extension PROM today in supine with normal L patellar mobilizations and scar mobility. Normal modalities response noted following removal of the modaliites. Experienced 1/10 pain following treatment today.  FOTO initial limitation 68%, current limitation 59%. goal and current  status : CK.   Pt will benefit from skilled therapeutic intervention in order to improve on the following deficits Abnormal gait;Decreased range of motion;Decreased strength;Increased edema;Pain   Rehab Potential Excellent   PT Frequency 3x / week   PT Duration 4 weeks   PT Treatment/Interventions ADLs/Self Care Home  Management;Cryotherapy;Advice worker;Therapeutic activities;Therapeutic exercise;Neuromuscular re-education;Patient/family education;Manual techniques;Passive range of motion;Visual/perceptual remediation/compensation   PT Next Visit Plan Total knee protocol.  Electrical stimulation and vasopneumatic. (October 12th Dr. Alvan Dame)   Consulted and Agree with Plan of Care Patient        Problem List Patient Active Problem List   Diagnosis Date Noted  . Chronic diastolic CHF (congestive heart failure) 01/24/2015  . Atrial fibrillation with RVR 01/23/2015  .  Shortness of breath 01/23/2015  . Physical deconditioning 01/23/2015  . Dementia 01/23/2015  . Essential hypertension 01/23/2015  . Diastolic CHF 96/78/9381  . S/P left TKA 01/16/2015  . Atrial fibrillation, persistent 02/25/2014  . Metabolic syndrome 01/75/1025  . BPH (benign prostatic hyperplasia) 06/16/2013  . Chest pain 03/18/2013  . Acute encephalopathy 12/11/2012  . Hypotension, unspecified 12/11/2012  . Elevated troponin 12/11/2012  . Expected blood loss anemia 12/09/2012  . Obese 12/09/2012  . S/P right TKA 12/08/2012  . Preop cardiovascular exam 11/25/2012  . Essential hypertension, benign   . Other and unspecified hyperlipidemia   . Diaphragmatic hernia without mention of obstruction or gangrene   . Gout, unspecified   . Calculus of kidney   . [place], [place]   . Asymptomatic varicose veins   . [place], PTA 02/24/2015, 10:10 AM  [facility] [address] [place], [place], [address] Phone: [phone]   Fax:  [phone]

## 2015-02-27 ENCOUNTER — Ambulatory Visit: Payer: Medicare Other | Attending: Orthopedic Surgery | Admitting: Physical Therapy

## 2015-02-27 DIAGNOSIS — M25662 Stiffness of left knee, not elsewhere classified: Secondary | ICD-10-CM | POA: Diagnosis not present

## 2015-02-27 DIAGNOSIS — R531 Weakness: Secondary | ICD-10-CM | POA: Diagnosis not present

## 2015-02-27 DIAGNOSIS — M25562 Pain in left knee: Secondary | ICD-10-CM | POA: Insufficient documentation

## 2015-02-27 NOTE — Therapy (Signed)
[facility] [place], [place], [address] Phone: [phone]   Fax:  [phone]  Physical Therapy Treatment  Patient Details  Name: Mark Wilkinson MRN: [id number] Date of Birth: Jun 01, 1927 Referring Provider:  Chipper Herb, MD  Encounter Date: 02/27/2015      PT End of Session - 02/27/15 0820    Visit Number 6   Number of Visits 12   Date for PT Re-Evaluation 03/28/15   PT Start Time 0817   PT Stop Time 0914   PT Time Calculation (min) 57 min   Activity Tolerance Patient tolerated treatment well   Behavior During Therapy [facility] for tasks assessed/performed      Past Medical History  Diagnosis Date  . Essential hypertension, benign   . Other and unspecified hyperlipidemia   . Diaphragmatic hernia without mention of obstruction or gangrene   . Gout, unspecified   . Calculus of kidney   . Asymptomatic varicose veins   . Lumbago   . PONV (postoperative nausea and vomiting)   . Dementia   . GERD (gastroesophageal reflux disease)   . Arthritis     "knees, hips, back" (03/18/2013)  . Chronic lower back pain   . Atrial fibrillation   . Dysrhythmia     a-fib  . History of hiatal hernia     Past Surgical History  Procedure Laterality Date  . Appendectomy    . Cholecystectomy    . Cystoscopy with retrograde pyelogram, ureteroscopy and stent placement Left 08/13/2012    Procedure: [place], URETEROSCOPY AND STENT PLACEMENT;  Surgeon: Alexis Frock, MD;  Location: [facility];  Service: Urology;  Laterality: Left;  . Cataract extraction w/ intraocular lens implant Left 2000's  . Total knee arthroplasty Right 12/08/2012    Procedure: RIGHT TOTAL KNEE ARTHROPLASTY;  Surgeon: [name], MD;  Location: [facility];  Service: Orthopedics;  Laterality: Right;  . Tonsillectomy and adenoidectomy      "no tubes" (03/18/2013)  . Toe amputation Right ?1970's    "shot his toe off in  hunting accident" (03/18/2013)  . Cystoscopy w/ stone manipulation      "twice" (03/18/2013)  . Total knee arthroplasty Left 01/16/2015    Procedure: TOTAL LEFT KNEE ARTHROPLASTY;  Surgeon: Paralee Cancel, MD;  Location: [facility];  Service: Orthopedics;  Laterality: Left;    There were no vitals filed for this visit.  Visit Diagnosis:  Left knee pain  Knee stiffness, left  Weakness      Subjective Assessment - 02/27/15 0820    Subjective My knees are hurting this morning.   Currently in Pain? Yes   Pain Score 5    Pain Location Knee   Pain Orientation Left   Pain Descriptors / Indicators Aching;Sore   Pain Type Surgical pain   Pain Onset More than a month ago            [facility] PT Assessment - 02/27/15 0001    Assessment   Medical Diagnosis Left total knee replacement.   Onset Date/Surgical Date 01/16/15   Next MD Visit 03/08/2015  Dr. Alvan Dame   AROM   Overall AROM  Deficits   AROM Assessment Site Knee   Right/Left Knee Left   PROM   Left Knee Extension -9   Left Knee Flexion 118  125 Passive   Strength   Overall Strength Comments MMT 5/5 ext but functionally weak; HS 4/5  [place] Adult PT Treatment/Exercise - 02/27/15 0001    Knee/Hip Exercises: Aerobic   Nustep L5 x12 min, monitored   Knee/Hip Exercises: Supine   Heel Slides AROM;Strengthening;Left;2 sets;10 reps   Straight Leg Raises Strengthening;Left;2 sets;10 reps   Modalities   Modalities Electrical Stimulation;Vasopneumatic   Electrical Stimulation   Electrical Stimulation Location left knee   Electrical Stimulation Action IFC   Electrical Stimulation Parameters 1-10 Hz   Electrical Stimulation Goals Edema;Pain   Vasopneumatic   Number Minutes Vasopneumatic  15 minutes   Vasopnuematic Location  Knee   Vasopneumatic Temperature  3*   Manual Therapy   Manual Therapy Joint mobilization;Passive ROM   Joint Mobilization Left knee for flex/ext   Passive ROM PROM knee  ext/flexion                  PT Short Term Goals - 02/16/15 8828    PT SHORT TERM GOAL #1   Title Ind with an HEP.   Time 2   Period Weeks   Status Achieved   PT SHORT TERM GOAL #2   Title Full active left knee extension to help normalize gait pattern.   Status On-going           PT Long Term Goals - 02/27/15 0902    PT LONG TERM GOAL #1   Title Active left knee flexion= 120-125 degrees.   Time 4   Period Weeks   Status On-going   PT LONG TERM GOAL #2   Title 5/5 left LE strength to increase stability for accomplishment of functional tasks.  5/5 MMT knee ext but funcionally weaker, HS 4/5   Time 4   Period Weeks   Status On-going   PT LONG TERM GOAL #3   Title Perform ADL's with pain not > 3/10.   Period Weeks   Status On-going               Plan - 02/27/15 1019    Clinical Impression Statement Patient is progressing with extension although slowly. His wife reports she keeps his ankle supported when he is sitting to encourage extension stretch. He still has quad weakness with SLR. He demos intermittent difficulty with sit to stand and would benefit from more functionaly strengthening.    Pt will benefit from skilled therapeutic intervention in order to improve on the following deficits Abnormal gait;Decreased range of motion;Decreased strength;Increased edema;Pain   Rehab Potential Excellent   PT Frequency 3x / week   PT Duration 4 weeks   PT Treatment/Interventions ADLs/Self Care Home Management;Cryotherapy;Advice worker;Therapeutic activities;Therapeutic exercise;Neuromuscular re-education;Patient/family education;Manual techniques;Passive range of motion;Visual/perceptual remediation/compensation   PT Next Visit Plan Work on strengthening (sit to stand, bridging, SAQ, SLR), continue to work on knee extension. MD on 03/08/15.   Consulted and Agree with Plan of Care Patient        Problem List Patient Active Problem List    Diagnosis Date Noted  . Chronic diastolic CHF (congestive heart failure) ([place]) 01/24/2015  . Atrial fibrillation with RVR ([place]) 01/23/2015  . Shortness of breath 01/23/2015  . Physical deconditioning 01/23/2015  . Dementia 01/23/2015  . Essential hypertension 01/23/2015  . Diastolic CHF ([place]) 00/34/9179  . S/P left TKA 01/16/2015  . Atrial fibrillation, persistent ([place]) 02/25/2014  . Metabolic syndrome 15/09/6977  . BPH (benign prostatic hyperplasia) 06/16/2013  . Chest pain 03/18/2013  . Acute encephalopathy 12/11/2012  . Hypotension, unspecified 12/11/2012  . Elevated troponin 12/11/2012  . Expected blood loss anemia 12/09/2012  .  Obese 12/09/2012  . S/P right TKA 12/08/2012  . Preop cardiovascular exam 11/25/2012  . Essential hypertension, benign   . Other and unspecified hyperlipidemia   . Diaphragmatic hernia without mention of obstruction or gangrene   . Gout, unspecified   . Calculus of kidney   . [place], [place]   . Asymptomatic varicose veins   . Vergia Alberts PT  02/27/2015, 10:26 AM  [facility] [address], [place], [address] Phone: [phone]   Fax:  [phone]

## 2015-03-01 ENCOUNTER — Ambulatory Visit: Payer: Medicare Other | Admitting: Physical Therapy

## 2015-03-01 ENCOUNTER — Encounter: Payer: Self-pay | Admitting: Physical Therapy

## 2015-03-01 DIAGNOSIS — M25662 Stiffness of left knee, not elsewhere classified: Secondary | ICD-10-CM | POA: Diagnosis not present

## 2015-03-01 DIAGNOSIS — M25562 Pain in left knee: Secondary | ICD-10-CM | POA: Diagnosis not present

## 2015-03-01 DIAGNOSIS — R531 Weakness: Secondary | ICD-10-CM | POA: Diagnosis not present

## 2015-03-01 NOTE — Therapy (Signed)
[facility] [place], [place], [address] Phone: [phone]   Fax:  [phone]  Physical Therapy Treatment  Patient Details  Name: Mark Wilkinson MRN: [id number] Date of Birth: 01-30-1928 Referring Provider:  Chipper Herb, MD  Encounter Date: 03/01/2015      PT End of Session - 03/01/15 0856    Visit Number 7   Number of Visits 12   Date for PT Re-Evaluation 03/28/15   PT Start Time 0814   PT Stop Time 0911   PT Time Calculation (min) 57 min   Activity Tolerance Patient tolerated treatment well   Behavior During Therapy [facility] for tasks assessed/performed      Past Medical History  Diagnosis Date  . Essential hypertension, benign   . Other and unspecified hyperlipidemia   . Diaphragmatic hernia without mention of obstruction or gangrene   . Gout, unspecified   . Calculus of kidney   . Asymptomatic varicose veins   . Lumbago   . PONV (postoperative nausea and vomiting)   . Dementia   . GERD (gastroesophageal reflux disease)   . Arthritis     "knees, hips, back" (03/18/2013)  . Chronic lower back pain   . Atrial fibrillation ([place])   . Dysrhythmia     a-fib  . History of hiatal hernia     Past Surgical History  Procedure Laterality Date  . Appendectomy    . Cholecystectomy    . Cystoscopy with retrograde pyelogram, ureteroscopy and stent placement Left 08/13/2012    Procedure: [place], URETEROSCOPY AND STENT PLACEMENT;  Surgeon: Alexis Frock, MD;  Location: [facility];  Service: Urology;  Laterality: Left;  . Cataract extraction w/ intraocular lens implant Left 2000's  . Total knee arthroplasty Right 12/08/2012    Procedure: RIGHT TOTAL KNEE ARTHROPLASTY;  Surgeon: [name], MD;  Location: [facility];  Service: Orthopedics;  Laterality: Right;  . Tonsillectomy and adenoidectomy      "no tubes" (03/18/2013)  . Toe amputation Right ?1970's    "shot his toe off in  hunting accident" (03/18/2013)  . Cystoscopy w/ stone manipulation      "twice" (03/18/2013)  . Total knee arthroplasty Left 01/16/2015    Procedure: TOTAL LEFT KNEE ARTHROPLASTY;  Surgeon: Paralee Cancel, MD;  Location: [facility];  Service: Orthopedics;  Laterality: Left;    There were no vitals filed for this visit.  Visit Diagnosis:  Left knee pain  Knee stiffness, left  Weakness      Subjective Assessment - 03/01/15 0819    Subjective patient reported sore and stiff knees   Patient is accompained by: Family member   Limitations Walking   How long can you walk comfortably? 10 minutes.   Patient Stated Goals Get my knee better.   Currently in Pain? Yes   Pain Score 5    Pain Location Knee   Pain Orientation Left   Pain Descriptors / Indicators Aching;Sore   Pain Type Surgical pain   Pain Onset More than a month ago   Pain Frequency Constant   Aggravating Factors  weight bearing or activity on knee   Pain Relieving Factors rest            [facility] PT Assessment - 03/01/15 0001    AROM   Overall AROM  Deficits   AROM Assessment Site Knee   Right/Left Knee Left   Left Knee Extension -13   Left Knee Flexion 118   PROM  Overall PROM  Deficits   PROM Assessment Site Knee   Right/Left Knee Left   Left Knee Extension -8   Left Knee Flexion 125                     [facility] Adult PT Treatment/Exercise - 03/01/15 0001    Knee/Hip Exercises: Aerobic   Nustep L5 x15 min, monitored   Knee/Hip Exercises: Seated   Long Arc Quad Strengthening;Left;3 sets;Weights;10 reps   Long Arc Quad Weight 3 lbs.   Knee/Hip Exercises: Supine   Straight Leg Raise with External Rotation Strengthening;Left;3 sets;10 reps  cues for technique   Electrical Stimulation   Electrical Stimulation Location left knee   Electrical Stimulation Action IFC   Electrical Stimulation Parameters 1-10hz    Electrical Stimulation Goals Edema;Pain   Vasopneumatic   Number Minutes Vasopneumatic  15  minutes   Vasopnuematic Location  Knee   Vasopneumatic Pressure Medium   Manual Therapy   Manual Therapy Passive ROM   Passive ROM PROM knee ext/flexion with low hold end range                  PT Short Term Goals - 02/16/15 0841    PT SHORT TERM GOAL #1   Title Ind with an HEP.   Time 2   Period Weeks   Status Achieved   PT SHORT TERM GOAL #2   Title Full active left knee extension to help normalize gait pattern.   Status On-going           PT Long Term Goals - 02/27/15 0902    PT LONG TERM GOAL #1   Title Active left knee flexion= 120-125 degrees.   Time 4   Period Weeks   Status On-going   PT LONG TERM GOAL #2   Title 5/5 left LE strength to increase stability for accomplishment of functional tasks.  5/5 MMT knee ext but funcionally weaker, HS 4/5   Time 4   Period Weeks   Status On-going   PT LONG TERM GOAL #3   Title Perform ADL's with pain not > 3/10.   Period Weeks   Status On-going               Plan - 03/01/15 0272    Clinical Impression Statement Patient progressing with all activities. Patient continues to improve with left knee flexion and ext today. Patient needs cues for correct technique with supine exercises and is ready to progres with gentle standing exercises CGA/SBA . Goals ongoing due to ROM, strength and pain deficts   Pt will benefit from skilled therapeutic intervention in order to improve on the following deficits Abnormal gait;Decreased range of motion;Decreased strength;Increased edema;Pain   Rehab Potential Excellent   PT Frequency 3x / week   PT Duration 4 weeks   PT Treatment/Interventions ADLs/Self Care Home Management;Cryotherapy;Advice worker;Therapeutic activities;Therapeutic exercise;Neuromuscular re-education;Patient/family education;Manual techniques;Passive range of motion;Visual/perceptual remediation/compensation   PT Next Visit Plan Work on strengthening  gentle standing and sit to  stand, bridging, SAQ, SLR, continue to work on knee extension. MD on 03/08/15.   Consulted and Agree with Plan of Care Patient        Problem List Patient Active Problem List   Diagnosis Date Noted  . Chronic diastolic CHF (congestive heart failure) ([place]) 01/24/2015  . Atrial fibrillation with RVR ([place]) 01/23/2015  . Shortness of breath 01/23/2015  . Physical deconditioning 01/23/2015  . Dementia 01/23/2015  . Essential hypertension 01/23/2015  . Diastolic  CHF ([place]) 01/23/2015  . S/P left TKA 01/16/2015  . Atrial fibrillation, persistent ([place]) 02/25/2014  . Metabolic syndrome 81/85/9093  . BPH (benign prostatic hyperplasia) 06/16/2013  . Chest pain 03/18/2013  . Acute encephalopathy 12/11/2012  . Hypotension, unspecified 12/11/2012  . Elevated troponin 12/11/2012  . Expected blood loss anemia 12/09/2012  . Obese 12/09/2012  . S/P right TKA 12/08/2012  . Preop cardiovascular exam 11/25/2012  . Essential hypertension, benign   . Other and unspecified hyperlipidemia   . Diaphragmatic hernia without mention of obstruction or gangrene   . Gout, unspecified   . Calculus of kidney   . [place], [place]   . Asymptomatic varicose veins   . Lumbago     Dannika Hilgeman P, PTA 03/01/2015, 9:18 AM  [facility] [address] [place], [place], [address] Phone: [phone]   Fax:  [phone]

## 2015-03-03 ENCOUNTER — Encounter: Payer: Self-pay | Admitting: *Deleted

## 2015-03-03 ENCOUNTER — Ambulatory Visit: Payer: Medicare Other | Admitting: *Deleted

## 2015-03-03 VITALS — HR 92

## 2015-03-03 DIAGNOSIS — M25662 Stiffness of left knee, not elsewhere classified: Secondary | ICD-10-CM | POA: Diagnosis not present

## 2015-03-03 DIAGNOSIS — R531 Weakness: Secondary | ICD-10-CM

## 2015-03-03 DIAGNOSIS — M25562 Pain in left knee: Secondary | ICD-10-CM

## 2015-03-03 NOTE — Therapy (Signed)
[facility] [place], [place], [address] Phone: [phone]   Fax:  [phone]  Physical Therapy Treatment  Patient Details  Name: Mark Wilkinson MRN: [id number] Date of Birth: 05-Jan-1928 Referring Provider:  Chipper Herb, MD  Encounter Date: 03/03/2015      PT End of Session - 03/03/15 0824    Visit Number 8   Number of Visits 12   Date for PT Re-Evaluation 03/28/15   PT Start Time 0815   PT Stop Time 0905   PT Time Calculation (min) 50 min      Past Medical History  Diagnosis Date  . Essential hypertension, benign   . Other and unspecified hyperlipidemia   . Diaphragmatic hernia without mention of obstruction or gangrene   . Gout, unspecified   . Calculus of kidney   . Asymptomatic varicose veins   . Lumbago   . PONV (postoperative nausea and vomiting)   . Dementia   . GERD (gastroesophageal reflux disease)   . Arthritis     "knees, hips, back" (03/18/2013)  . Chronic lower back pain   . Atrial fibrillation ([place])   . Dysrhythmia     a-fib  . History of hiatal hernia     Past Surgical History  Procedure Laterality Date  . Appendectomy    . Cholecystectomy    . Cystoscopy with retrograde pyelogram, ureteroscopy and stent placement Left 08/13/2012    Procedure: [place], URETEROSCOPY AND STENT PLACEMENT;  Surgeon: Alexis Frock, MD;  Location: [facility];  Service: Urology;  Laterality: Left;  . Cataract extraction w/ intraocular lens implant Left 2000's  . Total knee arthroplasty Right 12/08/2012    Procedure: RIGHT TOTAL KNEE ARTHROPLASTY;  Surgeon: [name], MD;  Location: [facility];  Service: Orthopedics;  Laterality: Right;  . Tonsillectomy and adenoidectomy      "no tubes" (03/18/2013)  . Toe amputation Right ?1970's    "shot his toe off in hunting accident" (03/18/2013)  . Cystoscopy w/ stone manipulation      "twice" (03/18/2013)  . Total knee  arthroplasty Left 01/16/2015    Procedure: TOTAL LEFT KNEE ARTHROPLASTY;  Surgeon: Paralee Cancel, MD;  Location: [facility];  Service: Orthopedics;  Laterality: Left;    Filed Vitals:   03/03/15 0818  Pulse: 92  SpO2: 94%    Visit Diagnosis:  Left knee pain  Knee stiffness, left  Weakness      Subjective Assessment - 03/03/15 0818    Subjective patient reported sore and stiff knees   Patient is accompained by: Family member   Limitations Walking   How long can you walk comfortably? 10 minutes.   Patient Stated Goals Get my knee better.   Currently in Pain? Yes   Pain Score 5    Pain Location Knee   Pain Orientation Left   Pain Descriptors / Indicators Aching;Sore   Pain Type Surgical pain   Pain Onset More than a month ago   Pain Frequency Constant   Aggravating Factors  WB on LT Knee   Pain Relieving Factors rest                         [facility] Adult PT Treatment/Exercise - 03/03/15 0001    Exercises   Exercises Knee/Hip   Knee/Hip Exercises: Aerobic   Nustep L5 x15 min, monitored for tolerance and ROM progression, seat    Knee/Hip Exercises: Standing   Forward  Step Up Left;1 set;10 reps;Step Height: 4";Limitations  fatigues quickly   Knee/Hip Exercises: Seated   Long Arc Quad Strengthening;Left;3 sets;Weights;10 reps   Long Arc Quad Weight 3 lbs.   Sit to [employer] 2 sets;5 reps;with UE support   Modalities   Modalities Designer, multimedia Location left knee IFC x 15 mins   Electrical Stimulation Goals Edema;Pain   Vasopneumatic   Number Minutes Vasopneumatic  15 minutes   Vasopnuematic Location  Knee   Vasopneumatic Pressure Medium   Vasopneumatic Temperature  34   Manual Therapy   Manual Therapy Passive ROM;Soft tissue mobilization;Myofascial release   Passive ROM PROM knee ext/flexion with low hold end range                  PT Short Term Goals - 02/16/15 0841    PT  SHORT TERM GOAL #1   Title Ind with an HEP.   Time 2   Period Weeks   Status Achieved   PT SHORT TERM GOAL #2   Title Full active left knee extension to help normalize gait pattern.   Status On-going           PT Long Term Goals - 02/27/15 0902    PT LONG TERM GOAL #1   Title Active left knee flexion= 120-125 degrees.   Time 4   Period Weeks   Status On-going   PT LONG TERM GOAL #2   Title 5/5 left LE strength to increase stability for accomplishment of functional tasks.  5/5 MMT knee ext but funcionally weaker, HS 4/5   Time 4   Period Weeks   Status On-going   PT LONG TERM GOAL #3   Title Perform ADL's with pain not > 3/10.   Period Weeks   Status On-going               Plan - 03/03/15 0824    Clinical Impression Statement Pt did great today and was able to meet PROM to 120 degrees and extension to -5 degrees and very close to meeting goals.Marland Kitchen He did ok on strengthening exs ,but he does tire quickly. He goes to [place] next week . Goals are ongoing   Pt will benefit from skilled therapeutic intervention in order to improve on the following deficits Abnormal gait;Decreased range of motion;Decreased strength;Increased edema;Pain   Rehab Potential Excellent   PT Frequency 3x / week   PT Duration 4 weeks   PT Treatment/Interventions ADLs/Self Care Home Management;Cryotherapy;Advice worker;Therapeutic activities;Therapeutic exercise;Neuromuscular re-education;Patient/family education;Manual techniques;Passive range of motion;Visual/perceptual remediation/compensation   PT Next Visit Plan Work on strengthening  gentle standing and sit to stand, bridging, SAQ, SLR, continue to work on knee extension. MD on 03/08/15.   Consulted and Agree with Plan of Care Patient        Problem List Patient Active Problem List   Diagnosis Date Noted  . Chronic diastolic CHF (congestive heart failure) ([place]) 01/24/2015  . Atrial fibrillation with RVR ([place])  01/23/2015  . Shortness of breath 01/23/2015  . Physical deconditioning 01/23/2015  . Dementia 01/23/2015  . Essential hypertension 01/23/2015  . Diastolic CHF ([place]) 82/50/0370  . S/P left TKA 01/16/2015  . Atrial fibrillation, persistent ([place]) 02/25/2014  . Metabolic syndrome 48/88/9169  . BPH (benign prostatic hyperplasia) 06/16/2013  . Chest pain 03/18/2013  . Acute encephalopathy 12/11/2012  . Hypotension, unspecified 12/11/2012  . Elevated troponin 12/11/2012  . Expected blood loss anemia 12/09/2012  . Obese  12/09/2012  . S/P right TKA 12/08/2012  . Preop cardiovascular exam 11/25/2012  . Essential hypertension, benign   . Other and unspecified hyperlipidemia   . Diaphragmatic hernia without mention of obstruction or gangrene   . Gout, unspecified   . Calculus of kidney   . [place], [place]   . Asymptomatic varicose veins   . Lumbago     RAMSEUR,CHRIS, PTA 03/03/2015, 9:55 AM  [facility] [address] [place], [place], [address] Phone: [phone]   Fax:  [phone]

## 2015-03-06 ENCOUNTER — Ambulatory Visit: Payer: Medicare Other | Admitting: Physical Therapy

## 2015-03-06 ENCOUNTER — Encounter: Payer: Self-pay | Admitting: Physical Therapy

## 2015-03-06 DIAGNOSIS — M25562 Pain in left knee: Secondary | ICD-10-CM

## 2015-03-06 DIAGNOSIS — M25662 Stiffness of left knee, not elsewhere classified: Secondary | ICD-10-CM | POA: Diagnosis not present

## 2015-03-06 DIAGNOSIS — R531 Weakness: Secondary | ICD-10-CM | POA: Diagnosis not present

## 2015-03-06 NOTE — Therapy (Signed)
[facility] [place], [place], [address] Phone: [phone]   Fax:  [phone]  Physical Therapy Treatment  Patient Details  Name: TRENNON TORBECK MRN: [id number] Date of Birth: 1927-06-19 Referring Provider:  Chipper Herb, MD  Encounter Date: 03/06/2015      PT End of Session - 03/06/15 0856    Visit Number 9   Number of Visits 12   Date for PT Re-Evaluation 03/28/15   PT Start Time 0814   PT Stop Time 0911   PT Time Calculation (min) 57 min   Equipment Utilized During Treatment Oxygen   Activity Tolerance Patient tolerated treatment well   Behavior During Therapy [facility] for tasks assessed/performed      Past Medical History  Diagnosis Date  . Essential hypertension, benign   . Other and unspecified hyperlipidemia   . Diaphragmatic hernia without mention of obstruction or gangrene   . Gout, unspecified   . Calculus of kidney   . Asymptomatic varicose veins   . Lumbago   . PONV (postoperative nausea and vomiting)   . Dementia   . GERD (gastroesophageal reflux disease)   . Arthritis     "knees, hips, back" (03/18/2013)  . Chronic lower back pain   . Atrial fibrillation ([place])   . Dysrhythmia     a-fib  . History of hiatal hernia     Past Surgical History  Procedure Laterality Date  . Appendectomy    . Cholecystectomy    . Cystoscopy with retrograde pyelogram, ureteroscopy and stent placement Left 08/13/2012    Procedure: [place], URETEROSCOPY AND STENT PLACEMENT;  Surgeon: Alexis Frock, MD;  [facility]: [facility];  Service: Urology;  Laterality: Left;  . Cataract extraction w/ intraocular lens implant Left 2000's  . Total knee arthroplasty Right 12/08/2012    Procedure: RIGHT TOTAL KNEE ARTHROPLASTY;  Surgeon: [name], MD;  [facility]: [facility];  Service: Orthopedics;  Laterality: Right;  . Tonsillectomy and adenoidectomy      "no tubes" (03/18/2013)  . Toe  amputation Right ?1970's    "shot his toe off in hunting accident" (03/18/2013)  . Cystoscopy w/ stone manipulation      "twice" (03/18/2013)  . Total knee arthroplasty Left 01/16/2015    Procedure: TOTAL LEFT KNEE ARTHROPLASTY;  Surgeon: Paralee Cancel, MD;  Location: [facility];  Service: Orthopedics;  Laterality: Left;    There were no vitals filed for this visit.  Visit Diagnosis:  Left knee pain  Knee stiffness, left  Weakness      Subjective Assessment - 03/06/15 0822    Subjective patient reported sore and stiff knee today   Patient is accompained by: Family member   Limitations Walking   How long can you walk comfortably? 10 minutes.   Patient Stated Goals Get my knee better.   Pain Score 5    Pain Location Knee   Pain Orientation Left   Pain Descriptors / Indicators Aching;Sore   Pain Type Surgical pain   Pain Onset More than a month ago   Pain Frequency Constant   Aggravating Factors  weight on left knee   Pain Relieving Factors rest            [facility] PT Assessment - 03/06/15 0001    AROM   Overall AROM  Deficits;Within functional limits for tasks performed   AROM Assessment Site Knee   Right/Left Knee Left   Left Knee Extension -15   Left Knee  Flexion 120   PROM   Overall PROM  Deficits;Within functional limits for tasks performed   PROM Assessment Site Knee   Right/Left Knee Left   Left Knee Extension -10   Left Knee Flexion 125                     [facility] Adult PT Treatment/Exercise - 03/06/15 0001    Knee/Hip Exercises: Aerobic   Nustep L5 x15 min, monitored for tolerance and ROM progression,   Knee/Hip Exercises: Standing   Forward Step Up Left;Hand Hold: 2;Step Height: 4"  x10, x5   Rocker Board --  attempted yet patient unable due to soreness   Knee/Hip Exercises: Seated   Long Arc Quad Strengthening;Left;3 sets;Weights;10 reps   Long Arc Quad Weight 4 lbs.   Knee/Hip Exercises: Supine   Straight Leg Raise with External Rotation  Strengthening;Left;3 sets;10 reps   Electrical Stimulation   Electrical Stimulation Location left knee IFC x 15 mins   Electrical Stimulation Action IFC   Electrical Stimulation Parameters 1-10hz    Electrical Stimulation Goals Edema;Pain   Vasopneumatic   Number Minutes Vasopneumatic  15 minutes   Vasopnuematic Location  Knee   Vasopneumatic Pressure Medium   Manual Therapy   Manual Therapy Passive ROM   Passive ROM PROM knee ext/flexion with low hold end range                  PT Short Term Goals - 03/06/15 0857    PT SHORT TERM GOAL #1   Title Ind with an HEP.   Time 2   Period Weeks   Status Achieved   PT SHORT TERM GOAL #2   Title Full active left knee extension to help normalize gait pattern.   Time 2   Period Weeks   Status On-going  -10 (03-06-15)           PT Long Term Goals - 03/06/15 0857    PT LONG TERM GOAL #1   Title Active left knee flexion= 120-125 degrees.   Time 4   Period Weeks   Status Achieved  120 (03-06-15)   PT LONG TERM GOAL #2   Title 5/5 left LE strength to increase stability for accomplishment of functional tasks.   Time 4   Period Weeks   Status On-going   PT LONG TERM GOAL #3   Title Perform ADL's with pain not > 3/10.   Time 4   Period Weeks   Status On-going               Plan - 03/06/15 7829    Clinical Impression Statement Patient progressing with continued reports of pain and stiffness in left knee. [place] has improved ROM for flexion today and was able to meet LTG # 1 today, other goals ongoing due knee ext ROM deficits, pain and strength limitations. Patient has difficulty with any standing and weight bearing activities and requires SBA/ CGA with ambulation. Patient favors right LE.    Pt will benefit from skilled therapeutic intervention in order to improve on the following deficits Abnormal gait;Decreased range of motion;Decreased strength;Increased edema;Pain   Rehab Potential Excellent   PT Frequency 3x  / week   PT Duration 4 weeks   PT Treatment/Interventions ADLs/Self Care Home Management;Cryotherapy;Advice worker;Therapeutic activities;Therapeutic exercise;Neuromuscular re-education;Patient/family education;Manual techniques;Passive range of motion;Visual/perceptual remediation/compensation   PT Next Visit Plan Work on strengthening  gentle standing and sit to stand, bridging, SAQ, SLR, continue to work on knee extension.  MD Alvan Dame on 03/08/15.   Consulted and Agree with Plan of Care Patient;Family member/caregiver        Problem List Patient Active Problem List   Diagnosis Date Noted  . Chronic diastolic CHF (congestive heart failure) ([place]) 01/24/2015  . Atrial fibrillation with RVR ([place]) 01/23/2015  . Shortness of breath 01/23/2015  . Physical deconditioning 01/23/2015  . Dementia 01/23/2015  . Essential hypertension 01/23/2015  . Diastolic CHF ([place]) 54/00/8676  . S/P left TKA 01/16/2015  . Atrial fibrillation, persistent ([place]) 02/25/2014  . Metabolic syndrome 19/50/9326  . BPH (benign prostatic hyperplasia) 06/16/2013  . Chest pain 03/18/2013  . Acute encephalopathy 12/11/2012  . Hypotension, unspecified 12/11/2012  . Elevated troponin 12/11/2012  . Expected blood loss anemia 12/09/2012  . Obese 12/09/2012  . S/P right TKA 12/08/2012  . Preop cardiovascular exam 11/25/2012  . Essential hypertension, benign   . Other and unspecified hyperlipidemia   . Diaphragmatic hernia without mention of obstruction or gangrene   . Gout, unspecified   . Calculus of kidney   . [place], [place]   . Asymptomatic varicose veins   . Lumbago      Aveah Castell P, PTA 03/06/2015, 9:20 AM  Mali Applegate MPT [facility] [address] [place], [place], [address] Phone: [phone]   Fax:  [phone]

## 2015-03-08 DIAGNOSIS — Z96652 Presence of left artificial knee joint: Secondary | ICD-10-CM | POA: Diagnosis not present

## 2015-03-08 DIAGNOSIS — Z471 Aftercare following joint replacement surgery: Secondary | ICD-10-CM | POA: Diagnosis not present

## 2015-03-09 ENCOUNTER — Encounter: Payer: Self-pay | Admitting: Physical Therapy

## 2015-03-09 ENCOUNTER — Ambulatory Visit: Payer: Medicare Other | Admitting: Physical Therapy

## 2015-03-09 DIAGNOSIS — M25662 Stiffness of left knee, not elsewhere classified: Secondary | ICD-10-CM

## 2015-03-09 DIAGNOSIS — R531 Weakness: Secondary | ICD-10-CM | POA: Diagnosis not present

## 2015-03-09 DIAGNOSIS — M25562 Pain in left knee: Secondary | ICD-10-CM | POA: Diagnosis not present

## 2015-03-09 NOTE — Therapy (Signed)
[facility] [place], [place], [address] Phone: [phone]   Fax:  [phone]  Physical Therapy Treatment  Patient Details  Name: Mark Wilkinson MRN: [id number] Date of Birth: 07-28-1927 Referring Provider:  Chipper Herb, MD  Encounter Date: 03/09/2015      PT End of Session - 03/09/15 0836    Visit Number 10   Number of Visits 12   Date for PT Re-Evaluation 03/28/15   PT Start Time 0816   PT Stop Time 0909   PT Time Calculation (min) 53 min   Equipment Utilized During Treatment Oxygen   Activity Tolerance Patient tolerated treatment well      Past Medical History  Diagnosis Date  . Essential hypertension, benign   . Other and unspecified hyperlipidemia   . Diaphragmatic hernia without mention of obstruction or gangrene   . Gout, unspecified   . Calculus of kidney   . Asymptomatic varicose veins   . Lumbago   . PONV (postoperative nausea and vomiting)   . Dementia   . GERD (gastroesophageal reflux disease)   . Arthritis     "knees, hips, back" (03/18/2013)  . Chronic lower back pain   . Atrial fibrillation ([name])   . Dysrhythmia     a-fib  . History of hiatal hernia     Past Surgical History  Procedure Laterality Date  . Appendectomy    . Cholecystectomy    . Cystoscopy with retrograde pyelogram, ureteroscopy and stent placement Left 08/13/2012    Procedure: [place], URETEROSCOPY AND STENT PLACEMENT;  Surgeon: Alexis Frock, MD;  Location: [facility];  Service: Urology;  Laterality: Left;  . Cataract extraction w/ intraocular lens implant Left 2000's  . Total knee arthroplasty Right 12/08/2012    Procedure: RIGHT TOTAL KNEE ARTHROPLASTY;  Surgeon: [name], MD;  Location: [facility];  Service: Orthopedics;  Laterality: Right;  . Tonsillectomy and adenoidectomy      "no tubes" (03/18/2013)  . Toe amputation Right ?1970's    "shot his toe off in hunting  accident" (03/18/2013)  . Cystoscopy w/ stone manipulation      "twice" (03/18/2013)  . Total knee arthroplasty Left 01/16/2015    Procedure: TOTAL LEFT KNEE ARTHROPLASTY;  Surgeon: Paralee Cancel, MD;  Location: [facility];  Service: Orthopedics;  Laterality: Left;    There were no vitals filed for this visit.  Visit Diagnosis:  Left knee pain  Knee stiffness, left      Subjective Assessment - 03/09/15 0819    Subjective Reports that Dr said to work on extension more but today's could be his final treatment with PT.   Patient is accompained by: Family member   Limitations Walking   How long can you walk comfortably? 10 minutes.   Patient Stated Goals Get my knee better.   Currently in Pain? Yes   Pain Score 6    Pain Location Knee   Pain Orientation Left   Pain Descriptors / Indicators Sore   Pain Type Surgical pain   Pain Onset More than a month ago            [facility] PT Assessment - 03/09/15 0001    Assessment   Medical Diagnosis Left total knee replacement.   Onset Date/Surgical Date 01/16/15   ROM / Strength   AROM / PROM / Strength AROM;Strength   AROM   Overall AROM  Deficits;Within functional limits for tasks performed   AROM Assessment Site  Knee   Right/Left Knee Left   Left Knee Extension -5   Left Knee Flexion 120   Strength   Overall Strength Deficits;Within functional limits for tasks performed   Strength Assessment Site Knee   Right/Left Knee Left   Left Knee Flexion 4+/5   Left Knee Extension 5/5                     [facility] Adult PT Treatment/Exercise - 03/09/15 0001    Knee/Hip Exercises: Aerobic   Nustep L6 x15 min with monitoring                PT Education - 03/09/15 0833    Education provided Yes   Education Details HEP- LAQ, forward steps   Person(s) Educated Patient;Spouse   Methods Explanation;Demonstration;Verbal cues;Handout   Comprehension Verbalized understanding;Returned demonstration;Verbal cues required           PT Short Term Goals - 03/09/15 0900    PT SHORT TERM GOAL #1   Title Ind with an HEP.   Time 2   Period Weeks   Status Achieved   PT SHORT TERM GOAL #2   Title Full active left knee extension to help normalize gait pattern.   Time 2   Period Weeks   Status Not Met  -5 R knee from normal ext (03/09/2015)           PT Long Term Goals - 03/09/15 0900    PT LONG TERM GOAL #1   Title Active left knee flexion= 120-125 degrees.   Time 4   Period Weeks   Status Achieved  120 (03-06-15)   PT LONG TERM GOAL #2   Title 5/5 left LE strength to increase stability for accomplishment of functional tasks.   Time 4   Period Weeks   Status Partially Met  5/5 L knee ext although functionally still weak, 4+/5 L knee flex (03/09/2015)   PT LONG TERM GOAL #3   Title Perform ADL's with pain not > 3/10.   Time 4   Period Weeks   Status Achieved               Plan - 03/09/15 8871    Clinical Impression Statement Patient has progressed slowly in therapy to this point in which he is discharged by MD. Patient has achieved goals regarding L knee flexion, HEP and ADLs. Partially achieved LT goals regarding L knee strength but did achieve L knee extension goal. L knee AROM measured as 5-120 deg. L knee ext measured as 5/5 although patient continues to be functionally weak. L knee flexion MMT 4+/5. Good L patellar mobility and incision mobility noted today. Normal modaliites response noted following removal of the modaliites. Experienced 3/10 L knee pain following today's treatment.   Pt will benefit from skilled therapeutic intervention in order to improve on the following deficits Abnormal gait;Decreased range of motion;Decreased strength;Increased edema;Pain   Rehab Potential Excellent   PT Frequency 3x / week   PT Duration 4 weeks   PT Treatment/Interventions ADLs/Self Care Home Management;Cryotherapy;Advice worker;Therapeutic activities;Therapeutic  exercise;Neuromuscular re-education;Patient/family education;Manual techniques;Passive range of motion;Visual/perceptual remediation/compensation   PT Next Visit Plan Communicate to MPT of need for D/C note.   Consulted and Agree with Plan of Care Patient;Family member/caregiver        Problem List Patient Active Problem List   Diagnosis Date Noted  . Chronic diastolic CHF (congestive heart failure) ([place]) 01/24/2015  . Atrial fibrillation with RVR ([place]) 01/23/2015  .  Shortness of breath 01/23/2015  . Physical deconditioning 01/23/2015  . Dementia 01/23/2015  . Essential hypertension 01/23/2015  . Diastolic CHF ([place]) 20/23/3435  . S/P left TKA 01/16/2015  . Atrial fibrillation, persistent ([place]) 02/25/2014  . Metabolic syndrome 68/61/6837  . BPH (benign prostatic hyperplasia) 06/16/2013  . Chest pain 03/18/2013  . Acute encephalopathy 12/11/2012  . Hypotension, unspecified 12/11/2012  . Elevated troponin 12/11/2012  . Expected blood loss anemia 12/09/2012  . Obese 12/09/2012  . S/P right TKA 12/08/2012  . Preop cardiovascular exam 11/25/2012  . Essential hypertension, benign   . Other and unspecified hyperlipidemia   . Diaphragmatic hernia without mention of obstruction or gangrene   . Gout, unspecified   . Calculus of kidney   . [place], [place]   . Asymptomatic varicose veins   . Lumbago     Ahmed Prima, PTA 03/09/2015 9:16 AM  [facility] [address] [place], [place], [address] Phone: [phone]   Fax:  [phone]

## 2015-03-09 NOTE — Patient Instructions (Signed)
Step-Down / Step-Up    Stand on stair step or __6__ inch stool. Slowly bend left leg, lowering other foot to floor. Return by straightening front leg. Repeat __10__ times per set. Do __2-3__ sets per session. Do __2-3__ sessions per day.  http://orth.exer.us/684   Copyright  VHI. All rights reserved.  Knee Extension (Sitting)    Place ____ pound weight on left ankle and straighten knee fully, lower slowly. Repeat _10___ times per set. Do _3___ sets per session. Do _2-3___ sessions per day.  http://orth.exer.us/732   Copyright  VHI. All rights reserved.

## 2015-03-09 NOTE — Therapy (Signed)
[facility] [place], [place], [address] Phone: [phone]   Fax:  [phone]  Physical Therapy Treatment  Patient Details  Name: Mark Wilkinson MRN: [id number] Date of Birth: 1927/07/15 No Data Recorded  Encounter Date: 03/09/2015      PT End of Session - 03/09/15 0836    Visit Number 10   Number of Visits 12   Date for PT Re-Evaluation 03/28/15   PT Start Time 0816   PT Stop Time 0909   PT Time Calculation (min) 53 min   Equipment Utilized During Treatment Oxygen   Activity Tolerance Patient tolerated treatment well      Past Medical History  Diagnosis Date  . Essential hypertension, benign   . Other and unspecified hyperlipidemia   . Diaphragmatic hernia without mention of obstruction or gangrene   . Gout, unspecified   . Calculus of kidney   . Asymptomatic varicose veins   . Lumbago   . PONV (postoperative nausea and vomiting)   . Dementia   . GERD (gastroesophageal reflux disease)   . Arthritis     "knees, hips, back" (03/18/2013)  . Chronic lower back pain   . Atrial fibrillation ([place])   . Dysrhythmia     a-fib  . History of hiatal hernia     Past Surgical History  Procedure Laterality Date  . Appendectomy    . Cholecystectomy    . Cystoscopy with retrograde pyelogram, ureteroscopy and stent placement Left 08/13/2012    Procedure: [place], URETEROSCOPY AND STENT PLACEMENT;  Surgeon: Alexis Frock, MD;  Location: [facility];  Service: Urology;  Laterality: Left;  . Cataract extraction w/ intraocular lens implant Left 2000's  . Total knee arthroplasty Right 12/08/2012    Procedure: RIGHT TOTAL KNEE ARTHROPLASTY;  Surgeon: [name], MD;  Location: [facility];  Service: Orthopedics;  Laterality: Right;  . Tonsillectomy and adenoidectomy      "no tubes" (03/18/2013)  . Toe amputation Right ?1970's    "shot his toe off in hunting accident" (03/18/2013)  .  Cystoscopy w/ stone manipulation      "twice" (03/18/2013)  . Total knee arthroplasty Left 01/16/2015    Procedure: TOTAL LEFT KNEE ARTHROPLASTY;  Surgeon: Paralee Cancel, MD;  Location: [facility];  Service: Orthopedics;  Laterality: Left;    There were no vitals filed for this visit.  Visit Diagnosis:  Left knee pain  Knee stiffness, left      Subjective Assessment - 03/09/15 0819    Subjective Reports that Dr said to work on extension more but today's could be his final treatment with PT.   Patient is accompained by: Family member   Limitations Walking   How long can you walk comfortably? 10 minutes.   Patient Stated Goals Get my knee better.   Currently in Pain? Yes   Pain Score 6    Pain Location Knee   Pain Orientation Left   Pain Descriptors / Indicators Sore   Pain Type Surgical pain   Pain Onset More than a month ago            [facility] PT Assessment - 03/09/15 0001    Assessment   Medical Diagnosis Left total knee replacement.   Onset Date/Surgical Date 01/16/15   ROM / Strength   AROM / PROM / Strength AROM;Strength   AROM   Overall AROM  Deficits;Within functional limits for tasks performed   AROM Assessment Site Knee   Right/Left  Knee Left   Left Knee Extension -5   Left Knee Flexion 120   Strength   Overall Strength Deficits;Within functional limits for tasks performed   Strength Assessment Site Knee   Right/Left Knee Left   Left Knee Flexion 4+/5   Left Knee Extension 5/5                     [facility] Adult PT Treatment/Exercise - 03-22-2015 0001    Knee/Hip Exercises: Aerobic   Nustep L6 x15 min with monitoring                PT Education - 22-Mar-2015 0833    Education provided Yes   Education Details HEP- LAQ, forward steps   Person(s) Educated Patient;Spouse   Methods Explanation;Demonstration;Verbal cues;Handout   Comprehension Verbalized understanding;Returned demonstration;Verbal cues required          PT Short Term Goals -  22-Mar-2015 0900    PT SHORT TERM GOAL #1   Title Ind with an HEP.   Time 2   Period Weeks   Status Achieved   PT SHORT TERM GOAL #2   Title Full active left knee extension to help normalize gait pattern.   Time 2   Period Weeks   Status Not Met  -5 R knee from normal ext (Mar 22, 2015)           PT Long Term Goals - 03/22/2015 0900    PT LONG TERM GOAL #1   Title Active left knee flexion= 120-125 degrees.   Time 4   Period Weeks   Status Achieved  120 (03-06-15)   PT LONG TERM GOAL #2   Title 5/5 left LE strength to increase stability for accomplishment of functional tasks.   Time 4   Period Weeks   Status Partially Met  5/5 L knee ext although functionally still weak, 4+/5 L knee flex (2015/03/22)   PT LONG TERM GOAL #3   Title Perform ADL's with pain not > 3/10.   Time 4   Period Weeks   Status Achieved               Plan - 2015-03-22 9628    Clinical Impression Statement Patient has progressed slowly in therapy to this point in which he is discharged by MD. Patient has achieved goals regarding L knee flexion, HEP and ADLs. Partially achieved LT goals regarding L knee strength but did achieve L knee extension goal. L knee AROM measured as 5-120 deg. L knee ext measured as 5/5 although patient continues to be functionally weak. L knee flexion MMT 4+/5. Good L patellar mobility and incision mobility noted today. Normal modaliites response noted following removal of the modaliites. Experienced 3/10 L knee pain following today's treatment.   Pt will benefit from skilled therapeutic intervention in order to improve on the following deficits Abnormal gait;Decreased range of motion;Decreased strength;Increased edema;Pain   Rehab Potential Excellent   PT Frequency 3x / week   PT Duration 4 weeks   PT Treatment/Interventions ADLs/Self Care Home Management;Cryotherapy;Advice worker;Therapeutic activities;Therapeutic exercise;Neuromuscular  re-education;Patient/family education;Manual techniques;Passive range of motion;Visual/perceptual remediation/compensation   PT Next Visit Plan Communicate to MPT of need for D/C note.   Consulted and Agree with Plan of Care Patient;Family member/caregiver          G-Codes - Mar 22, 2015 1819    Functional Assessment Tool Used FOTO.   Functional Limitation Mobility: Walking and moving around   Mobility: Walking and Moving Around Current Status [phone]) At  least 40 percent but less than 60 percent impaired, limited or restricted   Mobility: Walking and Moving Around Goal Status [phone]) At least 20 percent but less than 40 percent impaired, limited or restricted   Mobility: Walking and Moving Around Discharge Status [phone]) At least 40 percent but less than 60 percent impaired, limited or restricted      Problem List Patient Active Problem List   Diagnosis Date Noted  . Chronic diastolic CHF (congestive heart failure) ([place]) 01/24/2015  . Atrial fibrillation with RVR ([place]) 01/23/2015  . Shortness of breath 01/23/2015  . Physical deconditioning 01/23/2015  . Dementia 01/23/2015  . Essential hypertension 01/23/2015  . Diastolic CHF ([place]) 17/91/5056  . S/P left TKA 01/16/2015  . Atrial fibrillation, persistent ([place]) 02/25/2014  . Metabolic syndrome 97/94/8016  . BPH (benign prostatic hyperplasia) 06/16/2013  . Chest pain 03/18/2013  . Acute encephalopathy 12/11/2012  . Hypotension, unspecified 12/11/2012  . Elevated troponin 12/11/2012  . Expected blood loss anemia 12/09/2012  . Obese 12/09/2012  . S/P right TKA 12/08/2012  . Preop cardiovascular exam 11/25/2012  . Essential hypertension, benign   . Other and unspecified hyperlipidemia   . Diaphragmatic hernia without mention of obstruction or gangrene   . Gout, unspecified   . Calculus of kidney   . [place], [place]   . Asymptomatic varicose veins   . Lumbago    PHYSICAL THERAPY DISCHARGE SUMMARY  Visits from Start  of Care: 10.  Current functional level related to goals / functional outcomes: Please see above.   Remaining deficits: Patient continues to lack function in left knee.   Education / Equipment: HEP. Plan: Patient agrees to discharge.  Patient goals were partially met. Patient is being discharged due to meeting the stated rehab goals.  ?????      [place], [place] MPT 03/09/2015, 6:20 PM  [facility] [address] [place], [place], [address] Phone: [phone]   Fax:  [phone]  Name: Mark Wilkinson MRN: [id number] Date of Birth: 12-27-1927

## 2015-03-10 ENCOUNTER — Encounter: Payer: 59 | Admitting: Physical Therapy

## 2015-03-13 ENCOUNTER — Other Ambulatory Visit (INDEPENDENT_AMBULATORY_CARE_PROVIDER_SITE_OTHER): Payer: Medicare Other

## 2015-03-13 ENCOUNTER — Other Ambulatory Visit: Payer: Self-pay | Admitting: Family Medicine

## 2015-03-13 ENCOUNTER — Ambulatory Visit: Payer: Medicare Other | Admitting: Family Medicine

## 2015-03-13 DIAGNOSIS — I4891 Unspecified atrial fibrillation: Secondary | ICD-10-CM

## 2015-03-13 DIAGNOSIS — E559 Vitamin D deficiency, unspecified: Secondary | ICD-10-CM | POA: Diagnosis not present

## 2015-03-13 DIAGNOSIS — I10 Essential (primary) hypertension: Secondary | ICD-10-CM | POA: Diagnosis not present

## 2015-03-13 DIAGNOSIS — E785 Hyperlipidemia, unspecified: Secondary | ICD-10-CM

## 2015-03-13 NOTE — Progress Notes (Signed)
Lab only 

## 2015-03-14 ENCOUNTER — Ambulatory Visit: Payer: 59 | Admitting: Cardiology

## 2015-03-14 LAB — NMR, LIPOPROFILE
Cholesterol: 109 mg/dL (ref 100–199)
HDL CHOLESTEROL BY NMR: 39 mg/dL — AB (ref >39)
HDL Particle Number: 27.1 umol/L — ABNORMAL LOW (ref >=30.5)
LDL Particle Number: 444 nmol/L (ref <1000)
LDL Size: 20.7 nm (ref >20.5)
LDL-C: 55 mg/dL (ref 0–99)
LP-IR Score: 35 (ref <=45)
Small LDL Particle Number: 249 nmol/L (ref <=527)
Triglycerides by NMR: 77 mg/dL (ref 0–149)

## 2015-03-14 LAB — BMP8+EGFR
BUN/Creatinine Ratio: 11 (ref 10–22)
BUN: 10 mg/dL (ref 8–27)
CO2: 27 mmol/L (ref 18–29)
Calcium: 8.9 mg/dL (ref 8.6–10.2)
Chloride: 103 mmol/L (ref 97–106)
Creatinine, Ser: 0.88 mg/dL (ref 0.76–1.27)
GFR calc Af Amer: 91 mL/min/1.73
GFR calc non Af Amer: 78 mL/min/1.73
Glucose: 122 mg/dL — ABNORMAL HIGH (ref 65–99)
Potassium: 4.2 mmol/L (ref 3.5–5.2)
Sodium: 145 mmol/L — ABNORMAL HIGH (ref 136–144)

## 2015-03-14 LAB — HEPATIC FUNCTION PANEL
ALT: 7 IU/L (ref 0–44)
AST: 15 IU/L (ref 0–40)
Albumin: 3.2 g/dL — ABNORMAL LOW (ref 3.5–4.7)
Alkaline Phosphatase: 79 IU/L (ref 39–117)
Bilirubin Total: 0.5 mg/dL (ref 0.0–1.2)
Bilirubin, Direct: 0.29 mg/dL (ref 0.00–0.40)
Total Protein: 6.1 g/dL (ref 6.0–8.5)

## 2015-03-14 LAB — CBC WITH DIFFERENTIAL/PLATELET
Basophils Absolute: 0 x10E3/uL (ref 0.0–0.2)
Basos: 0 %
EOS (ABSOLUTE): 0.1 x10E3/uL (ref 0.0–0.4)
Eos: 1 %
Hematocrit: 36.6 % — ABNORMAL LOW (ref 37.5–51.0)
Hemoglobin: 11.6 g/dL — ABNORMAL LOW (ref 12.6–17.7)
Immature Grans (Abs): 0 x10E3/uL (ref 0.0–0.1)
Immature Granulocytes: 0 %
Lymphocytes Absolute: 2.7 x10E3/uL (ref 0.7–3.1)
Lymphs: 28 %
MCH: 28.3 pg (ref 26.6–33.0)
MCHC: 31.7 g/dL (ref 31.5–35.7)
MCV: 89 fL (ref 79–97)
Monocytes Absolute: 0.6 x10E3/uL (ref 0.1–0.9)
Monocytes: 6 %
Neutrophils Absolute: 6.3 x10E3/uL (ref 1.4–7.0)
Neutrophils: 65 %
Platelets: 205 x10E3/uL (ref 150–379)
RBC: 4.1 x10E6/uL — ABNORMAL LOW (ref 4.14–5.80)
RDW: 15.6 % — ABNORMAL HIGH (ref 12.3–15.4)
WBC: 9.7 x10E3/uL (ref 3.4–10.8)

## 2015-03-14 LAB — THYROID PANEL WITH TSH
Free Thyroxine Index: 2 (ref 1.2–4.9)
T3 Uptake Ratio: 33 % (ref 24–39)
T4, Total: 6.2 ug/dL (ref 4.5–12.0)
TSH: 1.84 u[IU]/mL (ref 0.450–4.500)

## 2015-03-14 LAB — VITAMIN D 25 HYDROXY (VIT D DEFICIENCY, FRACTURES): Vit D, 25-Hydroxy: 57.3 ng/mL (ref 30.0–100.0)

## 2015-03-15 ENCOUNTER — Encounter: Payer: Self-pay | Admitting: Cardiology

## 2015-03-15 ENCOUNTER — Ambulatory Visit (INDEPENDENT_AMBULATORY_CARE_PROVIDER_SITE_OTHER): Payer: Medicare Other | Admitting: Cardiology

## 2015-03-15 ENCOUNTER — Ambulatory Visit: Payer: 59 | Admitting: Cardiology

## 2015-03-15 ENCOUNTER — Ambulatory Visit (INDEPENDENT_AMBULATORY_CARE_PROVIDER_SITE_OTHER): Payer: Medicare Other

## 2015-03-15 VITALS — BP 106/74 | HR 101 | Ht 72.0 in | Wt 201.0 lb

## 2015-03-15 DIAGNOSIS — I481 Persistent atrial fibrillation: Secondary | ICD-10-CM | POA: Diagnosis not present

## 2015-03-15 DIAGNOSIS — I4819 Other persistent atrial fibrillation: Secondary | ICD-10-CM

## 2015-03-15 DIAGNOSIS — I4891 Unspecified atrial fibrillation: Secondary | ICD-10-CM

## 2015-03-15 MED ORDER — DILTIAZEM HCL ER COATED BEADS 240 MG PO CP24: 240.0000 mg | ORAL_CAPSULE | Freq: Every day | ORAL | Status: DC

## 2015-03-15 NOTE — Progress Notes (Signed)
Patient ID: Mark Wilkinson, male   DOB: 1928/02/07, 81 y.o.   MRN: [id number]   Patient sent over from Dr [facility] office for 24 hour heart monitor placement. Monitor placed on patient with no problem. Gave patient and wife directions on care and use. Advised to return to office in 24 hours for removal and to call if they have any questions. Patient and wife verbalized understanding

## 2015-03-15 NOTE — Progress Notes (Signed)
Cardiology Office Note   Date:  03/15/2015   ID:  Mark Wilkinson, DOB 01/18/1928, MRN [id number]  PCP:  Redge Gainer, MD  Cardiologist:  Dr.  Percival Spanish  Chief Complaint  Patient presents with  . Atrial Fibrillation      History of Present Illness: Mark Wilkinson is a 81 y.o. male who presents for follow up of a fib. Chads 2 vasc score is 3,  He's been in fibrillation since July and has been readmitted in August with rapid rate. His at some problems with weight loss. Set her recent knee surgery. He slowly recovering from all of this. He was on home O2 but his oxygen levels of greater than 90%. Today he is doing well and denies any symptoms.  The patient denies any new symptoms such as chest discomfort, neck or arm discomfort. There has been no new shortness of breath, PND or orthopnea. There have been no reported palpitations, presyncope or syncope.  Past Medical History  Diagnosis Date  . Essential hypertension, benign   . Other and unspecified hyperlipidemia   . Diaphragmatic hernia without mention of obstruction or gangrene   . Gout, unspecified   . Calculus of kidney   . Asymptomatic varicose veins   . Lumbago   . PONV (postoperative nausea and vomiting)   . Dementia   . GERD (gastroesophageal reflux disease)   . Arthritis     "knees, hips, back" (03/18/2013)  . Chronic lower back pain   . Atrial fibrillation ([place])   . Dysrhythmia     a-fib  . History of hiatal hernia     Past Surgical History  Procedure Laterality Date  . Appendectomy    . Cholecystectomy    . Cystoscopy with retrograde pyelogram, ureteroscopy and stent placement Left 08/13/2012    Procedure: [place], URETEROSCOPY AND STENT PLACEMENT;  Surgeon: Alexis Frock, MD;  Location: [facility];  Service: Urology;  Laterality: Left;  . Cataract extraction w/ intraocular lens implant Left 2000's  . Total knee arthroplasty Right 12/08/2012    Procedure: RIGHT TOTAL  KNEE ARTHROPLASTY;  Surgeon: [name], MD;  Location: [facility];  Service: Orthopedics;  Laterality: Right;  . Tonsillectomy and adenoidectomy      "no tubes" (03/18/2013)  . Toe amputation Right ?1970's    "shot his toe off in hunting accident" (03/18/2013)  . Cystoscopy w/ stone manipulation      "twice" (03/18/2013)  . Total knee arthroplasty Left 01/16/2015    Procedure: TOTAL LEFT KNEE ARTHROPLASTY;  Surgeon: Paralee Cancel, MD;  Location: [facility];  Service: Orthopedics;  Laterality: Left;     Current Outpatient Prescriptions  Medication Sig Dispense Refill  . acetaminophen (TYLENOL) 500 MG tablet Take 1 tablet (500 mg total) by mouth every 6 (six) hours as needed. 30 tablet 0  . allopurinol (ZYLOPRIM) 300 MG tablet TAKE 1 TABLET DAILY 90 tablet 0  . atenolol (TENORMIN) 50 MG tablet Take by mouth. 50 mg AM 25 mg PM    . atorvastatin (LIPITOR) 40 MG tablet TAKE 1 TABLET BY MOUTH EVERY DAY 90 tablet 0  . B Complex-C (SUPER B COMPLEX PO) Take 1 tablet by mouth at bedtime.     . Cholecalciferol (VITAMIN D3) 1000 UNITS CAPS Take 2,000 Units by mouth every morning.     . diltiazem (CARDIZEM CD) 240 MG 24 hr capsule Take 1 capsule (240 mg total) by mouth daily. 30 capsule 0  . docusate sodium (COLACE)  100 MG capsule Take 1 capsule (100 mg total) by mouth 2 (two) times daily. 10 capsule 0  . esomeprazole (NEXIUM) 40 MG capsule TAKE 1 CAPSULE DAILY 90 capsule 1  . ferrous sulfate 325 (65 FE) MG tablet Take 1 tablet (325 mg total) by mouth 3 (three) times daily after meals.  3  . furosemide (LASIX) 20 MG tablet Take 1 tablet (20 mg total) by mouth daily. 90 tablet 3  . HYDROcodone-acetaminophen (NORCO) 7.5-325 MG per tablet Take 1-2 tablets by mouth every 4 (four) hours as needed for moderate pain. 100 tablet 0  . Multiple Vitamin (MULTIVITAMIN WITH MINERALS) TABS Take 1 tablet by mouth every morning.     . niacin (NIASPAN) 500 MG CR tablet TAKE 3 TABLETS BY MOUTH AT BEDTIME (Patient taking  differently: TAKE 2 TABLETS BY MOUTH AT BEDTIME) 270 tablet 1  . Omega-3 Fatty Acids (FISH OIL) 1000 MG CAPS Take 1,000 mg by mouth 2 (two) times daily.     . polyethylene glycol (MIRALAX / GLYCOLAX) packet Take 17 g by mouth 2 (two) times daily. 14 each 0  . Ranibizumab (LUCENTIS) 0.3 MG/0.05ML SOLN Place 1 application into the left eye. Injection q 6-8 weeks    . RAPAFLO 8 MG CAPS capsule TAKE 1 CAPSULE EVERY DAY WITH FOOD 90 capsule 0  . rivaroxaban (XARELTO) 20 MG TABS tablet Take 1 tablet (20 mg total) by mouth daily. 30 tablet    No current facility-administered medications for this visit.    Allergies:   Penicillins    ROS:  Knee pain.  Otherwise as stated in the HPI and negative for all other systems.  Wt Readings from Last 3 Encounters:  03/15/15 201 lb (91.173 kg)  02/13/15 206 lb (93.441 kg)  01/27/15 210 lb 11.2 oz (95.573 kg)     PHYSICAL EXAM: VS:  BP 106/74 mmHg  Pulse 101  Ht 6' (1.829 m)  Wt 201 lb (91.173 kg)  BMI 27.25 kg/m2  SpO2 96% , BMI Body mass index is 27.25 kg/(m^2). GENERAL:  Well appearing for his age 13:  Pupils equal round and reactive, fundi not visualized, oral mucosa unremarkable NECK:  No jugular venous distention, waveform within normal limits, carotid upstroke brisk and symmetric, no bruits, no thyromegaly LYMPHATICS:  No cervical, inguinal adenopathy LUNGS:  Clear to auscultation bilaterally BACK:  No CVA tenderness CHEST:  Unremarkable HEART:  PMI not displaced or sustained,S1 and S2 within normal limits, no S3, no clicks, no rubs, no murmurs, irregular ABD:  Flat, positive bowel sounds normal in frequency in pitch, no bruits, no rebound, no guarding, no midline pulsatile mass, no hepatomegaly, no splenomegaly EXT:  2 plus pulses throughout, no edema, no cyanosis no clubbing SKIN:  No rashes no nodules NEURO:  Cranial nerves II through XII grossly intact, motor grossly intact throughout PSYCH:  Cognitively intact, oriented to person  place and time   EKG:  EKG is NOT ordered today.    Recent Labs: 01/24/2015: B Natriuretic Peptide 538.6* 02/13/2015: Hemoglobin 11.4*; Platelets 259 03/13/2015: ALT 7; BUN 10; Creatinine, Ser 0.88; Potassium 4.2; Sodium 145*; TSH 1.840    Lipid Panel    Component Value Date/Time   CHOL 109 03/13/2015 1528   TRIG 77 03/13/2015 1528   TRIG 67 03/19/2013 0612   HDL 39* 03/13/2015 1528   HDL 48 03/19/2013 0612   CHOLHDL 2.4 03/19/2013 0612   VLDL 13 03/19/2013 0612   LDLCALC 53 02/08/2014 0819   LDLCALC 56 03/19/2013 0612  Other studies Reviewed: Additional studies/ records that were reviewed today include: previous OV notes, hospital notes and echo  Echo: 01/25/15 Study Conclusions  - Left ventricle: The cavity size was normal. Wall thickness was increased in a pattern of mild LVH. Systolic function was normal. The estimated ejection fraction was in the range of 55% to 65%. Wall motion was normal; there were no regional wall motion abnormalities. - Aortic valve: Mildly calcified annulus. - Mitral valve: There was mild regurgitation. - Left atrium: The atrium was mildly dilated   ASSESSMENT AND PLAN:  1. Atrial fib persistent    He will remain on the anticoagulation.  I will check a Holter to judge rate control.  2. Anemia-  I reviewed labs done 2 days ago and is doing well. His CBC is stable and only very slightly low. Thyroid was normal. No change in therapy is indicated.  3. Hypoxia on home oxygen-  He can come off of his oxygen as his saturations are in the 90s. She will check at night off of oxygen to make sure that he is above 92%.  4. Chronic diastolic HF - He seems to be euvolemic. No change in therapy is indicated. His creatinine was stable 2 days ago.   Current medicines are reviewed with the patient today.  The patient Has no concerns regarding medicines.  The following changes have been made:  None Labs/ tests ordered today include:   Holter  Disposition:   FU with me in one year.   Signed, Minus Breeding, MD  03/15/2015 9:48 AM    [facility]

## 2015-03-15 NOTE — Patient Instructions (Signed)
Medication Instructions:  The current medical regimen is effective;  continue present plan and medications.  Testing/Procedures: Your physician has recommended that you wear a holter monitor for 24 hours.  This will be put on at [facility].  Holter monitors are medical devices that record the heart's electrical activity. Doctors most often use these monitors to diagnose arrhythmias. Arrhythmias are problems with the speed or rhythm of the heartbeat. The monitor is a small, portable device. You can wear one while you do your normal daily activities. This is usually used to diagnose what is causing palpitations/syncope (passing out).  Follow-Up: Follow up in 1 year with Dr. Percival Spanish .  You will receive a letter in the mail 2 months before you are due.  Please call us when you receive this letter to schedule your follow up appointment.  Thank you for choosing [facility]!!

## 2015-03-16 ENCOUNTER — Other Ambulatory Visit: Payer: Self-pay | Admitting: *Deleted

## 2015-03-16 DIAGNOSIS — I4891 Unspecified atrial fibrillation: Secondary | ICD-10-CM

## 2015-03-16 DIAGNOSIS — I4819 Other persistent atrial fibrillation: Secondary | ICD-10-CM

## 2015-03-17 ENCOUNTER — Other Ambulatory Visit: Payer: Self-pay | Admitting: Family Medicine

## 2015-03-17 ENCOUNTER — Ambulatory Visit (INDEPENDENT_AMBULATORY_CARE_PROVIDER_SITE_OTHER): Payer: Medicare Other | Admitting: Family Medicine

## 2015-03-17 ENCOUNTER — Encounter: Payer: Self-pay | Admitting: Family Medicine

## 2015-03-17 VITALS — BP 86/65 | HR 70 | Temp 97.4°F | Ht 72.0 in | Wt 201.0 lb

## 2015-03-17 DIAGNOSIS — Z23 Encounter for immunization: Secondary | ICD-10-CM

## 2015-03-17 DIAGNOSIS — I10 Essential (primary) hypertension: Secondary | ICD-10-CM | POA: Diagnosis not present

## 2015-03-17 DIAGNOSIS — I5032 Chronic diastolic (congestive) heart failure: Secondary | ICD-10-CM | POA: Diagnosis not present

## 2015-03-17 DIAGNOSIS — N400 Enlarged prostate without lower urinary tract symptoms: Secondary | ICD-10-CM

## 2015-03-17 DIAGNOSIS — E559 Vitamin D deficiency, unspecified: Secondary | ICD-10-CM

## 2015-03-17 DIAGNOSIS — E785 Hyperlipidemia, unspecified: Secondary | ICD-10-CM

## 2015-03-17 DIAGNOSIS — I4891 Unspecified atrial fibrillation: Secondary | ICD-10-CM | POA: Diagnosis not present

## 2015-03-17 NOTE — Patient Instructions (Addendum)
Medicare Annual Wellness Visit  [place] and the medical providers at [facility] strive to bring you the best medical care.  In doing so we not only want to address your current medical conditions and concerns but also to detect new conditions early and prevent illness, disease and health-related problems.    Medicare offers a yearly Wellness Visit which allows our clinical staff to assess your need for preventative services including immunizations, lifestyle education, counseling to decrease risk of preventable diseases and screening for fall risk and other medical concerns.    This visit is provided free of charge (no copay) for all Medicare recipients. The clinical pharmacists at [facility] have begun to conduct these Wellness Visits which will also include a thorough review of all your medications.    As you primary medical provider recommend that you make an appointment for your Annual Wellness Visit if you have not done so already this year.  You may set up this appointment before you leave today or you may call back ([phone]) and schedule an appointment.  Please make sure when you call that you mention that you are scheduling your Annual Wellness Visit with the clinical pharmacist so that the appointment may be made for the proper length of time.     Continue current medications. Continue good therapeutic lifestyle changes which include good diet and exercise. Fall precautions discussed with patient. If an FOBT was given today- please return it to our front desk. If you are over 15 years old - you may need Prevnar 17 or the adult Pneumonia vaccine.  **Flu shots are available--- please call and schedule a FLU-CLINIC appointment**  After your visit with [place] today you will receive a survey in the mail or online from [employer] regarding your care with [place]. Please take a moment to fill this out. Your feedback is very  important to [place] as you can help [place] better understand your patient needs as well as improve your experience and satisfaction. WE CARE ABOUT YOU!!!   The patient should continue to follow-up with the cardiologist as planned He should continue with any physical therapy that he is doing at home or next-door. He should continue to be careful and not putting himself at risk for falling She should continue to follow-up with his orthopedist Continue with thickened fluids as she is currently doing for her husband. The wife will continue the patient's atenolol as he is currently doing but if he remains weak and tired she will reverse the way he is taking the Tenormin and take 20 5 in the morning and 50 in the evening.

## 2015-03-17 NOTE — Progress Notes (Signed)
Subjective:    Patient ID: Mark Wilkinson, male    DOB: August 24, 1927, 81 y.o.   MRN: [id number]  HPI Pt here for follow up and management of chronic medical problems which includes hyperlipidemia. He is taking medications regularly. The patient is doing better today than when he was last seen in the practice. He recently had his second knee replacement. Following this he became more confused had shortness of breath and had atrial fibrillation requiring another hospitalization. He is recovering well and doing better and much more alert and has seen the cardiologist. He has no specific complaints on the review of systems that was done initially today. He also has had his lab work with done and this will be reviewed with him and his wife during the visit.The CBC has a normal white blood cell count. The hemoglobin is slightly decreased at 11.6 but improved from the past month. The platelet count is adequate. The blood sugar is elevated at 122. The creatinine, the most important kidney function test is within normal limits. The electrolytes including potassium are good except the serum sodium is elevated by 1. and we will continue to monitor this. The liver function tests are within normal limits except one is slightly decreased and this is probably secondary to his recent hospitalization and decreased eating. It is improved from 1 month ago. All cholesterol numbers with advanced lipid testing are good and at goal. The LDL particle number is good at 444. The LDL C is good at 55 and the triglycerides are good at 77. The good cholesterol however remains low as it has been in the past.--- The patient should continue with his atorvastatin and niacin and aggressive therapeutic lifestyle changes as he can tolerate. All thyroid function tests are within normal limits The vitamin D level is good at 57.3 he should continue with current treatment . This information was reviewed with the patient and his wife present. This  patient never complains with anything. Any complaints have to come from his wife. She agreed with his lack of symptoms other than his weakness following the knee replacement and continuing physical therapy which is helping to strengthen him. He denies chest pain or shortness of breath. Denies any problems with his GI tract or trouble passing his water. He has not seen any blood in the stool or had any black tarry bowel movements. He is doing remarkably well considering all he has been through recently.      Patient Active Problem List   Diagnosis Date Noted  . Chronic diastolic CHF (congestive heart failure) ([place]) 01/24/2015  . Atrial fibrillation with RVR ([place]) 01/23/2015  . Shortness of breath 01/23/2015  . Physical deconditioning 01/23/2015  . Dementia 01/23/2015  . Essential hypertension 01/23/2015  . Diastolic CHF ([place]) 70/48/8891  . S/P left TKA 01/16/2015  . Atrial fibrillation, persistent ([place]) 02/25/2014  . Metabolic syndrome 69/45/0388  . BPH (benign prostatic hyperplasia) 06/16/2013  . Chest pain 03/18/2013  . Acute encephalopathy 12/11/2012  . Hypotension, unspecified 12/11/2012  . Elevated troponin 12/11/2012  . Expected blood loss anemia 12/09/2012  . Obese 12/09/2012  . S/P right TKA 12/08/2012  . Preop cardiovascular exam 11/25/2012  . Essential hypertension, benign   . Other and unspecified hyperlipidemia   . Diaphragmatic hernia without mention of obstruction or gangrene   . Gout, unspecified   . Calculus of kidney   . [place], [place]   . Asymptomatic varicose veins   . Lumbago    Outpatient  Encounter Prescriptions as of 03/17/2015  Medication Sig  . acetaminophen (TYLENOL) 500 MG tablet Take 1 tablet (500 mg total) by mouth every 6 (six) hours as needed.  Marland Kitchen allopurinol (ZYLOPRIM) 300 MG tablet TAKE 1 TABLET DAILY  . atenolol (TENORMIN) 50 MG tablet Take by mouth. 50 mg AM 25 mg PM  . atorvastatin (LIPITOR) 40 MG tablet TAKE 1 TABLET BY MOUTH  EVERY DAY  . B Complex-C (SUPER B COMPLEX PO) Take 1 tablet by mouth at bedtime.   . Cholecalciferol (VITAMIN D3) 1000 UNITS CAPS Take 2,000 Units by mouth every morning.   . diltiazem (CARDIZEM CD) 240 MG 24 hr capsule Take 1 capsule (240 mg total) by mouth daily.  Marland Kitchen docusate sodium (COLACE) 100 MG capsule Take 1 capsule (100 mg total) by mouth 2 (two) times daily.  Marland Kitchen esomeprazole (NEXIUM) 40 MG capsule TAKE 1 CAPSULE DAILY  . ferrous sulfate 325 (65 FE) MG tablet Take 1 tablet (325 mg total) by mouth 3 (three) times daily after meals.  . furosemide (LASIX) 20 MG tablet Take 1 tablet (20 mg total) by mouth daily.  . Multiple Vitamin (MULTIVITAMIN WITH MINERALS) TABS Take 1 tablet by mouth every morning.   . niacin (NIASPAN) 500 MG CR tablet TAKE 3 TABLETS BY MOUTH AT BEDTIME (Patient taking differently: TAKE 2 TABLETS BY MOUTH AT BEDTIME)  . Omega-3 Fatty Acids (FISH OIL) 1000 MG CAPS Take 1,000 mg by mouth 2 (two) times daily.   . Ranibizumab (LUCENTIS) 0.3 MG/0.05ML SOLN Place 1 application into the left eye. Injection q 6-8 weeks  . RAPAFLO 8 MG CAPS capsule TAKE 1 CAPSULE EVERY DAY WITH FOOD  . rivaroxaban (XARELTO) 20 MG TABS tablet Take 1 tablet (20 mg total) by mouth daily.  . [DISCONTINUED] HYDROcodone-acetaminophen (NORCO) 7.5-325 MG per tablet Take 1-2 tablets by mouth every 4 (four) hours as needed for moderate pain.  . polyethylene glycol (MIRALAX / GLYCOLAX) packet Take 17 g by mouth 2 (two) times daily. (Patient not taking: Reported on 03/17/2015)   No facility-administered encounter medications on file as of 03/17/2015.      Review of Systems  Constitutional: Negative.   HENT: Negative.   Eyes: Negative.   Respiratory: Negative.   Cardiovascular: Negative.   Gastrointestinal: Negative.   Endocrine: Negative.   Genitourinary: Negative.   Musculoskeletal: Negative.   Skin: Negative.   Allergic/Immunologic: Negative.   Neurological: Negative.   Hematological: Negative.    Psychiatric/Behavioral: Negative.        Objective:   Physical Exam  Constitutional: He is oriented to person, place, and time. He appears well-developed and well-nourished. No distress.  HENT:  Head: Normocephalic and atraumatic.  Right Ear: External ear normal.  Left Ear: External ear normal.  Nose: Nose normal.  Mouth/Throat: Oropharynx is clear and moist. No oropharyngeal exudate.  Eyes: Conjunctivae and EOM are normal. Pupils are equal, round, and reactive to light. Right eye exhibits no discharge. Left eye exhibits no discharge. No scleral icterus.  Neck: Normal range of motion. Neck supple. No tracheal deviation present. No thyromegaly present.  Cardiovascular: Normal rate and normal heart sounds.   No murmur heard. The pedal pulses were diminished bilaterally. The rhythm is irregular irregular at 84/m  Pulmonary/Chest: Effort normal and breath sounds normal. No respiratory distress. He has no wheezes. He has no rales. He exhibits no tenderness.  Clear anteriorly and posteriorly.  Abdominal: Soft. Bowel sounds are normal. He exhibits no mass. There is no tenderness. There is no  rebound and no guarding.  No abdominal tenderness bruits organ enlargement or masses with good inguinal pulses  Musculoskeletal: He exhibits edema. He exhibits no tenderness.  The patient uses a cane for ambulation. He has some residual edema in the left foot. The patient has some residual stiffness and swelling in the left knee which would be expected following the knee replacement surgery he has recently had.  Lymphadenopathy:    He has no cervical adenopathy.  Neurological: He is alert and oriented to person, place, and time. He has normal reflexes. No cranial nerve deficit.  Skin: Skin is warm and dry. No rash noted.  Psychiatric: He has a normal mood and affect. His behavior is normal. Judgment and thought content normal.  He is pleasant and calm as usual and responds appropriately to questions asked  of him.  Nursing note and vitals reviewed.  BP 86/65 mmHg  Pulse 70  Temp(Src) 97.4 F (36.3 C) (Oral)  Ht 6' (1.829 m)  Wt 201 lb (91.173 kg)  BMI 27.25 kg/m2        Assessment & Plan:  1. Atrial fibrillation, unspecified -The patient remains in atrial fibrillation at about 84/m. He will continue to follow-up with cardiology as planned  2. BPH (benign prostatic hyperplasia) -He is wearing a depends as he has trouble getting to the bathroom soon enough and he has no problems with doing this.  3. Essential hypertension, benign -His blood pressure was somewhat low today at 86/65. The wife will continue to monitor this and if the patient remains symptomatic like more fatigue and tiredness and sleeping she may reverse the way that he is taking his atenolol to taking the larger dose in the evening and the smaller dose in the morning.  4. Vitamin D deficiency -The vitamin D level was good and he will continue with current treatment  5. Hyperlipemia -All cholesterol numbers were excellent he will continue with current treatment  6. Atrial fibrillation with RVR ([place]) -The patient now has atrial fibrillation with a rate of about 84/m and no changes will be made and he will continue to follow-up with the cardiologist.  7. Chronic diastolic congestive heart failure ([place]) -There are no signs of any heart failure symptoms today and the lungs were clear anteriorly and posteriorly.  Patient Instructions                       Medicare Annual Wellness Visit  [facility] and the medical providers at [facility] strive to bring you the best medical care.  In doing so we not only want to address your current medical conditions and concerns but also to detect new conditions early and prevent illness, disease and health-related problems.    Medicare offers a yearly Wellness Visit which allows our clinical staff to assess your need for preventative services including  immunizations, lifestyle education, counseling to decrease risk of preventable diseases and screening for fall risk and other medical concerns.    This visit is provided free of charge (no copay) for all Medicare recipients. The clinical pharmacists at [facility] have begun to conduct these Wellness Visits which will also include a thorough review of all your medications.    As you primary medical provider recommend that you make an appointment for your Annual Wellness Visit if you have not done so already this year.  You may set up this appointment before you leave today or you may call back [phone])  and schedule an appointment.  Please make sure when you call that you mention that you are scheduling your Annual Wellness Visit with the clinical pharmacist so that the appointment may be made for the proper length of time.     Continue current medications. Continue good therapeutic lifestyle changes which include good diet and exercise. Fall precautions discussed with patient. If an FOBT was given today- please return it to our front desk. If you are over 39 years old - you may need Prevnar 79 or the adult Pneumonia vaccine.  **Flu shots are available--- please call and schedule a FLU-CLINIC appointment**  After your visit with [place] today you will receive a survey in the mail or online from [employer] regarding your care with [place]. Please take a moment to fill this out. Your feedback is very important to [place] as you can help [place] better understand your patient needs as well as improve your experience and satisfaction. WE CARE ABOUT YOU!!!   The patient should continue to follow-up with the cardiologist as planned He should continue with any physical therapy that he is doing at home or next-door. He should continue to be careful and not putting himself at risk for falling She should continue to follow-up with his orthopedist Continue with thickened fluids as she is currently  doing for her husband. The wife will continue the patient's atenolol as he is currently doing but if he remains weak and tired she will reverse the way he is taking the Tenormin and take 20 5 in the morning and 50 in the evening.   Arrie Senate MD

## 2015-03-23 ENCOUNTER — Other Ambulatory Visit: Payer: Self-pay | Admitting: Cardiology

## 2015-03-28 ENCOUNTER — Other Ambulatory Visit: Payer: Self-pay | Admitting: Family Medicine

## 2015-03-28 DIAGNOSIS — M25562 Pain in left knee: Secondary | ICD-10-CM | POA: Diagnosis not present

## 2015-03-28 DIAGNOSIS — M9904 Segmental and somatic dysfunction of sacral region: Secondary | ICD-10-CM | POA: Diagnosis not present

## 2015-03-28 DIAGNOSIS — M9902 Segmental and somatic dysfunction of thoracic region: Secondary | ICD-10-CM | POA: Diagnosis not present

## 2015-03-28 DIAGNOSIS — M5430 Sciatica, unspecified side: Secondary | ICD-10-CM | POA: Diagnosis not present

## 2015-03-28 DIAGNOSIS — M9903 Segmental and somatic dysfunction of lumbar region: Secondary | ICD-10-CM | POA: Diagnosis not present

## 2015-03-30 DIAGNOSIS — M9904 Segmental and somatic dysfunction of sacral region: Secondary | ICD-10-CM | POA: Diagnosis not present

## 2015-03-30 DIAGNOSIS — M5430 Sciatica, unspecified side: Secondary | ICD-10-CM | POA: Diagnosis not present

## 2015-03-30 DIAGNOSIS — M9903 Segmental and somatic dysfunction of lumbar region: Secondary | ICD-10-CM | POA: Diagnosis not present

## 2015-03-30 DIAGNOSIS — M25562 Pain in left knee: Secondary | ICD-10-CM | POA: Diagnosis not present

## 2015-03-30 DIAGNOSIS — M9902 Segmental and somatic dysfunction of thoracic region: Secondary | ICD-10-CM | POA: Diagnosis not present

## 2015-04-03 DIAGNOSIS — H34812 Central retinal vein occlusion, left eye, with macular edema: Secondary | ICD-10-CM | POA: Diagnosis not present

## 2015-04-04 DIAGNOSIS — M9904 Segmental and somatic dysfunction of sacral region: Secondary | ICD-10-CM | POA: Diagnosis not present

## 2015-04-04 DIAGNOSIS — M9902 Segmental and somatic dysfunction of thoracic region: Secondary | ICD-10-CM | POA: Diagnosis not present

## 2015-04-04 DIAGNOSIS — M25562 Pain in left knee: Secondary | ICD-10-CM | POA: Diagnosis not present

## 2015-04-04 DIAGNOSIS — M9903 Segmental and somatic dysfunction of lumbar region: Secondary | ICD-10-CM | POA: Diagnosis not present

## 2015-04-04 DIAGNOSIS — M5430 Sciatica, unspecified side: Secondary | ICD-10-CM | POA: Diagnosis not present

## 2015-04-06 DIAGNOSIS — M9902 Segmental and somatic dysfunction of thoracic region: Secondary | ICD-10-CM | POA: Diagnosis not present

## 2015-04-06 DIAGNOSIS — M5430 Sciatica, unspecified side: Secondary | ICD-10-CM | POA: Diagnosis not present

## 2015-04-06 DIAGNOSIS — M9903 Segmental and somatic dysfunction of lumbar region: Secondary | ICD-10-CM | POA: Diagnosis not present

## 2015-04-06 DIAGNOSIS — M25562 Pain in left knee: Secondary | ICD-10-CM | POA: Diagnosis not present

## 2015-04-06 DIAGNOSIS — M9904 Segmental and somatic dysfunction of sacral region: Secondary | ICD-10-CM | POA: Diagnosis not present

## 2015-04-07 ENCOUNTER — Other Ambulatory Visit: Payer: Self-pay | Admitting: Family Medicine

## 2015-04-10 DIAGNOSIS — M25562 Pain in left knee: Secondary | ICD-10-CM | POA: Diagnosis not present

## 2015-04-10 DIAGNOSIS — M9904 Segmental and somatic dysfunction of sacral region: Secondary | ICD-10-CM | POA: Diagnosis not present

## 2015-04-10 DIAGNOSIS — M9903 Segmental and somatic dysfunction of lumbar region: Secondary | ICD-10-CM | POA: Diagnosis not present

## 2015-04-10 DIAGNOSIS — M9902 Segmental and somatic dysfunction of thoracic region: Secondary | ICD-10-CM | POA: Diagnosis not present

## 2015-04-10 DIAGNOSIS — M5430 Sciatica, unspecified side: Secondary | ICD-10-CM | POA: Diagnosis not present

## 2015-04-12 ENCOUNTER — Telehealth: Payer: Self-pay | Admitting: Cardiology

## 2015-04-12 ENCOUNTER — Other Ambulatory Visit: Payer: Self-pay | Admitting: Cardiology

## 2015-04-12 DIAGNOSIS — Z471 Aftercare following joint replacement surgery: Secondary | ICD-10-CM | POA: Diagnosis not present

## 2015-04-12 DIAGNOSIS — Z96652 Presence of left artificial knee joint: Secondary | ICD-10-CM | POA: Diagnosis not present

## 2015-04-12 MED ORDER — DILTIAZEM HCL ER COATED BEADS 240 MG PO CP24: 240.0000 mg | ORAL_CAPSULE | Freq: Every day | ORAL | Status: DC

## 2015-04-12 NOTE — Telephone Encounter (Signed)
°*  STAT* If patient is at the pharmacy, call can be transferred to refill team.   1. Which medications need to be refilled? (please list name of each medication and dose if known) Diltiazem 240mg    2. Which pharmacy/location (including street and city if local pharmacy) is medication to be sent to?[facility] [place]   3. Do they need a 30 day or 90 day supply? [place]

## 2015-04-12 NOTE — Telephone Encounter (Signed)
Rx(s) sent to pharmacy electronically.  

## 2015-04-13 DIAGNOSIS — M9903 Segmental and somatic dysfunction of lumbar region: Secondary | ICD-10-CM | POA: Diagnosis not present

## 2015-04-13 DIAGNOSIS — M25562 Pain in left knee: Secondary | ICD-10-CM | POA: Diagnosis not present

## 2015-04-13 DIAGNOSIS — M9902 Segmental and somatic dysfunction of thoracic region: Secondary | ICD-10-CM | POA: Diagnosis not present

## 2015-04-13 DIAGNOSIS — M5430 Sciatica, unspecified side: Secondary | ICD-10-CM | POA: Diagnosis not present

## 2015-04-13 DIAGNOSIS — M9904 Segmental and somatic dysfunction of sacral region: Secondary | ICD-10-CM | POA: Diagnosis not present

## 2015-04-17 DIAGNOSIS — M9904 Segmental and somatic dysfunction of sacral region: Secondary | ICD-10-CM | POA: Diagnosis not present

## 2015-04-17 DIAGNOSIS — M25562 Pain in left knee: Secondary | ICD-10-CM | POA: Diagnosis not present

## 2015-04-17 DIAGNOSIS — M9902 Segmental and somatic dysfunction of thoracic region: Secondary | ICD-10-CM | POA: Diagnosis not present

## 2015-04-17 DIAGNOSIS — M9903 Segmental and somatic dysfunction of lumbar region: Secondary | ICD-10-CM | POA: Diagnosis not present

## 2015-04-17 DIAGNOSIS — M5430 Sciatica, unspecified side: Secondary | ICD-10-CM | POA: Diagnosis not present

## 2015-04-24 DIAGNOSIS — M25562 Pain in left knee: Secondary | ICD-10-CM | POA: Diagnosis not present

## 2015-04-24 DIAGNOSIS — M9903 Segmental and somatic dysfunction of lumbar region: Secondary | ICD-10-CM | POA: Diagnosis not present

## 2015-04-24 DIAGNOSIS — M5430 Sciatica, unspecified side: Secondary | ICD-10-CM | POA: Diagnosis not present

## 2015-04-24 DIAGNOSIS — M9904 Segmental and somatic dysfunction of sacral region: Secondary | ICD-10-CM | POA: Diagnosis not present

## 2015-04-24 DIAGNOSIS — M9902 Segmental and somatic dysfunction of thoracic region: Secondary | ICD-10-CM | POA: Diagnosis not present

## 2015-04-26 ENCOUNTER — Encounter: Payer: Self-pay | Admitting: *Deleted

## 2015-05-01 DIAGNOSIS — M9902 Segmental and somatic dysfunction of thoracic region: Secondary | ICD-10-CM | POA: Diagnosis not present

## 2015-05-01 DIAGNOSIS — M9904 Segmental and somatic dysfunction of sacral region: Secondary | ICD-10-CM | POA: Diagnosis not present

## 2015-05-01 DIAGNOSIS — M25562 Pain in left knee: Secondary | ICD-10-CM | POA: Diagnosis not present

## 2015-05-01 DIAGNOSIS — M9903 Segmental and somatic dysfunction of lumbar region: Secondary | ICD-10-CM | POA: Diagnosis not present

## 2015-05-01 DIAGNOSIS — M5430 Sciatica, unspecified side: Secondary | ICD-10-CM | POA: Diagnosis not present

## 2015-05-08 DIAGNOSIS — M9904 Segmental and somatic dysfunction of sacral region: Secondary | ICD-10-CM | POA: Diagnosis not present

## 2015-05-08 DIAGNOSIS — M25562 Pain in left knee: Secondary | ICD-10-CM | POA: Diagnosis not present

## 2015-05-08 DIAGNOSIS — M9902 Segmental and somatic dysfunction of thoracic region: Secondary | ICD-10-CM | POA: Diagnosis not present

## 2015-05-08 DIAGNOSIS — M9903 Segmental and somatic dysfunction of lumbar region: Secondary | ICD-10-CM | POA: Diagnosis not present

## 2015-05-08 DIAGNOSIS — M5430 Sciatica, unspecified side: Secondary | ICD-10-CM | POA: Diagnosis not present

## 2015-05-15 DIAGNOSIS — M5430 Sciatica, unspecified side: Secondary | ICD-10-CM | POA: Diagnosis not present

## 2015-05-15 DIAGNOSIS — M9903 Segmental and somatic dysfunction of lumbar region: Secondary | ICD-10-CM | POA: Diagnosis not present

## 2015-05-15 DIAGNOSIS — M9902 Segmental and somatic dysfunction of thoracic region: Secondary | ICD-10-CM | POA: Diagnosis not present

## 2015-05-15 DIAGNOSIS — M25562 Pain in left knee: Secondary | ICD-10-CM | POA: Diagnosis not present

## 2015-05-15 DIAGNOSIS — M9904 Segmental and somatic dysfunction of sacral region: Secondary | ICD-10-CM | POA: Diagnosis not present

## 2015-05-22 DIAGNOSIS — M9902 Segmental and somatic dysfunction of thoracic region: Secondary | ICD-10-CM | POA: Diagnosis not present

## 2015-05-22 DIAGNOSIS — M25562 Pain in left knee: Secondary | ICD-10-CM | POA: Diagnosis not present

## 2015-05-22 DIAGNOSIS — M5430 Sciatica, unspecified side: Secondary | ICD-10-CM | POA: Diagnosis not present

## 2015-05-22 DIAGNOSIS — M9904 Segmental and somatic dysfunction of sacral region: Secondary | ICD-10-CM | POA: Diagnosis not present

## 2015-05-22 DIAGNOSIS — M9903 Segmental and somatic dysfunction of lumbar region: Secondary | ICD-10-CM | POA: Diagnosis not present

## 2015-05-29 DIAGNOSIS — M9903 Segmental and somatic dysfunction of lumbar region: Secondary | ICD-10-CM | POA: Diagnosis not present

## 2015-05-29 DIAGNOSIS — M9904 Segmental and somatic dysfunction of sacral region: Secondary | ICD-10-CM | POA: Diagnosis not present

## 2015-05-29 DIAGNOSIS — M25562 Pain in left knee: Secondary | ICD-10-CM | POA: Diagnosis not present

## 2015-05-29 DIAGNOSIS — M5430 Sciatica, unspecified side: Secondary | ICD-10-CM | POA: Diagnosis not present

## 2015-05-29 DIAGNOSIS — M9902 Segmental and somatic dysfunction of thoracic region: Secondary | ICD-10-CM | POA: Diagnosis not present

## 2015-05-31 ENCOUNTER — Ambulatory Visit (INDEPENDENT_AMBULATORY_CARE_PROVIDER_SITE_OTHER): Payer: Medicare Other | Admitting: Family Medicine

## 2015-05-31 ENCOUNTER — Encounter: Payer: Self-pay | Admitting: Family Medicine

## 2015-05-31 ENCOUNTER — Other Ambulatory Visit (INDEPENDENT_AMBULATORY_CARE_PROVIDER_SITE_OTHER): Payer: Medicare Other

## 2015-05-31 VITALS — BP 80/60 | HR 81 | Temp 96.8°F | Ht 72.0 in | Wt 194.0 lb

## 2015-05-31 DIAGNOSIS — D649 Anemia, unspecified: Secondary | ICD-10-CM

## 2015-05-31 DIAGNOSIS — Z96652 Presence of left artificial knee joint: Secondary | ICD-10-CM | POA: Diagnosis not present

## 2015-05-31 DIAGNOSIS — R231 Pallor: Secondary | ICD-10-CM | POA: Diagnosis not present

## 2015-05-31 DIAGNOSIS — R0602 Shortness of breath: Secondary | ICD-10-CM

## 2015-05-31 DIAGNOSIS — H34812 Central retinal vein occlusion, left eye, with macular edema: Secondary | ICD-10-CM | POA: Diagnosis not present

## 2015-05-31 DIAGNOSIS — I4819 Other persistent atrial fibrillation: Secondary | ICD-10-CM

## 2015-05-31 DIAGNOSIS — I10 Essential (primary) hypertension: Secondary | ICD-10-CM

## 2015-05-31 DIAGNOSIS — R7309 Other abnormal glucose: Secondary | ICD-10-CM | POA: Diagnosis not present

## 2015-05-31 DIAGNOSIS — I499 Cardiac arrhythmia, unspecified: Secondary | ICD-10-CM

## 2015-05-31 DIAGNOSIS — I481 Persistent atrial fibrillation: Secondary | ICD-10-CM | POA: Diagnosis not present

## 2015-05-31 DIAGNOSIS — I959 Hypotension, unspecified: Secondary | ICD-10-CM

## 2015-05-31 LAB — POCT HEMOGLOBIN: HEMOGLOBIN: 12.6 g/dL — AB (ref 14.1–18.1)

## 2015-05-31 MED ORDER — DILTIAZEM HCL ER COATED BEADS 120 MG PO CP24: 240.0000 mg | ORAL_CAPSULE | Freq: Every day | ORAL | Status: DC

## 2015-05-31 MED ORDER — ATENOLOL 25 MG PO TABS: ORAL_TABLET | ORAL | Status: DC

## 2015-05-31 NOTE — Progress Notes (Signed)
Subjective:    Patient ID: Mark Wilkinson, male    DOB: 27-May-1928, 82 y.o.   MRN: [id number]  HPI Patient here today for SOB and paleness. He is accompanied today by his wife. The patient saw the ophthalmologist this morning and the ophthalmologist was concerned with his weakness shortness of breath and pallor. He wanted him to have his hemoglobin checked immediately and that is why he is here. The wife is concerned because he is been more short of breath. An EKG was done and he is in atrial fibrillation. This can certainly account for his weakness and shortness of breath today.      Patient Active Problem List   Diagnosis Date Noted  . Chronic diastolic CHF (congestive heart failure) ([place]) 01/24/2015  . Atrial fibrillation with RVR ([place]) 01/23/2015  . Shortness of breath 01/23/2015  . Physical deconditioning 01/23/2015  . Dementia 01/23/2015  . Essential hypertension 01/23/2015  . Diastolic CHF ([place]) [id number]  . S/P left TKA 01/16/2015  . Atrial fibrillation, persistent ([place]) 02/25/2014  . Metabolic syndrome [id number]  . BPH (benign prostatic hyperplasia) 06/16/2013  . Chest pain 03/18/2013  . Acute encephalopathy 12/11/2012  . Hypotension, unspecified 12/11/2012  . Elevated troponin 12/11/2012  . Expected blood loss anemia 12/09/2012  . Obese 12/09/2012  . S/P right TKA 12/08/2012  . Preop cardiovascular exam 11/25/2012  . Essential hypertension, benign   . Other and unspecified hyperlipidemia   . Diaphragmatic hernia without mention of obstruction or gangrene   . Gout, unspecified   . Calculus of kidney   . [place], [place]   . Asymptomatic varicose veins   . Lumbago    Outpatient Encounter Prescriptions as of 05/31/2015  Medication Sig  . acetaminophen (TYLENOL) 500 MG tablet Take 1 tablet (500 mg total) by mouth every 6 (six) hours as needed.  Marland Kitchen allopurinol (ZYLOPRIM) 300 MG tablet TAKE 1 TABLET DAILY  . atenolol (TENORMIN) 50 MG tablet TAKE 1 & 1/2  TABLETS BY MOUTH EVERY DAY.  Marland Kitchen atorvastatin (LIPITOR) 40 MG tablet TAKE 1 TABLET BY MOUTH EVERY DAY  . B Complex-C (SUPER B COMPLEX PO) Take 1 tablet by mouth at bedtime.   . Cholecalciferol (VITAMIN D3) 1000 UNITS CAPS Take 2,000 Units by mouth every morning.   . diltiazem (CARDIZEM CD) 240 MG 24 hr capsule Take 1 capsule (240 mg total) by mouth daily.  Marland Kitchen docusate sodium (COLACE) 100 MG capsule Take 1 capsule (100 mg total) by mouth 2 (two) times daily.  Marland Kitchen esomeprazole (NEXIUM) 40 MG capsule TAKE 1 CAPSULE DAILY  . ferrous sulfate 325 (65 FE) MG tablet Take 1 tablet (325 mg total) by mouth 3 (three) times daily after meals.  . furosemide (LASIX) 20 MG tablet Take 1 tablet (20 mg total) by mouth daily.  . Multiple Vitamin (MULTIVITAMIN WITH MINERALS) TABS Take 1 tablet by mouth every morning.   . niacin (NIASPAN) 500 MG CR tablet TAKE 3 TABLETS BY MOUTH AT BEDTIME (Patient taking differently: TAKE 2 TABLETS BY MOUTH AT BEDTIME)  . Omega-3 Fatty Acids (FISH OIL) 1000 MG CAPS Take 1,000 mg by mouth 2 (two) times daily.   . polyethylene glycol (MIRALAX / GLYCOLAX) packet Take 17 g by mouth 2 (two) times daily.  . Ranibizumab (LUCENTIS) 0.3 MG/0.05ML SOLN Place 1 application into the left eye. Injection q 6-8 weeks  . RAPAFLO 8 MG CAPS capsule TAKE 1 CAPSULE EVERY DAY WITH FOOD  . rivaroxaban (XARELTO) 20 MG TABS tablet Take 1  tablet (20 mg total) by mouth daily.  . [DISCONTINUED] atenolol (TENORMIN) 50 MG tablet Take by mouth. 50 mg AM 25 mg PM   No facility-administered encounter medications on file as of 05/31/2015.      Review of Systems  Constitutional: Negative.        Pale  HENT: Negative.   Eyes: Negative.   Respiratory: Positive for shortness of breath.   Cardiovascular: Negative.   Gastrointestinal: Negative.   Endocrine: Negative.   Genitourinary: Negative.   Musculoskeletal: Negative.   Skin: Negative.   Allergic/Immunologic: Negative.   Neurological: Negative.     Hematological: Negative.   Psychiatric/Behavioral: Negative.        Objective:   Physical Exam  Constitutional: He is oriented to person, place, and time. He appears well-developed and well-nourished. No distress.  HENT:  Head: Normocephalic and atraumatic.  Left Ear: External ear normal.  Eyes: Conjunctivae and EOM are normal. Pupils are equal, round, and reactive to light. Right eye exhibits no discharge. Left eye exhibits no discharge. No scleral icterus.  Neck: Normal range of motion. Neck supple. No thyromegaly present.  Cardiovascular: Normal rate, regular rhythm and normal heart sounds.   No murmur heard. The heart was slightly irregular at 72/m  Pulmonary/Chest: Effort normal and breath sounds normal. No respiratory distress. He has no wheezes. He has no rales.  Clear anteriorly and posteriorly  Abdominal: Soft. Bowel sounds are normal. He exhibits no mass. There is no tenderness. There is no rebound and no guarding.  Musculoskeletal: Normal range of motion. He exhibits tenderness. He exhibits no edema.  His left knee still has warmth but no sign of any infection and this is still part of the healing process. He says this is an ongoing thing that hurts him.  Lymphadenopathy:    He has no cervical adenopathy.  Neurological: He is alert and oriented to person, place, and time.  Skin: Skin is warm and dry. No rash noted.  Psychiatric: He has a normal mood and affect. His behavior is normal. Judgment and thought content normal.  Nursing note and vitals reviewed.  Repeat blood pressure 80/60 right arm sitting       Assessment & Plan:  1. SOB (shortness of breath) -This is probably associated with his atrial fib and low blood pressure today. - EKG 12-Lead  2. Irregular heart beat -Atrial fibrillation - EKG 12-Lead  3. S/P left TKA -The patient still complaining with his left knee but I believe it is more in the healing phase and will take some more time to get  better  4. Hypotension, unspecified -The blood pressure is low today and the patient is taking atenolol 20 5 in the morning 50 in the evening and furosemide 20 once a day and verapamil 240 at bedtime  5. Essential hypertension, benign -The blood pressure is low today.  6. Atrial fibrillation, persistent ([place]) -We will talk with the cardiologist regarding his current medication and most likely reduce the medicine so that his blood pressure does not get too low and that he falls.  Patient Instructions  The patient should continue to have his blood pressures monitored at home As well as his heart rate If he remains weak and shortness of breath continues we may need to decrease his blood pressure medication slightly His hemoglobin was actually improved and we will wait for the CBC to be returned from the outside lab. Were also waiting for a BMP to be returned.   Arrie Senate  MD   

## 2015-05-31 NOTE — Addendum Note (Signed)
Addended by: Zannie Cove on: 05/31/2015 03:41 PM   Modules accepted: Orders

## 2015-05-31 NOTE — Addendum Note (Signed)
Addended by: Earlene Plater on: 05/31/2015 03:15 PM   Modules accepted: Orders

## 2015-05-31 NOTE — Progress Notes (Signed)
Lab only 

## 2015-05-31 NOTE — Patient Instructions (Signed)
The patient should continue to have his blood pressures monitored at home As well as his heart rate If he remains weak and shortness of breath continues we may need to decrease his blood pressure medication slightly His hemoglobin was actually improved and we will wait for the CBC to be returned from the outside lab. Were also waiting for a BMP to be returned.

## 2015-06-01 LAB — CBC WITH DIFFERENTIAL/PLATELET
BASOS: 0 %
Basophils Absolute: 0 10*3/uL (ref 0.0–0.2)
EOS (ABSOLUTE): 0.1 10*3/uL (ref 0.0–0.4)
EOS: 1 %
HEMATOCRIT: 38.6 % (ref 37.5–51.0)
Hemoglobin: 12.3 g/dL — ABNORMAL LOW (ref 12.6–17.7)
Immature Grans (Abs): 0 10*3/uL (ref 0.0–0.1)
Immature Granulocytes: 0 %
LYMPHS ABS: 2.5 10*3/uL (ref 0.7–3.1)
Lymphs: 27 %
MCH: 26.9 pg (ref 26.6–33.0)
MCHC: 31.9 g/dL (ref 31.5–35.7)
MCV: 85 fL (ref 79–97)
MONOS ABS: 0.5 10*3/uL (ref 0.1–0.9)
Monocytes: 5 %
NEUTROS PCT: 67 %
Neutrophils Absolute: 6.1 10*3/uL (ref 1.4–7.0)
Platelets: 226 10*3/uL (ref 150–379)
RBC: 4.57 x10E6/uL (ref 4.14–5.80)
RDW: 15.5 % — AB (ref 12.3–15.4)
WBC: 9.1 10*3/uL (ref 3.4–10.8)

## 2015-06-01 LAB — BMP8+EGFR
BUN / CREAT RATIO: 9 — AB (ref 10–22)
BUN: 13 mg/dL (ref 8–27)
CALCIUM: 9.6 mg/dL (ref 8.6–10.2)
CO2: 23 mmol/L (ref 18–29)
Chloride: 100 mmol/L (ref 96–106)
Creatinine, Ser: 1.47 mg/dL — ABNORMAL HIGH (ref 0.76–1.27)
GFR, EST AFRICAN AMERICAN: 50 mL/min/{1.73_m2} — AB (ref >59)
GFR, EST NON AFRICAN AMERICAN: 43 mL/min/{1.73_m2} — AB (ref >59)
Glucose: 188 mg/dL — ABNORMAL HIGH (ref 65–99)
POTASSIUM: 4.1 mmol/L (ref 3.5–5.2)
SODIUM: 143 mmol/L (ref 134–144)

## 2015-06-01 LAB — IRON: Iron: 38 ug/dL (ref 38–169)

## 2015-06-03 LAB — HGB A1C W/O EAG: Hgb A1c MFr Bld: 7.2 % — ABNORMAL HIGH (ref 4.8–5.6)

## 2015-06-03 LAB — SPECIMEN STATUS REPORT

## 2015-06-04 ENCOUNTER — Other Ambulatory Visit: Payer: Self-pay | Admitting: Family Medicine

## 2015-06-05 ENCOUNTER — Telehealth: Payer: Self-pay

## 2015-06-05 NOTE — Telephone Encounter (Signed)
Spoke with patient's wife and she is unsure of what to give Mark Wilkinson for a cold.  He has nasal congestion, runny nose and sneezing; does not have any fever or cough.  Would like to know what you recommend.

## 2015-06-05 NOTE — Telephone Encounter (Signed)
Use plain Mucinex, maximum strength, blue and white, 1 twice daily with a large glass of water for cough and congestion. Use nasal saline, over-the-counter 1 spray each nostril 3 or 4 times daily for nasal congestion

## 2015-06-06 NOTE — Telephone Encounter (Signed)
Patient's wife informed

## 2015-06-10 ENCOUNTER — Other Ambulatory Visit: Payer: Self-pay | Admitting: Family Medicine

## 2015-06-12 DIAGNOSIS — M5430 Sciatica, unspecified side: Secondary | ICD-10-CM | POA: Diagnosis not present

## 2015-06-12 DIAGNOSIS — M9902 Segmental and somatic dysfunction of thoracic region: Secondary | ICD-10-CM | POA: Diagnosis not present

## 2015-06-12 DIAGNOSIS — M9903 Segmental and somatic dysfunction of lumbar region: Secondary | ICD-10-CM | POA: Diagnosis not present

## 2015-06-12 DIAGNOSIS — M25562 Pain in left knee: Secondary | ICD-10-CM | POA: Diagnosis not present

## 2015-06-12 DIAGNOSIS — M9904 Segmental and somatic dysfunction of sacral region: Secondary | ICD-10-CM | POA: Diagnosis not present

## 2015-06-14 ENCOUNTER — Other Ambulatory Visit: Payer: Medicare Other

## 2015-06-14 ENCOUNTER — Other Ambulatory Visit: Payer: Self-pay | Admitting: Family Medicine

## 2015-06-14 DIAGNOSIS — R799 Abnormal finding of blood chemistry, unspecified: Secondary | ICD-10-CM | POA: Diagnosis not present

## 2015-06-15 ENCOUNTER — Telehealth: Payer: Self-pay | Admitting: *Deleted

## 2015-06-15 LAB — BMP8+EGFR
BUN / CREAT RATIO: 10 (ref 10–22)
BUN: 11 mg/dL (ref 8–27)
CO2: 22 mmol/L (ref 18–29)
CREATININE: 1.1 mg/dL (ref 0.76–1.27)
Calcium: 9.7 mg/dL (ref 8.6–10.2)
Chloride: 101 mmol/L (ref 96–106)
GFR calc Af Amer: 70 mL/min/{1.73_m2} (ref >59)
GFR, EST NON AFRICAN AMERICAN: 61 mL/min/{1.73_m2} (ref >59)
Glucose: 148 mg/dL — ABNORMAL HIGH (ref 65–99)
Potassium: 4.1 mmol/L (ref 3.5–5.2)
SODIUM: 146 mmol/L — AB (ref 134–144)

## 2015-06-15 NOTE — Telephone Encounter (Signed)
-----   Message from Chipper Herb, MD sent at 06/15/2015  7:07 AM EST ----- Please call the patient's wife with these results  The blood sugar is elevated at 148. The creatinine, the most important kidney function test remains within normal limits and was elevated previously. This is good. The electrolytes including potassium are good except the serum sodium is slightly elevated and this is been the case in the past. For now continue current treatment as recommended by the cardiologist.

## 2015-06-21 ENCOUNTER — Encounter: Payer: Self-pay | Admitting: Pharmacist

## 2015-06-21 ENCOUNTER — Ambulatory Visit (INDEPENDENT_AMBULATORY_CARE_PROVIDER_SITE_OTHER): Payer: Medicare Other | Admitting: Pharmacist

## 2015-06-21 VITALS — BP 90/60 | HR 82 | Ht 72.0 in | Wt 193.0 lb

## 2015-06-21 DIAGNOSIS — E119 Type 2 diabetes mellitus without complications: Secondary | ICD-10-CM | POA: Insufficient documentation

## 2015-06-21 MED ORDER — GLUCOSE BLOOD VI STRP: ORAL_STRIP | Status: DC

## 2015-06-21 MED ORDER — FERROUS SULFATE 325 (65 FE) MG PO TABS: 325.0000 mg | ORAL_TABLET | Freq: Every day | ORAL | Status: DC

## 2015-06-21 MED ORDER — ONETOUCH DELICA LANCETS 33G MISC: Status: DC

## 2015-06-21 MED ORDER — DILTIAZEM HCL ER COATED BEADS 120 MG PO CP24: 120.0000 mg | ORAL_CAPSULE | ORAL | Status: DC

## 2015-06-21 NOTE — Patient Instructions (Addendum)
Stop Niacin / niaspan.  Change atenolol to evening.   Diabetes and Standards of Medical Care   Diabetes is complicated. You may find that your diabetes team includes a dietitian, nurse, diabetes educator, eye doctor, and more. To help everyone know what is going on and to help you get the care you deserve, the following schedule of care was developed to help keep you on track. Below are the tests, exams, vaccines, medicines, education, and plans you will need.  Blood Glucose Goals Prior to meals = 80 - 130 Within 2 hours of the start of a meal = less than 180  HbA1c test (goal is less than 7.0% - your last value was 7.2%) This test shows how well you have controlled your glucose over the past 2 to 3 months. It is used to see if your diabetes management plan needs to be adjusted.   It is performed at least 2 times a year if you are meeting treatment goals.  It is performed 4 times a year if therapy has changed or if you are not meeting treatment goals.  Blood pressure test  This test is performed at every routine medical visit. The goal is less than 140/90 mmHg for most people, but 130/80 mmHg in some cases. Ask your health care provider about your goal.  Dental exam  Follow up with the dentist regularly.  Eye exam  If you are diagnosed with type 1 diabetes as a child, get an exam upon reaching the age of 86 years or older and have had diabetes for 3 to 5 years. Yearly eye exams are recommended after that initial eye exam.  If you are diagnosed with type 1 diabetes as an adult, get an exam within 5 years of diagnosis and then yearly.  If you are diagnosed with type 2 diabetes, get an exam as soon as possible after the diagnosis and then yearly.  Foot care exam  Visual foot exams are performed at every routine medical visit. The exams check for cuts, injuries, or other problems with the feet.  A comprehensive foot exam should be done yearly. This includes visual inspection as  well as assessing foot pulses and testing for loss of sensation.  Check your feet nightly for cuts, injuries, or other problems with your feet. Tell your health care provider if anything is not healing.  Kidney function test (urine microalbumin)  This test is performed once a year.  Type 1 diabetes: The first test is performed 5 years after diagnosis.  Type 2 diabetes: The first test is performed at the time of diagnosis.  A serum creatinine and estimated glomerular filtration rate (eGFR) test is done once a year to assess the level of chronic kidney disease (CKD), if present.  Lipid profile (cholesterol, HDL, LDL, triglycerides)  Performed every 5 years for most people.  The goal for LDL is less than 100 mg/dL. If you are at high risk, the goal is less than 70 mg/dL.  The goal for HDL is 40 mg/dL to 50 mg/dL for men and 50 mg/dL to 60 mg/dL for women. An HDL cholesterol of 60 mg/dL or higher gives some protection against heart disease.  The goal for triglycerides is less than 150 mg/dL.  Influenza vaccine, pneumococcal vaccine, and hepatitis B vaccine  The influenza vaccine is recommended yearly.  The pneumococcal vaccine is generally given once in a lifetime. However, there are some instances when another vaccination is recommended. Check with your health care provider.  The  hepatitis B vaccine is also recommended for adults with diabetes.  Diabetes self-management education  Education is recommended at diagnosis and ongoing as needed.  Treatment plan  Your treatment plan is reviewed at every medical visit.  Document Released: 03/10/2009 Document Revised: 01/13/2013 Document Reviewed: 10/13/2012 [facility] Patient Information 2014 [place].

## 2015-06-21 NOTE — Progress Notes (Signed)
Subjective:    KHADIM HAYEN is a 82 y.o. male who presents for an initial evaluation of Type 2 diabetes mellitus.  Patient was just recently diagnosed with type 2 DM.  I have seen him several years ago to discuss elevated BG and lipids.   Current symptoms/problems include hyperglycemia and have been worsening. Symptoms have been present for 1 month.  A1 was 7.2% (05/31/2015)  Known diabetic complications: none Cardiovascular risk factors: advanced age (older than 60 for men, 45 for women), diabetes mellitus, dyslipidemia, hypertension, male gender and sedentary lifestyle Current diabetic medications include none.   Eye exam current (within one year): no Weight trend: stable Prior visit with dietician: no Current diet: patient and wife report that he does not eat much but he does like sweets and because he doesnot eat much other foods he may overindulge in sweets.  He also admits to eating banana sandwiches regularly.  Current exercise: none  Current monitoring regimen: none Home blood sugar records: none Any episodes of hypoglycemia? n/a  Is He on ACE inhibitor or angiotensin II receptor blocker?  No       The following portions of the patient's history were reviewed and updated as appropriate: allergies, current medications, past family history, past medical history, past social history, past surgical history and problem list.    Objective:    BP 90/60 mmHg  Pulse 82  Ht 6' (1.829 m)  Wt 193 lb (87.544 kg)  BMI 26.17 kg/m2  Lab Review GLUCOSE (mg/dL)  Date Value  06/14/2015 148*  05/31/2015 188*  03/13/2015 122*   GLUCOSE, BLD (mg/dL)  Date Value  02/13/2015 152*  01/27/2015 138*  01/25/2015 135*   CO2 (mmol/L)  Date Value  06/14/2015 22  05/31/2015 23  03/13/2015 27   BUN (mg/dL)  Date Value  06/14/2015 11  05/31/2015 13  03/13/2015 10  02/13/2015 11  01/27/2015 19  01/25/2015 18   CREAT (mg/dL)  Date Value  02/13/2015 0.81  08/12/2012 0.97    CREATININE, SER (mg/dL)  Date Value  06/14/2015 1.10  05/31/2015 1.47*  03/13/2015 0.88    Assessment:    Diabetes Mellitus type II, under inadequate control.    Plan:    1.  Rx changes: recommend discontinue Niaspan incase it might be contributing to increased BG. 2.  Education: Reviewed 'ABCs' of diabetes management (respective goals in parentheses):  A1C (<7), blood pressure (<130/80), and cholesterol (LDL <100). 3.  Compliance at present is estimated to be fair. Efforts to improve compliance (if necessary) will be directed at dietary modifications: educated about CHO counting diet.  and regular blood sugar monitoring: patient is given glucometer and taught to use. He is advised to check BG QOD to QD.  rx for test strips and supplies sent to patient's pharmacy. 4. Follow up: 1 month    Cherre Robins, PharmD, CPP, CDE

## 2015-06-26 DIAGNOSIS — M5430 Sciatica, unspecified side: Secondary | ICD-10-CM | POA: Diagnosis not present

## 2015-06-26 DIAGNOSIS — M25562 Pain in left knee: Secondary | ICD-10-CM | POA: Diagnosis not present

## 2015-06-26 DIAGNOSIS — M9903 Segmental and somatic dysfunction of lumbar region: Secondary | ICD-10-CM | POA: Diagnosis not present

## 2015-06-26 DIAGNOSIS — M9902 Segmental and somatic dysfunction of thoracic region: Secondary | ICD-10-CM | POA: Diagnosis not present

## 2015-06-26 DIAGNOSIS — M9904 Segmental and somatic dysfunction of sacral region: Secondary | ICD-10-CM | POA: Diagnosis not present

## 2015-07-12 DIAGNOSIS — M9903 Segmental and somatic dysfunction of lumbar region: Secondary | ICD-10-CM | POA: Diagnosis not present

## 2015-07-12 DIAGNOSIS — M9902 Segmental and somatic dysfunction of thoracic region: Secondary | ICD-10-CM | POA: Diagnosis not present

## 2015-07-12 DIAGNOSIS — M9904 Segmental and somatic dysfunction of sacral region: Secondary | ICD-10-CM | POA: Diagnosis not present

## 2015-07-12 DIAGNOSIS — M5430 Sciatica, unspecified side: Secondary | ICD-10-CM | POA: Diagnosis not present

## 2015-07-12 DIAGNOSIS — M25562 Pain in left knee: Secondary | ICD-10-CM | POA: Diagnosis not present

## 2015-07-19 DIAGNOSIS — H353131 Nonexudative age-related macular degeneration, bilateral, early dry stage: Secondary | ICD-10-CM | POA: Diagnosis not present

## 2015-07-19 DIAGNOSIS — H35352 Cystoid macular degeneration, left eye: Secondary | ICD-10-CM | POA: Diagnosis not present

## 2015-07-19 DIAGNOSIS — H34812 Central retinal vein occlusion, left eye, with macular edema: Secondary | ICD-10-CM | POA: Diagnosis not present

## 2015-07-25 DIAGNOSIS — M25562 Pain in left knee: Secondary | ICD-10-CM | POA: Diagnosis not present

## 2015-07-25 DIAGNOSIS — M5430 Sciatica, unspecified side: Secondary | ICD-10-CM | POA: Diagnosis not present

## 2015-07-25 DIAGNOSIS — M9903 Segmental and somatic dysfunction of lumbar region: Secondary | ICD-10-CM | POA: Diagnosis not present

## 2015-07-25 DIAGNOSIS — M9904 Segmental and somatic dysfunction of sacral region: Secondary | ICD-10-CM | POA: Diagnosis not present

## 2015-07-25 DIAGNOSIS — M9902 Segmental and somatic dysfunction of thoracic region: Secondary | ICD-10-CM | POA: Diagnosis not present

## 2015-07-26 ENCOUNTER — Encounter: Payer: Self-pay | Admitting: Gastroenterology

## 2015-07-27 ENCOUNTER — Other Ambulatory Visit (INDEPENDENT_AMBULATORY_CARE_PROVIDER_SITE_OTHER): Payer: Medicare Other

## 2015-07-27 DIAGNOSIS — E559 Vitamin D deficiency, unspecified: Secondary | ICD-10-CM

## 2015-07-27 DIAGNOSIS — I10 Essential (primary) hypertension: Secondary | ICD-10-CM | POA: Diagnosis not present

## 2015-07-27 DIAGNOSIS — E119 Type 2 diabetes mellitus without complications: Secondary | ICD-10-CM | POA: Diagnosis not present

## 2015-07-27 DIAGNOSIS — I481 Persistent atrial fibrillation: Secondary | ICD-10-CM | POA: Diagnosis not present

## 2015-07-27 DIAGNOSIS — I4819 Other persistent atrial fibrillation: Secondary | ICD-10-CM

## 2015-07-27 DIAGNOSIS — E785 Hyperlipidemia, unspecified: Secondary | ICD-10-CM

## 2015-07-27 DIAGNOSIS — N400 Enlarged prostate without lower urinary tract symptoms: Secondary | ICD-10-CM

## 2015-07-27 LAB — POCT GLYCOSYLATED HEMOGLOBIN (HGB A1C): HEMOGLOBIN A1C: 6

## 2015-07-28 ENCOUNTER — Telehealth: Payer: Self-pay | Admitting: *Deleted

## 2015-07-28 LAB — CBC WITH DIFFERENTIAL/PLATELET
BASOS: 1 %
Basophils Absolute: 0.1 10*3/uL (ref 0.0–0.2)
EOS (ABSOLUTE): 0.2 10*3/uL (ref 0.0–0.4)
EOS: 2 %
HEMATOCRIT: 38.5 % (ref 37.5–51.0)
HEMOGLOBIN: 12.2 g/dL — AB (ref 12.6–17.7)
IMMATURE GRANS (ABS): 0 10*3/uL (ref 0.0–0.1)
IMMATURE GRANULOCYTES: 0 %
LYMPHS: 29 %
Lymphocytes Absolute: 2.6 10*3/uL (ref 0.7–3.1)
MCH: 27.5 pg (ref 26.6–33.0)
MCHC: 31.7 g/dL (ref 31.5–35.7)
MCV: 87 fL (ref 79–97)
MONOCYTES: 7 %
Monocytes Absolute: 0.6 10*3/uL (ref 0.1–0.9)
NEUTROS ABS: 5.5 10*3/uL (ref 1.4–7.0)
NEUTROS PCT: 61 %
PLATELETS: 206 10*3/uL (ref 150–379)
RBC: 4.44 x10E6/uL (ref 4.14–5.80)
RDW: 16.8 % — ABNORMAL HIGH (ref 12.3–15.4)
WBC: 8.8 10*3/uL (ref 3.4–10.8)

## 2015-07-28 LAB — NMR, LIPOPROFILE
Cholesterol: 100 mg/dL (ref 100–199)
HDL Cholesterol by NMR: 32 mg/dL — ABNORMAL LOW (ref >39)
HDL Particle Number: 20.9 umol/L — ABNORMAL LOW (ref >=30.5)
LDL Particle Number: 573 nmol/L (ref <1000)
LDL SIZE: 20.9 nm (ref >20.5)
LDL-C: 54 mg/dL (ref 0–99)
LP-IR SCORE: 36 (ref <=45)
SMALL LDL PARTICLE NUMBER: 291 nmol/L (ref <=527)
TRIGLYCERIDES BY NMR: 68 mg/dL (ref 0–149)

## 2015-07-28 LAB — BMP8+EGFR
BUN/Creatinine Ratio: 9 — ABNORMAL LOW (ref 10–22)
BUN: 8 mg/dL (ref 8–27)
CO2: 27 mmol/L (ref 18–29)
CREATININE: 0.87 mg/dL (ref 0.76–1.27)
Calcium: 9.3 mg/dL (ref 8.6–10.2)
Chloride: 99 mmol/L (ref 96–106)
GFR, EST AFRICAN AMERICAN: 90 mL/min/{1.73_m2} (ref >59)
GFR, EST NON AFRICAN AMERICAN: 78 mL/min/{1.73_m2} (ref >59)
Glucose: 120 mg/dL — ABNORMAL HIGH (ref 65–99)
Potassium: 4.7 mmol/L (ref 3.5–5.2)
Sodium: 143 mmol/L (ref 134–144)

## 2015-07-28 LAB — HEPATIC FUNCTION PANEL
ALK PHOS: 107 IU/L (ref 39–117)
ALT: 12 IU/L (ref 0–44)
AST: 20 IU/L (ref 0–40)
Albumin: 3.5 g/dL (ref 3.5–4.7)
BILIRUBIN TOTAL: 0.4 mg/dL (ref 0.0–1.2)
BILIRUBIN, DIRECT: 0.14 mg/dL (ref 0.00–0.40)
Total Protein: 6.7 g/dL (ref 6.0–8.5)

## 2015-07-28 LAB — VITAMIN D 25 HYDROXY (VIT D DEFICIENCY, FRACTURES): Vit D, 25-Hydroxy: 64.1 ng/mL (ref 30.0–100.0)

## 2015-07-28 NOTE — Telephone Encounter (Signed)
-----   Message from Chipper Herb, MD sent at 07/28/2015 12:51 PM EST ----- Please call the patient's wife with this report++++++ The blood sugar is elevated at 120 and this is lower than it has been in the recent past. The creatinine, the most important kidney function test is within normal limits. Electrolytes including potassium are good. The CBC has a normal white blood cell count. The hemoglobin is slightly decreased but stable and consistent with past readings at 12.2. The platelet count is adequate. All liver function tests are within normal limits Cholesterol numbers with advanced lipid testing have a total LDL particle number that is good at 573. The LDL C is good at 54. The triglycerides are good at 68. The patient should continue with his current treatment regimen and with as aggressive therapeutic lifestyle changes as possible. The good cholesterol remains low as it has been in the past and the only way to improve this is too watch his diet and work on his weight. The vitamin D level is good at 64.1 and the patient should continue with current treatment

## 2015-07-28 NOTE — Telephone Encounter (Signed)
Pt's wife notified of results Verbalizes understanding 

## 2015-08-04 ENCOUNTER — Ambulatory Visit (INDEPENDENT_AMBULATORY_CARE_PROVIDER_SITE_OTHER): Payer: Medicare Other | Admitting: Family Medicine

## 2015-08-04 ENCOUNTER — Encounter: Payer: Self-pay | Admitting: Family Medicine

## 2015-08-04 ENCOUNTER — Other Ambulatory Visit: Payer: Self-pay | Admitting: *Deleted

## 2015-08-04 VITALS — BP 87/60 | HR 80 | Temp 97.2°F | Ht 72.0 in | Wt 195.0 lb

## 2015-08-04 DIAGNOSIS — R413 Other amnesia: Secondary | ICD-10-CM | POA: Diagnosis not present

## 2015-08-04 DIAGNOSIS — E559 Vitamin D deficiency, unspecified: Secondary | ICD-10-CM

## 2015-08-04 DIAGNOSIS — I959 Hypotension, unspecified: Secondary | ICD-10-CM | POA: Diagnosis not present

## 2015-08-04 DIAGNOSIS — E119 Type 2 diabetes mellitus without complications: Secondary | ICD-10-CM

## 2015-08-04 DIAGNOSIS — N400 Enlarged prostate without lower urinary tract symptoms: Secondary | ICD-10-CM

## 2015-08-04 DIAGNOSIS — R319 Hematuria, unspecified: Secondary | ICD-10-CM

## 2015-08-04 DIAGNOSIS — I10 Essential (primary) hypertension: Secondary | ICD-10-CM

## 2015-08-04 DIAGNOSIS — I481 Persistent atrial fibrillation: Secondary | ICD-10-CM

## 2015-08-04 DIAGNOSIS — I4819 Other persistent atrial fibrillation: Secondary | ICD-10-CM

## 2015-08-04 DIAGNOSIS — E785 Hyperlipidemia, unspecified: Secondary | ICD-10-CM

## 2015-08-04 LAB — URINALYSIS, COMPLETE
BILIRUBIN UA: NEGATIVE
Glucose, UA: NEGATIVE
LEUKOCYTES UA: NEGATIVE
Nitrite, UA: NEGATIVE
PH UA: 7 (ref 5.0–7.5)
SPEC GRAV UA: 1.02 (ref 1.005–1.030)
Urobilinogen, Ur: 0.2 mg/dL (ref 0.2–1.0)

## 2015-08-04 LAB — MICROSCOPIC EXAMINATION
BACTERIA UA: NONE SEEN
RENAL EPITHEL UA: NONE SEEN /HPF

## 2015-08-04 NOTE — Patient Instructions (Addendum)
Medicare Annual Wellness Visit  [facility] and the medical providers at [facility] strive to bring you the best medical care.  In doing so we not only want to address your current medical conditions and concerns but also to detect new conditions early and prevent illness, disease and health-related problems.    Medicare offers a yearly Wellness Visit which allows our clinical staff to assess your need for preventative services including immunizations, lifestyle education, counseling to decrease risk of preventable diseases and screening for fall risk and other medical concerns.    This visit is provided free of charge (no copay) for all Medicare recipients. The clinical pharmacists at [facility] have begun to conduct these Wellness Visits which will also include a thorough review of all your medications.    As you primary medical provider recommend that you make an appointment for your Annual Wellness Visit if you have not done so already this year.  You may set up this appointment before you leave today or you may call back [phone]) and schedule an appointment.  Please make sure when you call that you mention that you are scheduling your Annual Wellness Visit with the clinical pharmacist so that the appointment may be made for the proper length of time.     Continue current medications. Continue good therapeutic lifestyle changes which include good diet and exercise. Fall precautions discussed with patient. If an FOBT was given today- please return it to our front desk. If you are over 49 years old - you may need Prevnar 28 or the adult Pneumonia vaccine.  **Flu shots are available--- please call and schedule a FLU-CLINIC appointment**  After your visit with [place] today you will receive a survey in the mail or online from [employer] regarding your care with [place]. Please take a moment to fill this out. Your feedback is very  important to [place] as you can help [place] better understand your patient needs as well as improve your experience and satisfaction. WE CARE ABOUT YOU!!!   The patient should continue with his current medicines His wife should check his blood pressure regularly and if it runs consistently in the [id number] systolic she should give me a call and I will talk with the cardiologist about this. In the meantime we will continue with his current treatment regimen He should continue to be careful and did not put yourself at risk for falling He can

## 2015-08-04 NOTE — Progress Notes (Signed)
Subjective:    Patient ID: Mark Wilkinson, male    DOB: [birth year], 82 y.o.   MRN: [id number]  HPI Pt here for follow up and management of chronic medical problems which includes a fib, hypertension, lipids and diabetes. He is taking medications regularly.  The patient comes to the visit today with his wife and he has no specific complaints. He usually has no complaints. She also brings in blood pressure readings from home and they run anywhere from the upper 90s 2 over the 60s to the low 80s. Most of the readings seem to be between 100 and 109. These will be scanned into the record. The patient's recent lab work will be reviewed with him and his wife today. He also is in need of getting a urinalysis. He will be given an FOBT to return. The lab work reviewed is as follows ; The blood sugar is elevated at 120 and this is lower than it has been in the recent past. The creatinine, the most important kidney function test is within normal limits. Electrolytes including potassium are good. The CBC has a normal white blood cell count. The hemoglobin is slightly decreased but stable and consistent with past readings at 12.2. The platelet count is adequate. All liver function tests are within normal limits Cholesterol numbers with advanced lipid testing have a total LDL particle number that is good at 573. The LDL C is good at 54. The triglycerides are good at 68. The patient should continue with his current treatment regimen and with as aggressive therapeutic lifestyle changes as possible. The good cholesterol remains low as it has been in the past and the only way to improve this is too watch his diet and work on his weight. The vitamin D level is good at 64.1 and the patient should continue with current treatment The hemoglobin A1c is 6.0%. This means that the average blood sugar for the past 3 months has been under good control even though it is in the prediabetic range. He should watch his diet closely and we  will continue to monitor the average blood sugar at each visit  it is important to note that the patient did have a diabetic eye exam in January of this year by Dr. Zadie Rhine. He denies any chest pain or shortness of breath. He is not having any problems with his GI tract. He does have a history of a large prostate gland and sometimes cannot hold his urine. When they go out he usually wears a depends. He has to get up a couple times at nighttime. He otherwise during the day controls his urine fairly well. He is not seeing any blood in the stool or had any abdominal pain. She has been helping him watch his diet more closely.    Patient Active Problem List   Diagnosis Date Noted  . Type 2 diabetes mellitus ([place]) 06/21/2015  . Chronic diastolic CHF (congestive heart failure) ([place]) 01/24/2015  . Atrial fibrillation with RVR ([place]) 01/23/2015  . Shortness of breath 01/23/2015  . Physical deconditioning 01/23/2015  . Dementia 01/23/2015  . Essential hypertension 01/23/2015  . Diastolic CHF ([place]) [id number]  . S/P left TKA 01/16/2015  . Atrial fibrillation, persistent ([place]) 02/25/2014  . Metabolic syndrome [id number]  . BPH (benign prostatic hyperplasia) 06/16/2013  . Chest pain 03/18/2013  . Acute encephalopathy 12/11/2012  . Hypotension, unspecified 12/11/2012  . Elevated troponin 12/11/2012  . Expected blood loss anemia 12/09/2012  . Obese 12/09/2012  .  S/P right TKA 12/08/2012  . Preop cardiovascular exam 11/25/2012  . Essential hypertension, benign   . Other and unspecified hyperlipidemia   . Diaphragmatic hernia without mention of obstruction or gangrene   . Gout, unspecified   . Calculus of kidney   . [place], [place]   . Asymptomatic varicose veins   . Lumbago    Outpatient Encounter Prescriptions as of 08/04/2015  Medication Sig  . acetaminophen (TYLENOL) 500 MG tablet Take 1 tablet (500 mg total) by mouth every 6 (six) hours as needed.  Marland Kitchen allopurinol (ZYLOPRIM) 300 MG  tablet TAKE 1 TABLET DAILY  . atenolol (TENORMIN) 25 MG tablet Take 1 tablet (25 mg total) by mouth every evening.  Marland Kitchen atorvastatin (LIPITOR) 40 MG tablet TAKE 1 TABLET BY MOUTH EVERY DAY  . B Complex-C (SUPER B COMPLEX PO) Take 1 tablet by mouth at bedtime.   . Cholecalciferol (VITAMIN D3) 1000 UNITS CAPS Take 2,000 Units by mouth every morning.   . diltiazem (CARDIZEM CD) 120 MG 24 hr capsule Take 1 capsule (120 mg total) by mouth every morning.  . docusate sodium (COLACE) 100 MG capsule Take 1 capsule (100 mg total) by mouth 2 (two) times daily.  Marland Kitchen esomeprazole (NEXIUM) 40 MG capsule TAKE 1 CAPSULE DAILY  . ferrous sulfate 325 (65 FE) MG tablet Take 1 tablet (325 mg total) by mouth daily with breakfast.  . furosemide (LASIX) 20 MG tablet Take 1 tablet (20 mg total) by mouth daily.  Marland Kitchen glucose blood test strip Use to check BG once daily.  Please dispsense One Touch Verio Strips.  Dx:  Debbora Dus.9  . Multiple Vitamin (MULTIVITAMIN WITH MINERALS) TABS Take 1 tablet by mouth every morning.   . Omega-3 Fatty Acids (FISH OIL) 1000 MG CAPS Take 1,000 mg by mouth 2 (two) times daily.   [name] DELICA LANCETS 99991111 MISC Use to check BG once daily. Dx:  E11.9.  Marland Kitchen polyethylene glycol (MIRALAX / GLYCOLAX) packet Take 17 g by mouth 2 (two) times daily.  . Ranibizumab (LUCENTIS) 0.3 MG/0.05ML SOLN Place 1 application into the left eye. Injection q 6-8 weeks  . RAPAFLO 8 MG CAPS capsule TAKE 1 CAPSULE EVERY DAY WITH FOOD  . rivaroxaban (XARELTO) 20 MG TABS tablet Take 1 tablet (20 mg total) by mouth daily.   No facility-administered encounter medications on file as of 08/04/2015.     Review of Systems  Constitutional: Negative.   HENT: Negative.   Eyes: Negative.   Respiratory: Negative.   Cardiovascular: Negative.   Gastrointestinal: Negative.   Endocrine: Negative.   Genitourinary: Negative.   Musculoskeletal: Negative.   Skin: Negative.   Allergic/Immunologic: Negative.   Neurological: Negative.     Hematological: Negative.   Psychiatric/Behavioral: Negative.        Objective:   Physical Exam  Constitutional: He is oriented to person, place, and time. He appears well-developed and well-nourished.  Pleasant and 9 complaining as usual. He seems to be in better spirits and his wife says that he is definitely feeling better since his blood pressures not being so low. She checks his blood pressure regularly at home.  HENT:  Head: Normocephalic and atraumatic.  Right Ear: External ear normal.  Left Ear: External ear normal.  Nose: Nose normal.  Mouth/Throat: Oropharynx is clear and moist. No oropharyngeal exudate.  Eyes: Conjunctivae and EOM are normal. Pupils are equal, round, and reactive to light. Right eye exhibits no discharge. Left eye exhibits no discharge. No scleral icterus.  Neck: Normal range of motion. Neck supple. No thyromegaly present.  Cardiovascular: Normal rate and intact distal pulses.   No murmur heard. The heart is irregular at about 84/m but he is taking his Xarelto.  Pulmonary/Chest: Effort normal and breath sounds normal. No respiratory distress. He has no wheezes. He has no rales. He exhibits no tenderness.  Abdominal: Soft. Bowel sounds are normal. He exhibits no mass. There is no tenderness. There is no rebound and no guarding.  Genitourinary: Rectum normal.  A rectal exam today revealed no rectal masses. The prostate is very enlarged.  Musculoskeletal: Normal range of motion. He exhibits no edema or tenderness.  The patient has good mobility and no problem walking.  Lymphadenopathy:    He has no cervical adenopathy.  Neurological: He is alert and oriented to person, place, and time. He has normal reflexes. No cranial nerve deficit.  Skin: Skin is warm and dry. No rash noted.  Psychiatric: He has a normal mood and affect. His behavior is normal. Judgment and thought content normal.  He has a positive affect and is obviously feeling better with the higher  blood pressure readings even though they still are on the lower end of the range.  Nursing note and vitals reviewed.  BP 87/60 mmHg  Pulse 80  Temp(Src) 97.2 F (36.2 C) (Oral)  Ht 6' (1.829 m)  Wt 195 lb (88.451 kg)  BMI 26.44 kg/m2        Assessment & Plan:  1. Type 2 diabetes mellitus without complication, without long-term current use of insulin (HCC) -The blood sugars are running lower. The patient will continue with watching his diet more closely and his wife monitoring his blood sugars at home. - Microalbumin / creatinine urine ratio  2. Essential hypertension, benign -He is currently taking Cardia enzymes CD 120 and we will not change this because it is most likely helping his rhythm control. His blood pressure is at the low end of the range. We will continue to monitor this as well as the readings from home and if he feels bad or runs consistently low readings and we will talk to the cardiologist again about his medication.  3. Atrial fibrillation, persistent ([place]) -He remains in atrial fibrillation at about 84/m but no edema and seems to be tolerating this well  4. BPH (benign prostatic hyperplasia) -He does have a large prostate and there are no rectal masses - Urinalysis, Complete  5. Vitamin D deficiency -Continue vitamin D replacement  6. Hyperlipemia -Continue with cholesterol treatment and diet  7. Hypotension, unspecified -Monitor blood pressures closely at home and get back in touch with [place] if they consistently run in the [id number] systolic  8. Memory impairment -Keep him active and busy  No orders of the defined types were placed in this encounter.   Patient Instructions                       Medicare Annual Wellness Visit  [facility] and the medical providers at [facility] strive to bring you the best medical care.  In doing so we not only want to address your current medical conditions and concerns but also to detect new  conditions early and prevent illness, disease and health-related problems.    Medicare offers a yearly Wellness Visit which allows our clinical staff to assess your need for preventative services including immunizations, lifestyle education, counseling to decrease risk of preventable diseases and screening for fall risk  and other medical concerns.    This visit is provided free of charge (no copay) for all Medicare recipients. The clinical pharmacists at [facility] have begun to conduct these Wellness Visits which will also include a thorough review of all your medications.    As you primary medical provider recommend that you make an appointment for your Annual Wellness Visit if you have not done so already this year.  You may set up this appointment before you leave today or you may call back [phone]) and schedule an appointment.  Please make sure when you call that you mention that you are scheduling your Annual Wellness Visit with the clinical pharmacist so that the appointment may be made for the proper length of time.     Continue current medications. Continue good therapeutic lifestyle changes which include good diet and exercise. Fall precautions discussed with patient. If an FOBT was given today- please return it to our front desk. If you are over 20 years old - you may need Prevnar 43 or the adult Pneumonia vaccine.  **Flu shots are available--- please call and schedule a FLU-CLINIC appointment**  After your visit with [place] today you will receive a survey in the mail or online from [employer] regarding your care with [place]. Please take a moment to fill this out. Your feedback is very important to [place] as you can help [place] better understand your patient needs as well as improve your experience and satisfaction. WE CARE ABOUT YOU!!!   The patient should continue with his current medicines His wife should check his blood pressure regularly and if it runs consistently in  the 123XX123 systolic she should give me a call and I will talk with the cardiologist about this. In the meantime we will continue with his current treatment regimen He should continue to be careful and did not put yourself at risk for falling He can   Arrie Senate MD

## 2015-08-05 LAB — MICROALBUMIN / CREATININE URINE RATIO
CREATININE, UR: 162.7 mg/dL
MICROALB/CREAT RATIO: 19.6 mg/g creat (ref 0.0–30.0)
Microalbumin, Urine: 31.9 ug/mL

## 2015-08-08 DIAGNOSIS — M9903 Segmental and somatic dysfunction of lumbar region: Secondary | ICD-10-CM | POA: Diagnosis not present

## 2015-08-08 DIAGNOSIS — M25562 Pain in left knee: Secondary | ICD-10-CM | POA: Diagnosis not present

## 2015-08-08 DIAGNOSIS — M9902 Segmental and somatic dysfunction of thoracic region: Secondary | ICD-10-CM | POA: Diagnosis not present

## 2015-08-08 DIAGNOSIS — M9904 Segmental and somatic dysfunction of sacral region: Secondary | ICD-10-CM | POA: Diagnosis not present

## 2015-08-08 DIAGNOSIS — M5430 Sciatica, unspecified side: Secondary | ICD-10-CM | POA: Diagnosis not present

## 2015-08-16 ENCOUNTER — Other Ambulatory Visit: Payer: Medicare Other

## 2015-08-16 DIAGNOSIS — Z1211 Encounter for screening for malignant neoplasm of colon: Secondary | ICD-10-CM | POA: Diagnosis not present

## 2015-08-19 LAB — FECAL OCCULT BLOOD, IMMUNOCHEMICAL: Fecal Occult Bld: NEGATIVE

## 2015-08-21 ENCOUNTER — Telehealth: Payer: Self-pay | Admitting: Family Medicine

## 2015-08-21 NOTE — Telephone Encounter (Signed)
Patient aware of results.

## 2015-08-23 ENCOUNTER — Other Ambulatory Visit: Payer: Medicare Other

## 2015-08-23 DIAGNOSIS — R319 Hematuria, unspecified: Secondary | ICD-10-CM | POA: Diagnosis not present

## 2015-08-23 LAB — URINALYSIS, ROUTINE W REFLEX MICROSCOPIC
BILIRUBIN UA: NEGATIVE
GLUCOSE, UA: NEGATIVE
KETONES UA: NEGATIVE
LEUKOCYTES UA: NEGATIVE
PH UA: 6 (ref 5.0–7.5)
SPEC GRAV UA: 1.02 (ref 1.005–1.030)
Urobilinogen, Ur: 0.2 mg/dL (ref 0.2–1.0)

## 2015-08-29 DIAGNOSIS — M25562 Pain in left knee: Secondary | ICD-10-CM | POA: Diagnosis not present

## 2015-08-29 DIAGNOSIS — M9903 Segmental and somatic dysfunction of lumbar region: Secondary | ICD-10-CM | POA: Diagnosis not present

## 2015-08-29 DIAGNOSIS — M9901 Segmental and somatic dysfunction of cervical region: Secondary | ICD-10-CM | POA: Diagnosis not present

## 2015-08-29 DIAGNOSIS — M9904 Segmental and somatic dysfunction of sacral region: Secondary | ICD-10-CM | POA: Diagnosis not present

## 2015-08-29 DIAGNOSIS — M531 Cervicobrachial syndrome: Secondary | ICD-10-CM | POA: Diagnosis not present

## 2015-08-29 DIAGNOSIS — M9902 Segmental and somatic dysfunction of thoracic region: Secondary | ICD-10-CM | POA: Diagnosis not present

## 2015-08-29 DIAGNOSIS — M9905 Segmental and somatic dysfunction of pelvic region: Secondary | ICD-10-CM | POA: Diagnosis not present

## 2015-08-30 DIAGNOSIS — H34812 Central retinal vein occlusion, left eye, with macular edema: Secondary | ICD-10-CM | POA: Diagnosis not present

## 2015-08-31 ENCOUNTER — Other Ambulatory Visit: Payer: Self-pay | Admitting: Family Medicine

## 2015-09-12 DIAGNOSIS — M9903 Segmental and somatic dysfunction of lumbar region: Secondary | ICD-10-CM | POA: Diagnosis not present

## 2015-09-12 DIAGNOSIS — M9905 Segmental and somatic dysfunction of pelvic region: Secondary | ICD-10-CM | POA: Diagnosis not present

## 2015-09-12 DIAGNOSIS — M531 Cervicobrachial syndrome: Secondary | ICD-10-CM | POA: Diagnosis not present

## 2015-09-12 DIAGNOSIS — M9904 Segmental and somatic dysfunction of sacral region: Secondary | ICD-10-CM | POA: Diagnosis not present

## 2015-09-12 DIAGNOSIS — M9902 Segmental and somatic dysfunction of thoracic region: Secondary | ICD-10-CM | POA: Diagnosis not present

## 2015-09-12 DIAGNOSIS — M9901 Segmental and somatic dysfunction of cervical region: Secondary | ICD-10-CM | POA: Diagnosis not present

## 2015-09-12 DIAGNOSIS — M25562 Pain in left knee: Secondary | ICD-10-CM | POA: Diagnosis not present

## 2015-09-13 ENCOUNTER — Other Ambulatory Visit: Payer: Medicare Other

## 2015-09-13 DIAGNOSIS — R300 Dysuria: Secondary | ICD-10-CM

## 2015-09-13 LAB — URINALYSIS, COMPLETE
BILIRUBIN UA: NEGATIVE
GLUCOSE, UA: NEGATIVE
Leukocytes, UA: NEGATIVE
NITRITE UA: NEGATIVE
SPEC GRAV UA: 1.015 (ref 1.005–1.030)
UUROB: 2 mg/dL — AB (ref 0.2–1.0)
pH, UA: 7 (ref 5.0–7.5)

## 2015-09-13 LAB — MICROSCOPIC EXAMINATION
Bacteria, UA: NONE SEEN
EPITHELIAL CELLS (NON RENAL): NONE SEEN /HPF (ref 0–10)

## 2015-09-14 LAB — URINE CULTURE: Organism ID, Bacteria: NO GROWTH

## 2015-09-16 ENCOUNTER — Other Ambulatory Visit: Payer: Self-pay | Admitting: Family Medicine

## 2015-09-20 ENCOUNTER — Ambulatory Visit (INDEPENDENT_AMBULATORY_CARE_PROVIDER_SITE_OTHER): Payer: Medicare Other | Admitting: Pharmacist

## 2015-09-20 ENCOUNTER — Encounter: Payer: Self-pay | Admitting: Pharmacist

## 2015-09-20 ENCOUNTER — Ambulatory Visit: Payer: Medicare Other

## 2015-09-20 VITALS — BP 120/80 | HR 65 | Ht 71.0 in | Wt 194.0 lb

## 2015-09-20 DIAGNOSIS — Z0000 Encounter for general adult medical examination without abnormal findings: Secondary | ICD-10-CM

## 2015-09-20 DIAGNOSIS — M8580 Other specified disorders of bone density and structure, unspecified site: Secondary | ICD-10-CM

## 2015-09-20 DIAGNOSIS — M4000 Postural kyphosis, site unspecified: Secondary | ICD-10-CM

## 2015-09-20 DIAGNOSIS — R29890 Loss of height: Secondary | ICD-10-CM

## 2015-09-20 NOTE — Patient Instructions (Addendum)
Mark Wilkinson , Thank you for taking time to come for your Medicare Wellness Visit. I appreciate your ongoing commitment to your health goals. Please review the following plan we discussed and let me know if I can assist you in the future.   These are the goals we discussed: Continue to follow low carbohydrate diet  - limit sugar, potato, bread, pasta and rice intake / servings.  You A1c has improved and looks great! - it was 6.0% and our goal is for it to be less than 7% Continue to stay active - try to either walk or do chair exercises from hand out I gave you every day.   This is a list of the screening recommended for you and due dates:  Health Maintenance  Topic Date Due  . Eye exam for diabetics  07/25/2009 - mention to Dr Zadie Rhine at next visit about new diabetes diagnosis.    . Flu Shot  12/26/2015  . Hemoglobin A1C  01/27/2016  . Complete foot exam   08/03/2016  . Urine Protein Check  08/03/2016  . Tetanus Vaccine  02/24/2021  . Shingles Vaccine  Completed  . Pneumonia vaccines  Completed    Health Maintenance, Male A healthy lifestyle and preventative care can promote health and wellness.  Maintain regular health, dental, and eye exams.  Eat a healthy diet. Foods like vegetables, fruits, whole grains, low-fat dairy products, and lean protein foods contain the nutrients you need and are low in calories. Decrease your intake of foods high in solid fats, added sugars, and salt. Get information about a proper diet from your health care provider, if necessary.  Regular physical exercise is one of the most important things you can do for your health. Most adults should get at least 150 minutes of moderate-intensity exercise (any activity that increases your heart rate and causes you to sweat) each week. In addition, most adults need muscle-strengthening exercises on 2 or more days a week.   Maintain a healthy weight. The body mass index (BMI) is a screening tool to identify possible  weight problems. It provides an estimate of body fat based on height and weight. Your health care provider can find your BMI and can help you achieve or maintain a healthy weight. For males 20 years and older:  A BMI below 18.5 is considered underweight.  A BMI of 18.5 to 24.9 is normal.  A BMI of 25 to 29.9 is considered overweight.  A BMI of 30 and above is considered obese.  Maintain normal blood lipids and cholesterol by exercising and minimizing your intake of saturated fat. Eat a balanced diet with plenty of fruits and vegetables. Blood tests for lipids and cholesterol should begin at age 78 and be repeated every 5 years. If your lipid or cholesterol levels are high, you are over age 55, or you are at high risk for heart disease, you may need your cholesterol levels checked more frequently.Ongoing high lipid and cholesterol levels should be treated with medicines if diet and exercise are not working.  If you smoke, find out from your health care provider how to quit. If you do not use tobacco, do not start.  Lung cancer screening is recommended for adults aged 86-80 years who are at high risk for developing lung cancer because of a history of smoking. A yearly low-dose CT scan of the lungs is recommended for people who have at least a 30-pack-year history of smoking and are current smokers or have quit within  the past 15 years. A pack year of smoking is smoking an average of 1 pack of cigarettes a day for 1 year (for example, a 30-pack-year history of smoking could mean smoking 1 pack a day for 30 years or 2 packs a day for 15 years). Yearly screening should continue until the smoker has stopped smoking for at least 15 years. Yearly screening should be stopped for people who develop a health problem that would prevent them from having lung cancer treatment.  If you choose to drink alcohol, do not have more than 2 drinks per day. One drink is considered to be 12 oz (360 mL) of beer, 5 oz (150  mL) of wine, or 1.5 oz (45 mL) of liquor.  Avoid the use of street drugs. Do not share needles with anyone. Ask for help if you need support or instructions about stopping the use of drugs.  High blood pressure causes heart disease and increases the risk of stroke. High blood pressure is more likely to develop in:  People who have blood pressure in the end of the normal range (100-139/85-89 mm Hg).  People who are overweight or obese.  People who are African American.  If you are 5-79 years of age, have your blood pressure checked every 3-5 years. If you are 91 years of age or older, have your blood pressure checked every year. You should have your blood pressure measured twice--once when you are at a hospital or clinic, and once when you are not at a hospital or clinic. Record the average of the two measurements. To check your blood pressure when you are not at a hospital or clinic, you can use:  An automated blood pressure machine at a pharmacy.  A home blood pressure monitor.  If you are 54-43 years old, ask your health care provider if you should take aspirin to prevent heart disease.  Diabetes screening involves taking a blood sample to check your fasting blood sugar level. This should be done once every 3 years after age 45 if you are at a normal weight and without risk factors for diabetes. Testing should be considered at a younger age or be carried out more frequently if you are overweight and have at least 1 risk factor for diabetes.  Colorectal cancer can be detected and often prevented. Most routine colorectal cancer screening begins at the age of 52 and continues through age 75. However, your health care provider may recommend screening at an earlier age if you have risk factors for colon cancer. On a yearly basis, your health care provider may provide home test kits to check for hidden blood in the stool. A small camera at the end of a tube may be used to directly examine the  colon (sigmoidoscopy or colonoscopy) to detect the earliest forms of colorectal cancer. Talk to your health care provider about this at age 57 when routine screening begins. A direct exam of the colon should be repeated every 5-10 years through age 79, unless early forms of precancerous polyps or small growths are found.  People who are at an increased risk for hepatitis B should be screened for this virus. You are considered at high risk for hepatitis B if:  You were born in a country where hepatitis B occurs often. Talk with your health care provider about which countries are considered high risk.  Your parents were born in a high-risk country and you have not received a shot to protect against hepatitis B (hepatitis  B vaccine).  You have HIV or AIDS.  You use needles to inject street drugs.  You live with, or have sex with, someone who has hepatitis B.  You are a man who has sex with other men (MSM).  You get hemodialysis treatment.  You take certain medicines for conditions like cancer, organ transplantation, and autoimmune conditions.  Hepatitis C blood testing is recommended for all people born from [birth year] through 1963 and any individual with known risk factors for hepatitis C.  Healthy men should no longer receive prostate-specific antigen (PSA) blood tests as part of routine cancer screening. Talk to your health care provider about prostate cancer screening.  Testicular cancer screening is not recommended for adolescents or adult males who have no symptoms. Screening includes self-exam, a health care provider exam, and other screening tests. Consult with your health care provider about any symptoms you have or any concerns you have about testicular cancer.  Practice safe sex. Use condoms and avoid high-risk sexual practices to reduce the spread of sexually transmitted infections (STIs).  You should be screened for STIs, including gonorrhea and chlamydia if:  You are sexually  active and are younger than 24 years.  You are older than 24 years, and your health care provider tells you that you are at risk for this type of infection.  Your sexual activity has changed since you were last screened, and you are at an increased risk for chlamydia or gonorrhea. Ask your health care provider if you are at risk.  If you are at risk of being infected with HIV, it is recommended that you take a prescription medicine daily to prevent HIV infection. This is called pre-exposure prophylaxis (PrEP). You are considered at risk if:  You are a man who has sex with other men (MSM).  You are a heterosexual man who is sexually active with multiple partners.  You take drugs by injection.  You are sexually active with a partner who has HIV.  Talk with your health care provider about whether you are at high risk of being infected with HIV. If you choose to begin PrEP, you should first be tested for HIV. You should then be tested every 3 months for as long as you are taking PrEP.  Use sunscreen. Apply sunscreen liberally and repeatedly throughout the day. You should seek shade when your shadow is shorter than you. Protect yourself by wearing long sleeves, pants, a wide-brimmed hat, and sunglasses year round whenever you are outdoors.  Tell your health care provider of new moles or changes in moles, especially if there is a change in shape or color. Also, tell your health care provider if a mole is larger than the size of a pencil eraser.  A one-time screening for abdominal aortic aneurysm (AAA) and surgical repair of large AAAs by ultrasound is recommended for men aged 7-75 years who are current or former smokers.  Stay current with your vaccines (immunizations).   This information is not intended to replace advice given to you by your health care provider. Make sure you discuss any questions you have with your health care provider.   Document Released: 11/09/2007 Document Revised:  06/03/2014 Document Reviewed: 10/08/2010 Elsevier Interactive Patient Education [employer].

## 2015-09-20 NOTE — Progress Notes (Signed)
Patient ID: Mark Wilkinson, male   DOB: 29-Sep-1927, 82 y.o.   MRN: [id number]    Subjective:   Mark Wilkinson is a 82 y.o. male who presents for an Initial Medicare Annual Wellness Visit and to recheck diabetes.  Patient's wife is present with him today and he defers most medical history questions to her.  He has a diagnosis of dementia.   DM - BG has improved with dietary changes - A1c decreased from 7.2% to most recently was 6.0%.  HBG readings range from 81 (fasting) to 158 (post prandial).  Wife is checking BG every other day.   Review of Systems  Review of Systems  Constitutional: Negative.   HENT: Negative.   Eyes: Negative.   Respiratory: Negative.   Cardiovascular: Negative.   Gastrointestinal: Negative.   Genitourinary: Positive for frequency (related to BPH).  Musculoskeletal: Positive for joint pain (occ left knee pain).  Skin: Negative.   Neurological: Negative.   Endo/Heme/Allergies: Negative.   Psychiatric/Behavioral: Negative.        Current Medications (verified) Outpatient Encounter Prescriptions as of 09/20/2015  Medication Sig  . acetaminophen (TYLENOL) 500 MG tablet Take 1 tablet (500 mg total) by mouth every 6 (six) hours as needed.  Marland Kitchen allopurinol (ZYLOPRIM) 300 MG tablet TAKE 1 TABLET DAILY  . atenolol (TENORMIN) 25 MG tablet Take 1 tablet (25 mg total) by mouth every evening.  Marland Kitchen atorvastatin (LIPITOR) 40 MG tablet TAKE 1 TABLET BY MOUTH EVERY DAY  . B Complex-C (SUPER B COMPLEX PO) Take 1 tablet by mouth at bedtime.   . Cholecalciferol (VITAMIN D3) 1000 UNITS CAPS Take 2,000 Units by mouth every morning.   . diltiazem (CARDIZEM CD) 120 MG 24 hr capsule Take 1 capsule (120 mg total) by mouth every morning.  . docusate sodium (COLACE) 100 MG capsule Take 1 capsule (100 mg total) by mouth 2 (two) times daily.  Marland Kitchen esomeprazole (NEXIUM) 40 MG capsule TAKE 1 CAPSULE DAILY  . ferrous sulfate 325 (65 FE) MG tablet Take 1 tablet (325 mg total) by mouth daily with  breakfast.  . furosemide (LASIX) 20 MG tablet Take 1 tablet (20 mg total) by mouth daily.  Marland Kitchen glucose blood test strip Use to check BG once daily.  Please dispsense One Touch Verio Strips.  Dx:  Debbora Dus.9  . Multiple Vitamin (MULTIVITAMIN WITH MINERALS) TABS Take 1 tablet by mouth every morning.   . Omega-3 Fatty Acids (FISH OIL) 1000 MG CAPS Take 1,000 mg by mouth 2 (two) times daily.   [name] DELICA LANCETS 99991111 MISC Use to check BG once daily. Dx:  E11.9.  Marland Kitchen polyethylene glycol (MIRALAX / GLYCOLAX) packet Take 17 g by mouth 2 (two) times daily.  . Ranibizumab (LUCENTIS) 0.3 MG/0.05ML SOLN Place 1 application into the left eye. Injection q 6-8 weeks  . RAPAFLO 8 MG CAPS capsule TAKE 1 CAPSULE EVERY DAY WITH FOOD  . rivaroxaban (XARELTO) 20 MG TABS tablet Take 1 tablet (20 mg total) by mouth daily.  . [DISCONTINUED] XARELTO 20 MG TABS tablet TAKE 1 TABLET BY MOUTH EVERY DAY WITH SUPPER   No facility-administered encounter medications on file as of 09/20/2015.    Allergies (verified) Penicillins   History: Past Medical History  Diagnosis Date  . Essential hypertension, benign   . Other and unspecified hyperlipidemia   . Diaphragmatic hernia without mention of obstruction or gangrene   . Gout, unspecified   . Calculus of kidney   . Asymptomatic varicose veins   .  Lumbago   . PONV (postoperative nausea and vomiting)   . Dementia   . GERD (gastroesophageal reflux disease)   . Arthritis     "knees, hips, back" (03/18/2013)  . Chronic lower back pain   . Atrial fibrillation ([place])   . Dysrhythmia     a-fib  . History of hiatal hernia   . Cataract    Past Surgical History  Procedure Laterality Date  . Appendectomy    . Cholecystectomy    . Cystoscopy with retrograde pyelogram, ureteroscopy and stent placement Left 08/13/2012    Procedure: [place], URETEROSCOPY AND STENT PLACEMENT;  Surgeon: Alexis Frock, MD;  Location: [facility];   Service: Urology;  Laterality: Left;  . Cataract extraction w/ intraocular lens implant Left 2000's  . Total knee arthroplasty Right 12/08/2012    Procedure: RIGHT TOTAL KNEE ARTHROPLASTY;  Surgeon: [name], MD;  Location: [facility];  Service: Orthopedics;  Laterality: Right;  . Tonsillectomy and adenoidectomy      "no tubes" (03/18/2013)  . Toe amputation Right ?1970's    "shot his toe off in hunting accident" (03/18/2013) - second toe  . Cystoscopy w/ stone manipulation      "twice" (03/18/2013)  . Total knee arthroplasty Left 01/16/2015    Procedure: TOTAL LEFT KNEE ARTHROPLASTY;  Surgeon: Paralee Cancel, MD;  Location: [facility];  Service: Orthopedics;  Laterality: Left;  Marland Kitchen Eye surgery     Family History  Problem Relation Age of Onset  . Heart disease Mother     Died in her 25s  . Hypertension Mother   . Stroke Father   . CAD Brother     MI 39s  . Heart disease Brother    Social History   Occupational History  . Retired Development worker, community carrier    Social History Main Topics  . Smoking status: Former Smoker -- 0.12 packs/day for 10 years    Types: Cigarettes    Quit date: 12/11/1960  . Smokeless tobacco: Never Used  . Alcohol Use: No  . Drug Use: No  . Sexual Activity: No    Do you feel safe at home?  Yes Are there smokers in your home (other than you)? No  Dietary issues and exercise activities discussed: Current Exercise Habits: Home exercise routine, Type of exercise: walking, Time (Minutes): 10, Frequency (Times/Week): 3, Weekly Exercise (Minutes/Week): 30, Intensity: Mild  Current Dietary habits:  Doing a great job decreasing portion sizes and limiting sugar and high CHO foods.    Cardiac Risk Factors include: advanced age (>57men, >41 women);diabetes mellitus;dyslipidemia;male gender;hypertension;sedentary lifestyle  Objective:    Today's Vitals   09/20/15 0928  BP: 120/80  Pulse: 65  Height: 5\' 11"  (1.803 m) - reports max adult height of 6' to 6\' 1"   Weight: 194 lb  (87.998 kg)  PainSc: 2   PainLoc: Knee   Body mass index is 27.07 kg/(m^2).   Posture is positive for kyphosis.    DEXA Results Date of Test T-Score for AP Spine L1-L4 T-Score for Total Left Hip T-Score for Total Right Hip  09/20/2015 4.2 -0.6 -1.2  03/16/2008 4.8 -0.1 -0.2             FRAX 10 year estimate: Total FX risk:  6.3%  (consider medication if >/= 20%) Hip FX risk:  2.4%  (consider medication if >/= 3%)  Activities of Daily Living In your present state of health, do you have any difficulty performing the following activities: 09/20/2015 01/25/2015  Hearing? N Y  Vision? N Y  Difficulty concentrating or making decisions? Tempie Donning  Walking or climbing stairs? Y Y  Dressing or bathing? N Y  Doing errands, shopping? N Y  Conservation officer, nature and eating ? N -  Using the Toilet? N -  In the past six months, have you accidently leaked urine? N -  Do you have problems with loss of bowel control? N -  Managing your Medications? N -  Managing your Finances? N -  Housekeeping or managing your Housekeeping? N -     Depression Screen PHQ 2/9 Scores 09/20/2015 08/04/2015 05/31/2015 03/17/2015  PHQ - 2 Score 0 0 0 0     Fall Risk Fall Risk  09/20/2015 08/04/2015 05/31/2015 03/17/2015 01/23/2015  Falls in the past year? Yes No No No No  Number falls in past yr: 1 - - - -  Injury with Fall? No - - - -  Follow up Falls prevention discussed - - - -    Cognitive Function: No flowsheet data found.  Immunizations and Health Maintenance Immunization History  Administered Date(s) Administered  . Influenza Whole 02/24/2010  . Influenza, High Dose Seasonal PF 03/17/2015  . Influenza,inj,Quad PF,36+ Mos 03/03/2013, 03/09/2014  . Pneumococcal Conjugate-13 03/24/2013  . Pneumococcal Polysaccharide-23 09/24/1996  . Td 08/25/2005  . Zoster 08/26/2006   Health Maintenance Due  Topic Date Due  . OPHTHALMOLOGY EXAM  07/25/2009    Patient Care Team: Chipper Herb, MD as PCP - General (Family  Medicine) Alexis Frock, MD (Urology) Paralee Cancel, MD (Orthopedic Surgery) Clent Jacks, MD (Ophthalmology) Hurman Horn, MD (Ophthalmology) Minus Breeding, MD as Consulting Physician (Cardiology)  Indicate any recent Medical Services you may have received from other than [facility] providers in the past year (date may be approximate).    Assessment:    Annual Wellness Visit  Type 2 DM, controlled with diet - diagnosed 05/2015 Osteopenia with low FRAX extimate   Screening Tests Health Maintenance  Topic Date Due  . OPHTHALMOLOGY EXAM  07/25/2009  . INFLUENZA VACCINE  12/26/2015  . HEMOGLOBIN A1C  01/27/2016  . FOOT EXAM  08/03/2016  . URINE MICROALBUMIN  08/03/2016  . TETANUS/TDAP  02/24/2021  . ZOSTAVAX  Completed  . PNA vac Low Risk Adult  Completed       Plan:   During the course of the visit Drayk was educated and counseled about the following appropriate screening and preventive services:   Vaccines to include Pneumoccal, Influenza,  Td, Zostavax - all vaccines currently UTD  Colorectal cancer screening - FOBT and colonoscopy are both UTD  Cardiovascular disease screening - EKG is UTD; lipids are at goal and patient is taking atrovastatin 40mg  qd;   BP was good today - wife to continue to check BP every other day and report if is less than 100/60 or if heart rate is less than 60.   Diabetes - continue with current dietary changes.  Great job.  May decreased HBG checks to 2 to 3 times per week.  Bone Denisty / Osteoporosis Screening - checked BMD today.  Discussed increasing calcium in diet and recommended daily MVI.   Glaucoma screening / Diabetic Eye Exam - patient sees Dr Zadie Rhine regularly but he has not notified Dr Zadie Rhine of new diagnosis of DM.  Mark Wilkinson has appointment with him May 2017 and will get Diabetic eye exam then.   Nutrition counseling - see above recommendations for DM and bones health  Advanced Directives - UTD  Fall prevention discussed - patient  to continue to use cane when ambulating.   Physical Activity - gave patient handout for chair exercise and he is to try to do chair exercises or walk (on flat paved areas) daily.  Orders Placed This Encounter  Procedures  . DG Bone Density    Order Specific Question:  Reason for Exam (SYMPTOM  OR DIAGNOSIS REQUIRED)    Answer:  loss of height    Order Specific Question:  Preferred imaging location?    Answer:  Internal     Patient Instructions (the written plan) were given to the patient.   Cherre Robins, [facility]   09/20/2015

## 2015-09-26 DIAGNOSIS — M531 Cervicobrachial syndrome: Secondary | ICD-10-CM | POA: Diagnosis not present

## 2015-09-26 DIAGNOSIS — M9905 Segmental and somatic dysfunction of pelvic region: Secondary | ICD-10-CM | POA: Diagnosis not present

## 2015-09-26 DIAGNOSIS — M9903 Segmental and somatic dysfunction of lumbar region: Secondary | ICD-10-CM | POA: Diagnosis not present

## 2015-09-26 DIAGNOSIS — M9904 Segmental and somatic dysfunction of sacral region: Secondary | ICD-10-CM | POA: Diagnosis not present

## 2015-09-26 DIAGNOSIS — M9902 Segmental and somatic dysfunction of thoracic region: Secondary | ICD-10-CM | POA: Diagnosis not present

## 2015-09-26 DIAGNOSIS — M9901 Segmental and somatic dysfunction of cervical region: Secondary | ICD-10-CM | POA: Diagnosis not present

## 2015-09-26 DIAGNOSIS — M25562 Pain in left knee: Secondary | ICD-10-CM | POA: Diagnosis not present

## 2015-09-27 ENCOUNTER — Telehealth: Payer: Self-pay | Admitting: Family Medicine

## 2015-09-27 MED ORDER — CLINDAMYCIN HCL 300 MG PO CAPS: ORAL_CAPSULE | ORAL | Status: DC

## 2015-09-27 NOTE — Telephone Encounter (Signed)
The 2014 Panel of the American Dental Association made the following clinical recommendation: In general, for patients with prosthetic joint implants, prophylactic antibiotics are not recommended prior to dental procedures to prevent prosthetic joint infection. The practitioner and patient should consider possible clinical circumstances that may suggest the presence of a significant medical risk in providing dental care without antibiotic prophylaxis, as well as the known risks of frequent or widespread antibiotic use. As part of the evidence-based approach to care, this clinical recommendation should be integrated with the practitioner's professional judgment and the patient's needs and preferences.  I would recommend holding warfarin but antibiotic prophylaxis is no longer required - if patient really feels he needs then clindamycin 300mg  take 2 capsules 60 minutes prior to procedure.

## 2015-09-27 NOTE — Telephone Encounter (Signed)
This pt is on xorelto 20,  not warfarin -- still hold? And for what time frame?  Pt and Dr Bobby Rumpf wants antibiotic  - we did the Clindamycin

## 2015-09-27 NOTE — Telephone Encounter (Signed)
Please have Tammy review this and patient is allergic to penicillin. He can hold the blood thinner for 1 day if necessary and restarted after the procedure and he'll need an antibiotic prescribed also.

## 2015-09-27 NOTE — Telephone Encounter (Signed)
Ok to hold Xarelto 24 hours prior to dental procedure.

## 2015-09-28 NOTE — Telephone Encounter (Signed)
LM 5/4-jhb

## 2015-10-01 ENCOUNTER — Other Ambulatory Visit: Payer: Self-pay | Admitting: Family Medicine

## 2015-10-09 DIAGNOSIS — D1801 Hemangioma of skin and subcutaneous tissue: Secondary | ICD-10-CM | POA: Diagnosis not present

## 2015-10-09 DIAGNOSIS — L570 Actinic keratosis: Secondary | ICD-10-CM | POA: Diagnosis not present

## 2015-10-09 DIAGNOSIS — L821 Other seborrheic keratosis: Secondary | ICD-10-CM | POA: Diagnosis not present

## 2015-10-09 DIAGNOSIS — L814 Other melanin hyperpigmentation: Secondary | ICD-10-CM | POA: Diagnosis not present

## 2015-10-09 DIAGNOSIS — D225 Melanocytic nevi of trunk: Secondary | ICD-10-CM | POA: Diagnosis not present

## 2015-10-10 DIAGNOSIS — M9905 Segmental and somatic dysfunction of pelvic region: Secondary | ICD-10-CM | POA: Diagnosis not present

## 2015-10-10 DIAGNOSIS — M9903 Segmental and somatic dysfunction of lumbar region: Secondary | ICD-10-CM | POA: Diagnosis not present

## 2015-10-10 DIAGNOSIS — M9904 Segmental and somatic dysfunction of sacral region: Secondary | ICD-10-CM | POA: Diagnosis not present

## 2015-10-10 DIAGNOSIS — M9901 Segmental and somatic dysfunction of cervical region: Secondary | ICD-10-CM | POA: Diagnosis not present

## 2015-10-10 DIAGNOSIS — M9902 Segmental and somatic dysfunction of thoracic region: Secondary | ICD-10-CM | POA: Diagnosis not present

## 2015-10-10 DIAGNOSIS — M5430 Sciatica, unspecified side: Secondary | ICD-10-CM | POA: Diagnosis not present

## 2015-10-10 DIAGNOSIS — M25562 Pain in left knee: Secondary | ICD-10-CM | POA: Diagnosis not present

## 2015-10-11 DIAGNOSIS — H34812 Central retinal vein occlusion, left eye, with macular edema: Secondary | ICD-10-CM | POA: Diagnosis not present

## 2015-10-16 ENCOUNTER — Encounter: Payer: Self-pay | Admitting: *Deleted

## 2015-10-24 DIAGNOSIS — M5430 Sciatica, unspecified side: Secondary | ICD-10-CM | POA: Diagnosis not present

## 2015-10-24 DIAGNOSIS — M9905 Segmental and somatic dysfunction of pelvic region: Secondary | ICD-10-CM | POA: Diagnosis not present

## 2015-10-24 DIAGNOSIS — M9904 Segmental and somatic dysfunction of sacral region: Secondary | ICD-10-CM | POA: Diagnosis not present

## 2015-10-24 DIAGNOSIS — M9903 Segmental and somatic dysfunction of lumbar region: Secondary | ICD-10-CM | POA: Diagnosis not present

## 2015-10-24 DIAGNOSIS — M9902 Segmental and somatic dysfunction of thoracic region: Secondary | ICD-10-CM | POA: Diagnosis not present

## 2015-10-24 DIAGNOSIS — M9901 Segmental and somatic dysfunction of cervical region: Secondary | ICD-10-CM | POA: Diagnosis not present

## 2015-10-24 DIAGNOSIS — M25562 Pain in left knee: Secondary | ICD-10-CM | POA: Diagnosis not present

## 2015-11-09 DIAGNOSIS — H34812 Central retinal vein occlusion, left eye, with macular edema: Secondary | ICD-10-CM | POA: Diagnosis not present

## 2015-11-20 DIAGNOSIS — M5430 Sciatica, unspecified side: Secondary | ICD-10-CM | POA: Diagnosis not present

## 2015-11-20 DIAGNOSIS — M9902 Segmental and somatic dysfunction of thoracic region: Secondary | ICD-10-CM | POA: Diagnosis not present

## 2015-11-20 DIAGNOSIS — M9901 Segmental and somatic dysfunction of cervical region: Secondary | ICD-10-CM | POA: Diagnosis not present

## 2015-11-20 DIAGNOSIS — M9903 Segmental and somatic dysfunction of lumbar region: Secondary | ICD-10-CM | POA: Diagnosis not present

## 2015-11-20 DIAGNOSIS — M9905 Segmental and somatic dysfunction of pelvic region: Secondary | ICD-10-CM | POA: Diagnosis not present

## 2015-11-20 DIAGNOSIS — M9904 Segmental and somatic dysfunction of sacral region: Secondary | ICD-10-CM | POA: Diagnosis not present

## 2015-11-20 DIAGNOSIS — M25562 Pain in left knee: Secondary | ICD-10-CM | POA: Diagnosis not present

## 2015-11-25 ENCOUNTER — Other Ambulatory Visit: Payer: Self-pay | Admitting: Family Medicine

## 2015-11-26 ENCOUNTER — Other Ambulatory Visit: Payer: Self-pay | Admitting: Family Medicine

## 2015-12-04 DIAGNOSIS — M9902 Segmental and somatic dysfunction of thoracic region: Secondary | ICD-10-CM | POA: Diagnosis not present

## 2015-12-04 DIAGNOSIS — M531 Cervicobrachial syndrome: Secondary | ICD-10-CM | POA: Diagnosis not present

## 2015-12-04 DIAGNOSIS — M9903 Segmental and somatic dysfunction of lumbar region: Secondary | ICD-10-CM | POA: Diagnosis not present

## 2015-12-04 DIAGNOSIS — M9905 Segmental and somatic dysfunction of pelvic region: Secondary | ICD-10-CM | POA: Diagnosis not present

## 2015-12-04 DIAGNOSIS — M25562 Pain in left knee: Secondary | ICD-10-CM | POA: Diagnosis not present

## 2015-12-04 DIAGNOSIS — M9904 Segmental and somatic dysfunction of sacral region: Secondary | ICD-10-CM | POA: Diagnosis not present

## 2015-12-04 DIAGNOSIS — M9901 Segmental and somatic dysfunction of cervical region: Secondary | ICD-10-CM | POA: Diagnosis not present

## 2015-12-05 ENCOUNTER — Other Ambulatory Visit: Payer: Medicare Other

## 2015-12-05 DIAGNOSIS — E559 Vitamin D deficiency, unspecified: Secondary | ICD-10-CM

## 2015-12-05 DIAGNOSIS — I10 Essential (primary) hypertension: Secondary | ICD-10-CM | POA: Diagnosis not present

## 2015-12-05 DIAGNOSIS — E785 Hyperlipidemia, unspecified: Secondary | ICD-10-CM

## 2015-12-05 DIAGNOSIS — E119 Type 2 diabetes mellitus without complications: Secondary | ICD-10-CM | POA: Diagnosis not present

## 2015-12-05 LAB — BAYER DCA HB A1C WAIVED: HB A1C: 5.9 % (ref <7.0)

## 2015-12-06 LAB — CBC WITH DIFFERENTIAL/PLATELET
BASOS: 0 %
Basophils Absolute: 0 10*3/uL (ref 0.0–0.2)
EOS (ABSOLUTE): 0.1 10*3/uL (ref 0.0–0.4)
EOS: 2 %
Hematocrit: 40.6 % (ref 37.5–51.0)
Hemoglobin: 13.1 g/dL (ref 12.6–17.7)
IMMATURE GRANS (ABS): 0 10*3/uL (ref 0.0–0.1)
IMMATURE GRANULOCYTES: 0 %
LYMPHS: 31 %
Lymphocytes Absolute: 2.4 10*3/uL (ref 0.7–3.1)
MCH: 28.7 pg (ref 26.6–33.0)
MCHC: 32.3 g/dL (ref 31.5–35.7)
MCV: 89 fL (ref 79–97)
Monocytes Absolute: 0.5 10*3/uL (ref 0.1–0.9)
Monocytes: 7 %
NEUTROS PCT: 60 %
Neutrophils Absolute: 4.6 10*3/uL (ref 1.4–7.0)
PLATELETS: 179 10*3/uL (ref 150–379)
RBC: 4.57 x10E6/uL (ref 4.14–5.80)
RDW: 15.5 % — AB (ref 12.3–15.4)
WBC: 7.8 10*3/uL (ref 3.4–10.8)

## 2015-12-06 LAB — HEPATIC FUNCTION PANEL
ALT: 20 IU/L (ref 0–44)
AST: 25 IU/L (ref 0–40)
Albumin: 3.7 g/dL (ref 3.5–4.7)
Alkaline Phosphatase: 108 IU/L (ref 39–117)
BILIRUBIN TOTAL: 0.6 mg/dL (ref 0.0–1.2)
BILIRUBIN, DIRECT: 0.2 mg/dL (ref 0.00–0.40)
TOTAL PROTEIN: 6.9 g/dL (ref 6.0–8.5)

## 2015-12-06 LAB — NMR, LIPOPROFILE
Cholesterol: 110 mg/dL (ref 100–199)
HDL CHOLESTEROL BY NMR: 44 mg/dL (ref >39)
HDL Particle Number: 24.5 umol/L — ABNORMAL LOW (ref >=30.5)
LDL PARTICLE NUMBER: 755 nmol/L (ref <1000)
LDL Size: 20.5 nm (ref >20.5)
LDL-C: 54 mg/dL (ref 0–99)
LP-IR Score: 30 (ref <=45)
Small LDL Particle Number: 380 nmol/L (ref <=527)
TRIGLYCERIDES BY NMR: 62 mg/dL (ref 0–149)

## 2015-12-06 LAB — BMP8+EGFR
BUN/Creatinine Ratio: 11 (ref 10–24)
BUN: 11 mg/dL (ref 8–27)
CALCIUM: 9.1 mg/dL (ref 8.6–10.2)
CHLORIDE: 100 mmol/L (ref 96–106)
CO2: 26 mmol/L (ref 18–29)
Creatinine, Ser: 1.02 mg/dL (ref 0.76–1.27)
GFR calc Af Amer: 77 mL/min/{1.73_m2} (ref >59)
GFR calc non Af Amer: 66 mL/min/{1.73_m2} (ref >59)
GLUCOSE: 115 mg/dL — AB (ref 65–99)
Potassium: 4.5 mmol/L (ref 3.5–5.2)
Sodium: 142 mmol/L (ref 134–144)

## 2015-12-06 LAB — VITAMIN D 25 HYDROXY (VIT D DEFICIENCY, FRACTURES): VIT D 25 HYDROXY: 70.2 ng/mL (ref 30.0–100.0)

## 2015-12-12 ENCOUNTER — Encounter: Payer: Self-pay | Admitting: Family Medicine

## 2015-12-12 ENCOUNTER — Ambulatory Visit (INDEPENDENT_AMBULATORY_CARE_PROVIDER_SITE_OTHER): Payer: Medicare Other | Admitting: Family Medicine

## 2015-12-12 VITALS — BP 85/61 | HR 91 | Temp 96.0°F | Ht 71.0 in | Wt 196.0 lb

## 2015-12-12 DIAGNOSIS — I10 Essential (primary) hypertension: Secondary | ICD-10-CM | POA: Diagnosis not present

## 2015-12-12 DIAGNOSIS — I481 Persistent atrial fibrillation: Secondary | ICD-10-CM

## 2015-12-12 DIAGNOSIS — E559 Vitamin D deficiency, unspecified: Secondary | ICD-10-CM

## 2015-12-12 DIAGNOSIS — E785 Hyperlipidemia, unspecified: Secondary | ICD-10-CM | POA: Diagnosis not present

## 2015-12-12 DIAGNOSIS — I4819 Other persistent atrial fibrillation: Secondary | ICD-10-CM

## 2015-12-12 DIAGNOSIS — E119 Type 2 diabetes mellitus without complications: Secondary | ICD-10-CM

## 2015-12-12 NOTE — Progress Notes (Signed)
Subjective:    Patient ID: CONNIE BISON, male    DOB: 09-Aug-1927, 82 y.o.   MRN: [id number]  HPI Pt here for follow up and management of chronic medical problems which includes diabetes. He is taking medications regularly.The patient has wife come to the visit together. He is had lab work done and these results will be reviewed with the patient and his wife during the visit. All this cholesterol numbers with advanced lipid testing were excellent except the good cholesterol was low. The hemoglobin A1c was good at 5.9%. He will continue with his current treatment. The blood sugar was elevated at 1:15 but his creatinine, the most important kidney function test was within normal limits and his electrolytes including sodium and chloride and potassium are good. The hemoglobin was also good and improved at 13.1 and the white blood cell count was not elevated with a good platelet count. All liver function tests were normal and the vitamin D level was excellent at 70.2. This patient has no specific complaints and use a never has any complaints. His wife says that he does complain with his knees at home and both of these have been replaced. He hasn't a somewhat sedentary lifestyle. He is pleasant and calm during the visit. He has been trying to cut his toenails instead of cutting the toenails cut his lower left big toe and she had trouble stopping it from bleeding. She knows in the future to keep any kind of toenail snips away from him and she will take him to the podiatrist this was discussed in front of him. He denies any chest pain or shortness of breath. He has no trouble with heartburn swallowing indigestion nausea vomiting diarrhea or blood in the stool. Since his hospitalization several months ago she is been adding thickening to his liquids and this is Him from having any aspiration problems. He is passing his water without problems and has seen the urologist in the past and continues to take medication to help  him with voiding which is Rapaflo.    Patient Active Problem List   Diagnosis Date Noted  . Osteopenia of the elderly 09/20/2015  . Memory impairment 08/04/2015  . Type 2 diabetes mellitus ([place]) 06/21/2015  . Chronic diastolic CHF (congestive heart failure) ([place]) 01/24/2015  . Atrial fibrillation with RVR ([place]) 01/23/2015  . Shortness of breath 01/23/2015  . Physical deconditioning 01/23/2015  . Dementia 01/23/2015  . Essential hypertension 01/23/2015  . Diastolic CHF ([place]) [id number]  . S/P left TKA 01/16/2015  . Atrial fibrillation, persistent ([place]) 02/25/2014  . Metabolic syndrome [id number]  . BPH (benign prostatic hyperplasia) 06/16/2013  . Chest pain 03/18/2013  . Acute encephalopathy 12/11/2012  . Hypotension, unspecified 12/11/2012  . Elevated troponin 12/11/2012  . Expected blood loss anemia 12/09/2012  . Obese 12/09/2012  . S/P right TKA 12/08/2012  . Preop cardiovascular exam 11/25/2012  . Essential hypertension, benign   . Other and unspecified hyperlipidemia   . Diaphragmatic hernia without mention of obstruction or gangrene   . Gout, unspecified   . Calculus of kidney   . [place], [place]   . Asymptomatic varicose veins   . Lumbago    Outpatient Encounter Prescriptions as of 12/12/2015  Medication Sig  . acetaminophen (TYLENOL) 500 MG tablet Take 1 tablet (500 mg total) by mouth every 6 (six) hours as needed.  Marland Kitchen allopurinol (ZYLOPRIM) 300 MG tablet TAKE 1 TABLET DAILY  . atenolol (TENORMIN) 25 MG tablet Take 1 tablet (25  mg total) by mouth every evening.  Marland Kitchen atorvastatin (LIPITOR) 40 MG tablet TAKE 1 TABLET BY MOUTH EVERY DAY  . B Complex-C (SUPER B COMPLEX PO) Take 1 tablet by mouth at bedtime.   . Cholecalciferol (VITAMIN D3) 1000 UNITS CAPS Take 2,000 Units by mouth every morning.   . clindamycin (CLEOCIN) 300 MG capsule Take 2 capsules 1 hour prior to procedure  . diltiazem (CARDIZEM CD) 120 MG 24 hr capsule Take 1 capsule (120 mg total) by  mouth every morning.  . docusate sodium (COLACE) 100 MG capsule Take 1 capsule (100 mg total) by mouth 2 (two) times daily.  Marland Kitchen esomeprazole (NEXIUM) 40 MG capsule TAKE 1 CAPSULE DAILY  . ferrous sulfate 325 (65 FE) MG tablet Take 1 tablet (325 mg total) by mouth daily with breakfast.  . furosemide (LASIX) 20 MG tablet Take 1 tablet (20 mg total) by mouth daily.  Marland Kitchen glucose blood test strip Use to check BG once daily.  Please dispsense One Touch Verio Strips.  Dx:  Debbora Dus.9  . Multiple Vitamin (MULTIVITAMIN WITH MINERALS) TABS Take 1 tablet by mouth every morning.   . Omega-3 Fatty Acids (FISH OIL) 1000 MG CAPS Take 1,000 mg by mouth 2 (two) times daily.   [name] DELICA LANCETS 99991111 MISC Use to check BG once daily. Dx:  E11.9.  Marland Kitchen polyethylene glycol (MIRALAX / GLYCOLAX) packet Take 17 g by mouth 2 (two) times daily.  . Ranibizumab (LUCENTIS) 0.3 MG/0.05ML SOLN Place 1 application into the left eye. Injection q 6-8 weeks  . RAPAFLO 8 MG CAPS capsule TAKE 1 CAPSULE EVERY DAY WITH FOOD  . XARELTO 20 MG TABS tablet TAKE 1 TABLET BY MOUTH EVERY DAY WITH SUPPER  . [DISCONTINUED] rivaroxaban (XARELTO) 20 MG TABS tablet Take 1 tablet (20 mg total) by mouth daily.  . [DISCONTINUED] diltiazem (CARDIZEM CD) 120 MG 24 hr capsule Take 1 capsule (120 mg total) by mouth daily.   No facility-administered encounter medications on file as of 12/12/2015.      Review of Systems  Constitutional: Negative.   HENT: Negative.   Eyes: Negative.   Respiratory: Negative.   Cardiovascular: Positive for leg swelling.  Gastrointestinal: Negative.   Endocrine: Negative.   Genitourinary: Negative.   Musculoskeletal: Negative.   Skin: Negative.   Allergic/Immunologic: Negative.   Neurological: Negative.   Hematological: Negative.   Psychiatric/Behavioral: Negative.        Objective:   Physical Exam  Constitutional: He is oriented to person, place, and time. He appears well-developed and well-nourished. No  distress.  HENT:  Head: Normocephalic and atraumatic.  Right Ear: External ear normal.  Left Ear: External ear normal.  Nose: Nose normal.  Mouth/Throat: Oropharynx is clear and moist. No oropharyngeal exudate.  Eyes: Conjunctivae and EOM are normal. Pupils are equal, round, and reactive to light. Right eye exhibits no discharge. Left eye exhibits no discharge. No scleral icterus.  Neck: Normal range of motion. Neck supple. No thyromegaly present.  Cardiovascular: Normal rate, regular rhythm, normal heart sounds and intact distal pulses.   No murmur heard. The heart is regular at 60/m  Pulmonary/Chest: Effort normal and breath sounds normal. No respiratory distress. He has no wheezes. He has no rales. He exhibits no tenderness.  Clear anteriorly and posteriorly  Abdominal: Soft. Bowel sounds are normal. He exhibits no mass. There is no tenderness. There is no rebound and no guarding.  No abdominal tenderness masses or bruits  Musculoskeletal: He exhibits no edema or tenderness.  The patient uses a cane for ambulation but had fairly good mobility with his lower extremities. He has a somewhat kyphotic posture.  Lymphadenopathy:    He has no cervical adenopathy.  Neurological: He is alert and oriented to person, place, and time. He has normal reflexes. No cranial nerve deficit.  Skin: Skin is warm and dry. No rash noted.  The patient does have a small laceration that is healed over on his left big toe tip where he was supposed to be cutting his toenails but cut the skin on his toe instead. This appears to be healing but the patient's wife will keep a Band-Aid and a slight amount of antibiotic ointment on this and keep a close check on it for any infection.  Psychiatric: He has a normal mood and affect. His behavior is normal. Judgment and thought content normal.  Nursing note and vitals reviewed.  BP 85/61 mmHg  Pulse 91  Temp(Src) 96 F (35.6 C) (Oral)  Ht 5\' 11"  (1.803 m)  Wt 196 lb  (88.905 kg)  BMI 27.35 kg/m2        Assessment & Plan:  1. Type 2 diabetes mellitus without complication, without long-term current use of insulin (HCC) -Continue with aggressive therapeutic lifestyle changes  2. Atrial fibrillation, persistent ([place]) -The heart today had a regular rate and rhythm and was not in atrial fibrillation.  3. Essential hypertension, benign -This patient blood pressure runs low. A repeat blood pressure in the office was 98/62. The readings from home were actually higher than this. We will not make any changes in his medication at this time.  4. Vitamin D deficiency -Continue with vitamin D replacement  5. Hyperlipemia -Continue with atorvastatin and as aggressive therapeutic lifestyle changes as possible  Patient Instructions                       Medicare Annual Wellness Visit  [place] and the medical providers at [facility] strive to bring you the best medical care.  In doing so we not only want to address your current medical conditions and concerns but also to detect new conditions early and prevent illness, disease and health-related problems.    Medicare offers a yearly Wellness Visit which allows our clinical staff to assess your need for preventative services including immunizations, lifestyle education, counseling to decrease risk of preventable diseases and screening for fall risk and other medical concerns.    This visit is provided free of charge (no copay) for all Medicare recipients. The clinical pharmacists at [facility] have begun to conduct these Wellness Visits which will also include a thorough review of all your medications.    As you primary medical provider recommend that you make an appointment for your Annual Wellness Visit if you have not done so already this year.  You may set up this appointment before you leave today or you may call back [phone]) and schedule an appointment.   Please make sure when you call that you mention that you are scheduling your Annual Wellness Visit with the clinical pharmacist so that the appointment may be made for the proper length of time.     Continue current medications. Continue good therapeutic lifestyle changes which include good diet and exercise. Fall precautions discussed with patient. If an FOBT was given today- please return it to our front desk. If you are over 75 years old - you may need Prevnar 3 or the adult  Pneumonia vaccine.  **Flu shots are available--- please call and schedule a FLU-CLINIC appointment**  After your visit with [place] today you will receive a survey in the mail or online from [employer] regarding your care with [place]. Please take a moment to fill this out. Your feedback is very important to [place] as you can help [place] better understand your patient needs as well as improve your experience and satisfaction. WE CARE ABOUT YOU!!!   Stay as active as possible Be careful cutting you nails In the future consider getting a podiatrist to cut the nails instead of the patient    Arrie Senate MD

## 2015-12-12 NOTE — Patient Instructions (Addendum)
Medicare Annual Wellness Visit  [facility] and the medical providers at [facility] strive to bring you the best medical care.  In doing so we not only want to address your current medical conditions and concerns but also to detect new conditions early and prevent illness, disease and health-related problems.    Medicare offers a yearly Wellness Visit which allows our clinical staff to assess your need for preventative services including immunizations, lifestyle education, counseling to decrease risk of preventable diseases and screening for fall risk and other medical concerns.    This visit is provided free of charge (no copay) for all Medicare recipients. The clinical pharmacists at [facility] have begun to conduct these Wellness Visits which will also include a thorough review of all your medications.    As you primary medical provider recommend that you make an appointment for your Annual Wellness Visit if you have not done so already this year.  You may set up this appointment before you leave today or you may call back [phone] and schedule an appointment.  Please make sure when you call that you mention that you are scheduling your Annual Wellness Visit with the clinical pharmacist so that the appointment may be made for the proper length of time.     Continue current medications. Continue good therapeutic lifestyle changes which include good diet and exercise. Fall precautions discussed with patient. If an FOBT was given today- please return it to our front desk. If you are over 24 years old - you may need Prevnar 70 or the adult Pneumonia vaccine.  **Flu shots are available--- please call and schedule a FLU-CLINIC appointment**  After your visit with [place] today you will receive a survey in the mail or online from [employer] regarding your care with [place]. Please take a moment to fill this out. Your feedback is very  important to [place] as you can help [place] better understand your patient needs as well as improve your experience and satisfaction. WE CARE ABOUT YOU!!!   Stay as active as possible Be careful cutting you nails In the future consider getting a podiatrist to cut the nails instead of the patient

## 2015-12-13 ENCOUNTER — Ambulatory Visit: Payer: Medicare Other | Admitting: Family Medicine

## 2015-12-14 DIAGNOSIS — H34812 Central retinal vein occlusion, left eye, with macular edema: Secondary | ICD-10-CM | POA: Diagnosis not present

## 2015-12-16 ENCOUNTER — Other Ambulatory Visit: Payer: Self-pay | Admitting: Family Medicine

## 2015-12-18 DIAGNOSIS — M9903 Segmental and somatic dysfunction of lumbar region: Secondary | ICD-10-CM | POA: Diagnosis not present

## 2015-12-18 DIAGNOSIS — M9904 Segmental and somatic dysfunction of sacral region: Secondary | ICD-10-CM | POA: Diagnosis not present

## 2015-12-18 DIAGNOSIS — M531 Cervicobrachial syndrome: Secondary | ICD-10-CM | POA: Diagnosis not present

## 2015-12-18 DIAGNOSIS — M25562 Pain in left knee: Secondary | ICD-10-CM | POA: Diagnosis not present

## 2015-12-18 DIAGNOSIS — M9902 Segmental and somatic dysfunction of thoracic region: Secondary | ICD-10-CM | POA: Diagnosis not present

## 2015-12-18 DIAGNOSIS — M9901 Segmental and somatic dysfunction of cervical region: Secondary | ICD-10-CM | POA: Diagnosis not present

## 2015-12-18 DIAGNOSIS — M9905 Segmental and somatic dysfunction of pelvic region: Secondary | ICD-10-CM | POA: Diagnosis not present

## 2015-12-20 ENCOUNTER — Other Ambulatory Visit: Payer: Self-pay | Admitting: Family Medicine

## 2015-12-27 DIAGNOSIS — Z96653 Presence of artificial knee joint, bilateral: Secondary | ICD-10-CM | POA: Diagnosis not present

## 2015-12-27 DIAGNOSIS — Z96652 Presence of left artificial knee joint: Secondary | ICD-10-CM | POA: Diagnosis not present

## 2015-12-27 DIAGNOSIS — Z96651 Presence of right artificial knee joint: Secondary | ICD-10-CM | POA: Diagnosis not present

## 2015-12-27 DIAGNOSIS — Z471 Aftercare following joint replacement surgery: Secondary | ICD-10-CM | POA: Diagnosis not present

## 2015-12-31 ENCOUNTER — Other Ambulatory Visit: Payer: Self-pay | Admitting: Family Medicine

## 2016-01-01 DIAGNOSIS — M9902 Segmental and somatic dysfunction of thoracic region: Secondary | ICD-10-CM | POA: Diagnosis not present

## 2016-01-01 DIAGNOSIS — M531 Cervicobrachial syndrome: Secondary | ICD-10-CM | POA: Diagnosis not present

## 2016-01-01 DIAGNOSIS — M9904 Segmental and somatic dysfunction of sacral region: Secondary | ICD-10-CM | POA: Diagnosis not present

## 2016-01-01 DIAGNOSIS — M25562 Pain in left knee: Secondary | ICD-10-CM | POA: Diagnosis not present

## 2016-01-01 DIAGNOSIS — M9905 Segmental and somatic dysfunction of pelvic region: Secondary | ICD-10-CM | POA: Diagnosis not present

## 2016-01-01 DIAGNOSIS — M9901 Segmental and somatic dysfunction of cervical region: Secondary | ICD-10-CM | POA: Diagnosis not present

## 2016-01-01 DIAGNOSIS — M9903 Segmental and somatic dysfunction of lumbar region: Secondary | ICD-10-CM | POA: Diagnosis not present

## 2016-01-15 DIAGNOSIS — M9903 Segmental and somatic dysfunction of lumbar region: Secondary | ICD-10-CM | POA: Diagnosis not present

## 2016-01-15 DIAGNOSIS — M9904 Segmental and somatic dysfunction of sacral region: Secondary | ICD-10-CM | POA: Diagnosis not present

## 2016-01-15 DIAGNOSIS — M9905 Segmental and somatic dysfunction of pelvic region: Secondary | ICD-10-CM | POA: Diagnosis not present

## 2016-01-15 DIAGNOSIS — M531 Cervicobrachial syndrome: Secondary | ICD-10-CM | POA: Diagnosis not present

## 2016-01-15 DIAGNOSIS — M25562 Pain in left knee: Secondary | ICD-10-CM | POA: Diagnosis not present

## 2016-01-15 DIAGNOSIS — M9901 Segmental and somatic dysfunction of cervical region: Secondary | ICD-10-CM | POA: Diagnosis not present

## 2016-01-15 DIAGNOSIS — M9902 Segmental and somatic dysfunction of thoracic region: Secondary | ICD-10-CM | POA: Diagnosis not present

## 2016-01-23 ENCOUNTER — Other Ambulatory Visit: Payer: Self-pay | Admitting: Family Medicine

## 2016-01-23 DIAGNOSIS — H34812 Central retinal vein occlusion, left eye, with macular edema: Secondary | ICD-10-CM | POA: Diagnosis not present

## 2016-01-30 DIAGNOSIS — M9903 Segmental and somatic dysfunction of lumbar region: Secondary | ICD-10-CM | POA: Diagnosis not present

## 2016-01-30 DIAGNOSIS — M9902 Segmental and somatic dysfunction of thoracic region: Secondary | ICD-10-CM | POA: Diagnosis not present

## 2016-01-30 DIAGNOSIS — M9905 Segmental and somatic dysfunction of pelvic region: Secondary | ICD-10-CM | POA: Diagnosis not present

## 2016-01-30 DIAGNOSIS — M9901 Segmental and somatic dysfunction of cervical region: Secondary | ICD-10-CM | POA: Diagnosis not present

## 2016-01-30 DIAGNOSIS — M9904 Segmental and somatic dysfunction of sacral region: Secondary | ICD-10-CM | POA: Diagnosis not present

## 2016-01-30 DIAGNOSIS — M531 Cervicobrachial syndrome: Secondary | ICD-10-CM | POA: Diagnosis not present

## 2016-01-30 DIAGNOSIS — M25562 Pain in left knee: Secondary | ICD-10-CM | POA: Diagnosis not present

## 2016-02-12 ENCOUNTER — Other Ambulatory Visit: Payer: Self-pay | Admitting: Cardiology

## 2016-02-13 DIAGNOSIS — M9904 Segmental and somatic dysfunction of sacral region: Secondary | ICD-10-CM | POA: Diagnosis not present

## 2016-02-13 DIAGNOSIS — M9903 Segmental and somatic dysfunction of lumbar region: Secondary | ICD-10-CM | POA: Diagnosis not present

## 2016-02-13 DIAGNOSIS — M9905 Segmental and somatic dysfunction of pelvic region: Secondary | ICD-10-CM | POA: Diagnosis not present

## 2016-02-13 DIAGNOSIS — M25562 Pain in left knee: Secondary | ICD-10-CM | POA: Diagnosis not present

## 2016-02-13 DIAGNOSIS — M9902 Segmental and somatic dysfunction of thoracic region: Secondary | ICD-10-CM | POA: Diagnosis not present

## 2016-02-13 DIAGNOSIS — M9901 Segmental and somatic dysfunction of cervical region: Secondary | ICD-10-CM | POA: Diagnosis not present

## 2016-02-13 DIAGNOSIS — M531 Cervicobrachial syndrome: Secondary | ICD-10-CM | POA: Diagnosis not present

## 2016-02-19 ENCOUNTER — Ambulatory Visit (INDEPENDENT_AMBULATORY_CARE_PROVIDER_SITE_OTHER): Payer: Medicare Other

## 2016-02-19 ENCOUNTER — Other Ambulatory Visit: Payer: Self-pay

## 2016-02-19 ENCOUNTER — Telehealth: Payer: Self-pay | Admitting: Family Medicine

## 2016-02-19 DIAGNOSIS — Z23 Encounter for immunization: Secondary | ICD-10-CM | POA: Diagnosis not present

## 2016-02-19 MED ORDER — RIVAROXABAN 20 MG PO TABS: ORAL_TABLET | ORAL | 0 refills | Status: DC

## 2016-02-25 ENCOUNTER — Other Ambulatory Visit: Payer: Self-pay | Admitting: Family Medicine

## 2016-02-27 DIAGNOSIS — H401123 Primary open-angle glaucoma, left eye, severe stage: Secondary | ICD-10-CM | POA: Diagnosis not present

## 2016-02-27 DIAGNOSIS — H34812 Central retinal vein occlusion, left eye, with macular edema: Secondary | ICD-10-CM | POA: Diagnosis not present

## 2016-02-28 DIAGNOSIS — M25562 Pain in left knee: Secondary | ICD-10-CM | POA: Diagnosis not present

## 2016-02-28 DIAGNOSIS — M9904 Segmental and somatic dysfunction of sacral region: Secondary | ICD-10-CM | POA: Diagnosis not present

## 2016-02-28 DIAGNOSIS — M9905 Segmental and somatic dysfunction of pelvic region: Secondary | ICD-10-CM | POA: Diagnosis not present

## 2016-02-28 DIAGNOSIS — M9901 Segmental and somatic dysfunction of cervical region: Secondary | ICD-10-CM | POA: Diagnosis not present

## 2016-02-28 DIAGNOSIS — M9903 Segmental and somatic dysfunction of lumbar region: Secondary | ICD-10-CM | POA: Diagnosis not present

## 2016-02-28 DIAGNOSIS — M9902 Segmental and somatic dysfunction of thoracic region: Secondary | ICD-10-CM | POA: Diagnosis not present

## 2016-02-28 DIAGNOSIS — M531 Cervicobrachial syndrome: Secondary | ICD-10-CM | POA: Diagnosis not present

## 2016-03-07 ENCOUNTER — Encounter: Payer: Self-pay | Admitting: *Deleted

## 2016-03-13 DIAGNOSIS — M25562 Pain in left knee: Secondary | ICD-10-CM | POA: Diagnosis not present

## 2016-03-13 DIAGNOSIS — M9902 Segmental and somatic dysfunction of thoracic region: Secondary | ICD-10-CM | POA: Diagnosis not present

## 2016-03-13 DIAGNOSIS — M9903 Segmental and somatic dysfunction of lumbar region: Secondary | ICD-10-CM | POA: Diagnosis not present

## 2016-03-13 DIAGNOSIS — M9901 Segmental and somatic dysfunction of cervical region: Secondary | ICD-10-CM | POA: Diagnosis not present

## 2016-03-13 DIAGNOSIS — M9904 Segmental and somatic dysfunction of sacral region: Secondary | ICD-10-CM | POA: Diagnosis not present

## 2016-03-13 DIAGNOSIS — M9905 Segmental and somatic dysfunction of pelvic region: Secondary | ICD-10-CM | POA: Diagnosis not present

## 2016-03-13 DIAGNOSIS — M531 Cervicobrachial syndrome: Secondary | ICD-10-CM | POA: Diagnosis not present
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
## 2016-03-20 ENCOUNTER — Ambulatory Visit (INDEPENDENT_AMBULATORY_CARE_PROVIDER_SITE_OTHER): Payer: Medicare Other | Admitting: Cardiology

## 2016-03-20 ENCOUNTER — Encounter: Payer: Self-pay | Admitting: Cardiology

## 2016-03-20 VITALS — BP 102/80 | HR 71 | Ht 71.0 in | Wt 202.0 lb

## 2016-03-20 DIAGNOSIS — I481 Persistent atrial fibrillation: Secondary | ICD-10-CM | POA: Diagnosis not present

## 2016-03-20 DIAGNOSIS — I10 Essential (primary) hypertension: Secondary | ICD-10-CM | POA: Diagnosis not present

## 2016-03-20 DIAGNOSIS — I4819 Other persistent atrial fibrillation: Secondary | ICD-10-CM

## 2016-03-20 NOTE — Patient Instructions (Signed)

## 2016-03-27 DIAGNOSIS — M9903 Segmental and somatic dysfunction of lumbar region: Secondary | ICD-10-CM | POA: Diagnosis not present

## 2016-03-27 DIAGNOSIS — M9902 Segmental and somatic dysfunction of thoracic region: Secondary | ICD-10-CM | POA: Diagnosis not present

## 2016-03-27 DIAGNOSIS — M9904 Segmental and somatic dysfunction of sacral region: Secondary | ICD-10-CM | POA: Diagnosis not present

## 2016-03-27 DIAGNOSIS — M531 Cervicobrachial syndrome: Secondary | ICD-10-CM | POA: Diagnosis not present

## 2016-03-27 DIAGNOSIS — M9905 Segmental and somatic dysfunction of pelvic region: Secondary | ICD-10-CM | POA: Diagnosis not present

## 2016-03-27 DIAGNOSIS — M9901 Segmental and somatic dysfunction of cervical region: Secondary | ICD-10-CM | POA: Diagnosis not present

## 2016-03-27 DIAGNOSIS — M25562 Pain in left knee: Secondary | ICD-10-CM | POA: Diagnosis not present

## 2016-04-02 DIAGNOSIS — H34812 Central retinal vein occlusion, left eye, with macular edema: Secondary | ICD-10-CM | POA: Diagnosis not present

## 2016-04-09 ENCOUNTER — Ambulatory Visit (INDEPENDENT_AMBULATORY_CARE_PROVIDER_SITE_OTHER): Payer: Medicare Other | Admitting: Family Medicine

## 2016-04-09 DIAGNOSIS — E119 Type 2 diabetes mellitus without complications: Secondary | ICD-10-CM | POA: Diagnosis not present

## 2016-04-09 DIAGNOSIS — I10 Essential (primary) hypertension: Secondary | ICD-10-CM | POA: Diagnosis not present

## 2016-04-09 LAB — BAYER DCA HB A1C WAIVED: HB A1C: 5.9 % (ref <7.0)

## 2016-04-10 DIAGNOSIS — M9903 Segmental and somatic dysfunction of lumbar region: Secondary | ICD-10-CM | POA: Diagnosis not present

## 2016-04-10 DIAGNOSIS — M5030 Other cervical disc degeneration, unspecified cervical region: Secondary | ICD-10-CM | POA: Diagnosis not present

## 2016-04-10 DIAGNOSIS — M9901 Segmental and somatic dysfunction of cervical region: Secondary | ICD-10-CM | POA: Diagnosis not present

## 2016-04-10 DIAGNOSIS — M9905 Segmental and somatic dysfunction of pelvic region: Secondary | ICD-10-CM | POA: Diagnosis not present

## 2016-04-10 DIAGNOSIS — M25562 Pain in left knee: Secondary | ICD-10-CM | POA: Diagnosis not present

## 2016-04-10 DIAGNOSIS — M9904 Segmental and somatic dysfunction of sacral region: Secondary | ICD-10-CM | POA: Diagnosis not present

## 2016-04-10 DIAGNOSIS — M9902 Segmental and somatic dysfunction of thoracic region: Secondary | ICD-10-CM | POA: Diagnosis not present

## 2016-04-10 LAB — CBC WITH DIFFERENTIAL/PLATELET
Basophils Absolute: 0 10*3/uL (ref 0.0–0.2)
Basos: 0 %
EOS (ABSOLUTE): 0.1 10*3/uL (ref 0.0–0.4)
EOS: 2 %
HEMATOCRIT: 43.4 % (ref 37.5–51.0)
Hemoglobin: 14 g/dL (ref 12.6–17.7)
Immature Grans (Abs): 0 10*3/uL (ref 0.0–0.1)
Immature Granulocytes: 0 %
LYMPHS ABS: 2.3 10*3/uL (ref 0.7–3.1)
Lymphs: 29 %
MCH: 28.7 pg (ref 26.6–33.0)
MCHC: 32.3 g/dL (ref 31.5–35.7)
MCV: 89 fL (ref 79–97)
MONOS ABS: 0.5 10*3/uL (ref 0.1–0.9)
Monocytes: 7 %
NEUTROS PCT: 62 %
Neutrophils Absolute: 5 10*3/uL (ref 1.4–7.0)
PLATELETS: 177 10*3/uL (ref 150–379)
RBC: 4.88 x10E6/uL (ref 4.14–5.80)
RDW: 14.6 % (ref 12.3–15.4)
WBC: 8 10*3/uL (ref 3.4–10.8)

## 2016-04-10 LAB — NMR, LIPOPROFILE
Cholesterol: 114 mg/dL (ref 100–199)
HDL Cholesterol by NMR: 42 mg/dL (ref >39)
HDL PARTICLE NUMBER: 25.3 umol/L — AB (ref >=30.5)
LDL Particle Number: 788 nmol/L (ref <1000)
LDL Size: 20.6 nm (ref >20.5)
LDL-C: 61 mg/dL (ref 0–99)
SMALL LDL PARTICLE NUMBER: 351 nmol/L (ref <=527)
Triglycerides by NMR: 57 mg/dL (ref 0–149)

## 2016-04-10 LAB — BMP8+EGFR
BUN / CREAT RATIO: 14 (ref 10–24)
BUN: 14 mg/dL (ref 8–27)
CO2: 30 mmol/L — AB (ref 18–29)
CREATININE: 1 mg/dL (ref 0.76–1.27)
Calcium: 9.2 mg/dL (ref 8.6–10.2)
Chloride: 103 mmol/L (ref 96–106)
GFR calc Af Amer: 78 mL/min/{1.73_m2} (ref >59)
GFR calc non Af Amer: 68 mL/min/{1.73_m2} (ref >59)
GLUCOSE: 109 mg/dL — AB (ref 65–99)
POTASSIUM: 4.6 mmol/L (ref 3.5–5.2)
SODIUM: 146 mmol/L — AB (ref 134–144)

## 2016-04-16 ENCOUNTER — Encounter: Payer: Self-pay | Admitting: Family Medicine

## 2016-04-16 ENCOUNTER — Ambulatory Visit (INDEPENDENT_AMBULATORY_CARE_PROVIDER_SITE_OTHER): Payer: Medicare Other | Admitting: Family Medicine

## 2016-04-16 VITALS — BP 133/88 | HR 77 | Temp 96.7°F | Ht 71.0 in | Wt 202.0 lb

## 2016-04-16 DIAGNOSIS — I5032 Chronic diastolic (congestive) heart failure: Secondary | ICD-10-CM | POA: Diagnosis not present

## 2016-04-16 DIAGNOSIS — E559 Vitamin D deficiency, unspecified: Secondary | ICD-10-CM

## 2016-04-16 DIAGNOSIS — I481 Persistent atrial fibrillation: Secondary | ICD-10-CM

## 2016-04-16 DIAGNOSIS — E7800 Pure hypercholesterolemia, unspecified: Secondary | ICD-10-CM | POA: Diagnosis not present

## 2016-04-16 DIAGNOSIS — E119 Type 2 diabetes mellitus without complications: Secondary | ICD-10-CM

## 2016-04-16 DIAGNOSIS — I10 Essential (primary) hypertension: Secondary | ICD-10-CM | POA: Diagnosis not present

## 2016-04-16 DIAGNOSIS — R413 Other amnesia: Secondary | ICD-10-CM | POA: Diagnosis not present

## 2016-04-16 DIAGNOSIS — I4819 Other persistent atrial fibrillation: Secondary | ICD-10-CM

## 2016-04-16 NOTE — Patient Instructions (Addendum)
Medicare Annual Wellness Visit  [facility] and the medical providers at [facility] strive to bring you the best medical care.  In doing so we not only want to address your current medical conditions and concerns but also to detect new conditions early and prevent illness, disease and health-related problems.    Medicare offers a yearly Wellness Visit which allows our clinical staff to assess your need for preventative services including immunizations, lifestyle education, counseling to decrease risk of preventable diseases and screening for fall risk and other medical concerns.    This visit is provided free of charge (no copay) for all Medicare recipients. The clinical pharmacists at [facility] have begun to conduct these Wellness Visits which will also include a thorough review of all your medications.    As you primary medical provider recommend that you make an appointment for your Annual Wellness Visit if you have not done so already this year.  You may set up this appointment before you leave today or you may call back [phone] and schedule an appointment.  Please make sure when you call that you mention that you are scheduling your Annual Wellness Visit with the clinical pharmacist so that the appointment may be made for the proper length of time.     Continue current medications. Continue good therapeutic lifestyle changes which include good diet and exercise. Fall precautions discussed with patient. If an FOBT was given today- please return it to our front desk. If you are over 55 years old - you may need Prevnar 31 or the adult Pneumonia vaccine.  **Flu shots are available--- please call and schedule a FLU-CLINIC appointment**  After your visit with [place] today you will receive a survey in the mail or online from [employer] regarding your care with [place]. Please take a moment to fill this out. Your feedback is very  important to [place] as you can help [place] better understand your patient needs as well as improve your experience and satisfaction. WE CARE ABOUT YOU!!!  Continue follow-up with cardiology Monitor blood sugars periodically at home and bring these readings to the next visit Reduce the iron to 3 times weekly on Monday Wednesday and Friday and repeat CBC in about 6 weeks. If the hemoglobin remains stable we will discontinue iron at that time The patient's wife was encouraged to get him some mild support hose to wear and put on the first thing every morning.

## 2016-04-16 NOTE — Progress Notes (Signed)
Subjective:    Patient ID: Mark Wilkinson, male    DOB: 07-23-1927, 82 y.o.   MRN: [id number]  HPI Pt here for follow up and management of chronic medical problems which includes diabetes, hyperlipidemia, and hypertension. He is taking medications regularly.The patient comes today with his wife to the visit. He does complain of some swelling in his ankles although her weight is stable compared to the last visit. His has a rash on his left leg and has had some redness and bleeding in the private area. The patient comes to the visit today with his wife. As usual he has no complaints. He does have only 1 complaint and that is a visual 1. And he is being followed by a retinal specialist for this on a regular basis. Otherwise he has no complaints. She is concerned about discoloration in his left lower leg. She is also concerned about some redness around the penis. He denies any chest pain or shortness of breath. He denies any trouble with swallowing heartburn indigestion nausea vomiting diarrhea or blood in the stool. He is passing his water without problems. His recent lab work was reviewed with him and as a result of this we will reduce his iron to 3 days a week and have a repeat CBC in 6 weeks. His hemoglobin was excellent in the mid 14 range.    Patient Active Problem List   Diagnosis Date Noted  . Osteopenia of the elderly 09/20/2015  . Memory impairment 08/04/2015  . Type 2 diabetes mellitus ([place]) 06/21/2015  . Chronic diastolic CHF (congestive heart failure) ([place]) 01/24/2015  . Atrial fibrillation with RVR ([place]) 01/23/2015  . Shortness of breath 01/23/2015  . Physical deconditioning 01/23/2015  . Dementia 01/23/2015  . Essential hypertension 01/23/2015  . Diastolic CHF ([place]) [id number]  . S/P left TKA 01/16/2015  . Atrial fibrillation, persistent ([place]) 02/25/2014  . Metabolic syndrome [id number]  . BPH (benign prostatic hyperplasia) 06/16/2013  . Chest pain 03/18/2013  . Acute  encephalopathy 12/11/2012  . Hypotension, unspecified 12/11/2012  . Elevated troponin 12/11/2012  . Expected blood loss anemia 12/09/2012  . Obese 12/09/2012  . S/P right TKA 12/08/2012  . Preop cardiovascular exam 11/25/2012  . Essential hypertension, benign   . Other and unspecified hyperlipidemia   . Diaphragmatic hernia without mention of obstruction or gangrene   . Gout, unspecified   . Calculus of kidney   . [place], [place]   . Asymptomatic varicose veins   . Lumbago    Outpatient Encounter Prescriptions as of 04/16/2016  Medication Sig  . acetaminophen (TYLENOL) 500 MG tablet Take 1 tablet (500 mg total) by mouth every 6 (six) hours as needed.  Marland Kitchen allopurinol (ZYLOPRIM) 300 MG tablet Take 300 mg by mouth daily.  Marland Kitchen atenolol (TENORMIN) 25 MG tablet Take 1 tablet (25 mg total) by mouth every evening.  Marland Kitchen atorvastatin (LIPITOR) 40 MG tablet TAKE 1 TABLET BY MOUTH EVERY DAY  . B Complex-C (SUPER B COMPLEX PO) Take 1 tablet by mouth at bedtime.   . Cholecalciferol (VITAMIN D3) 1000 UNITS CAPS Take 2,000 Units by mouth every morning.   . diltiazem (CARDIZEM CD) 120 MG 24 hr capsule Take 1 capsule (120 mg total) by mouth every morning.  . docusate sodium (COLACE) 100 MG capsule Take 1 capsule (100 mg total) by mouth 2 (two) times daily. (Patient taking differently: Take 100 mg by mouth daily as needed. )  . esomeprazole (NEXIUM) 40 MG capsule TAKE 1 CAPSULE  DAILY  . ferrous sulfate 325 (65 FE) MG tablet Take 1 tablet (325 mg total) by mouth daily with breakfast.  . furosemide (LASIX) 20 MG tablet TAKE 1 TABLET (20 MG TOTAL) BY MOUTH DAILY.  Marland Kitchen glucose blood test strip Use to check BG once daily.  Please dispsense One Touch Verio Strips.  Dx:  Debbora Dus.9  . Multiple Vitamin (MULTIVITAMIN WITH MINERALS) TABS Take 1 tablet by mouth every morning.   . Omega-3 Fatty Acids (FISH OIL) 1000 MG CAPS Take 1,000 mg by mouth 2 (two) times daily.   [name] DELICA LANCETS 99991111 MISC Use to check  BG once daily. Dx:  E11.9.  . Ranibizumab (LUCENTIS) 0.3 MG/0.05ML SOLN Place 1 application into the left eye. Injection q 6-8 weeks  . rivaroxaban (XARELTO) 20 MG TABS tablet TAKE 1 TABLET BY MOUTH EVERY DAY WITH SUPPER  . silodosin (RAPAFLO) 8 MG CAPS capsule Take 8 mg by mouth daily with breakfast.  . [DISCONTINUED] clindamycin (CLEOCIN) 300 MG capsule Take 2 capsules 1 hour prior to procedure   No facility-administered encounter medications on file as of 04/16/2016.       Review of Systems  Constitutional: Negative.   HENT: Negative.   Eyes: Negative.   Respiratory: Negative.   Cardiovascular: Positive for leg swelling.  Gastrointestinal: Negative.   Endocrine: Negative.   Genitourinary: Negative.        Redness/ bleeding of privates  Musculoskeletal: Negative.   Skin: Positive for rash (left leg).  Allergic/Immunologic: Negative.   Neurological: Negative.   Hematological: Negative.   Psychiatric/Behavioral: Negative.        Objective:   Physical Exam  Constitutional: He is oriented to person, place, and time. He appears well-developed and well-nourished. No distress.  HENT:  Head: Normocephalic and atraumatic.  Right Ear: External ear normal.  Left Ear: External ear normal.  Nose: Nose normal.  Mouth/Throat: Oropharynx is clear and moist. No oropharyngeal exudate.  Eyes: Conjunctivae and EOM are normal. Pupils are equal, round, and reactive to light. Right eye exhibits no discharge. Left eye exhibits no discharge. No scleral icterus.  Neck: Normal range of motion. Neck supple. No thyromegaly present.  No bruits or thyromegaly  Cardiovascular: Normal rate, regular rhythm and intact distal pulses.   No murmur heard. Heart is only slightly irregular at 72/m  Pulmonary/Chest: Effort normal and breath sounds normal. No respiratory distress. He has no wheezes. He has no rales. He exhibits no tenderness.  Clear anteriorly and posteriorly  Abdominal: Soft. Bowel sounds are  normal. He exhibits no mass. There is no tenderness. There is no rebound and no guarding.  No abdominal tenderness masses bruits  Genitourinary: Rectum normal and penis normal.  Genitourinary Comments: Some slight redness and this coloration of the glans penis. The patient's wife was reassured about this and we will continue to monitor this.  Musculoskeletal: He exhibits no edema.  The patient uses a cane for ambulation as he is somewhat kyphotic.  Lymphadenopathy:    He has no cervical adenopathy.  Neurological: He is alert and oriented to person, place, and time. He has normal reflexes. No cranial nerve deficit.  Skin: Skin is warm and dry. Rash noted.  Rash on the penis was addressed and we will reassure both dust patient and the wife that we will continue to monitor this.  Psychiatric: He has a normal mood and affect. His behavior is normal. Judgment and thought content normal.  Nursing note and vitals reviewed.   BP 133/88 (BP  Location: Right Arm)   Pulse 77   Temp (!) 96.7 F (35.9 C) (Oral)   Ht 5\' 11"  (1.803 m)   Wt 202 lb (91.6 kg)   BMI 28.17 kg/m        Assessment & Plan:  1. Essential hypertension, benign -The blood pressure is good today at the home readings that were brought in were also good and the patient will continue with current treatment  2. Type 2 diabetes mellitus without complication, without long-term current use of insulin (HCC) -The hemoglobin A1c was good at 5.9% and the patient will continue with aggressive therapeutic lifestyle changes and he is currently on no treatment.  3. Atrial fibrillation, persistent ([place]) -The patient appeared to be in normal sinus rhythm with a slight irregularity today. No change in treatment is necessary.  4. Vitamin D deficiency -Continue current treatment  5. Pure hypercholesterolemia -Cholesterol numbers were excellent and he will continue with current treatment and aggressive therapeutic lifestyle changes  6.  Memory impairment -Memory issues appear to be stable he will continue with his namzeric  7. Chronic diastolic congestive heart failure ([place]) -Continue follow-up with cardiology and sodium restriction as much as possible  Patient Instructions                       Medicare Annual Wellness Visit  [facility] and the medical providers at [facility] strive to bring you the best medical care.  In doing so we not only want to address your current medical conditions and concerns but also to detect new conditions early and prevent illness, disease and health-related problems.    Medicare offers a yearly Wellness Visit which allows our clinical staff to assess your need for preventative services including immunizations, lifestyle education, counseling to decrease risk of preventable diseases and screening for fall risk and other medical concerns.    This visit is provided free of charge (no copay) for all Medicare recipients. The clinical pharmacists at [facility] have begun to conduct these Wellness Visits which will also include a thorough review of all your medications.    As you primary medical provider recommend that you make an appointment for your Annual Wellness Visit if you have not done so already this year.  You may set up this appointment before you leave today or you may call back [phone]) and schedule an appointment.  Please make sure when you call that you mention that you are scheduling your Annual Wellness Visit with the clinical pharmacist so that the appointment may be made for the proper length of time.     Continue current medications. Continue good therapeutic lifestyle changes which include good diet and exercise. Fall precautions discussed with patient. If an FOBT was given today- please return it to our front desk. If you are over 88 years old - you may need Prevnar 59 or the adult Pneumonia vaccine.  **Flu shots are  available--- please call and schedule a FLU-CLINIC appointment**  After your visit with [place] today you will receive a survey in the mail or online from [employer] regarding your care with [place]. Please take a moment to fill this out. Your feedback is very important to [place] as you can help [place] better understand your patient needs as well as improve your experience and satisfaction. WE CARE ABOUT YOU!!!  Continue follow-up with cardiology Monitor blood sugars periodically at home and bring these readings to the next visit Reduce the  iron to 3 times weekly on Monday Wednesday and Friday and repeat CBC in about 6 weeks. If the hemoglobin remains stable we will discontinue iron at that time The patient's wife was encouraged to get him some mild support hose to wear and put on the first thing every morning.   Arrie Senate MD

## 2016-05-01 DIAGNOSIS — M5030 Other cervical disc degeneration, unspecified cervical region: Secondary | ICD-10-CM | POA: Diagnosis not present

## 2016-05-01 DIAGNOSIS — M9901 Segmental and somatic dysfunction of cervical region: Secondary | ICD-10-CM | POA: Diagnosis not present

## 2016-05-01 DIAGNOSIS — M9904 Segmental and somatic dysfunction of sacral region: Secondary | ICD-10-CM | POA: Diagnosis not present

## 2016-05-01 DIAGNOSIS — M9903 Segmental and somatic dysfunction of lumbar region: Secondary | ICD-10-CM | POA: Diagnosis not present

## 2016-05-01 DIAGNOSIS — M25562 Pain in left knee: Secondary | ICD-10-CM | POA: Diagnosis not present

## 2016-05-01 DIAGNOSIS — M9902 Segmental and somatic dysfunction of thoracic region: Secondary | ICD-10-CM | POA: Diagnosis not present

## 2016-05-01 DIAGNOSIS — M9905 Segmental and somatic dysfunction of pelvic region: Secondary | ICD-10-CM | POA: Diagnosis not present

## 2016-05-07 DIAGNOSIS — H34812 Central retinal vein occlusion, left eye, with macular edema: Secondary | ICD-10-CM | POA: Diagnosis not present

## 2016-05-14 NOTE — Progress Notes (Signed)
Lab encounter only and patient was not seen by provider

## 2016-05-15 DIAGNOSIS — M9903 Segmental and somatic dysfunction of lumbar region: Secondary | ICD-10-CM | POA: Diagnosis not present

## 2016-05-15 DIAGNOSIS — M9902 Segmental and somatic dysfunction of thoracic region: Secondary | ICD-10-CM | POA: Diagnosis not present

## 2016-05-15 DIAGNOSIS — M5030 Other cervical disc degeneration, unspecified cervical region: Secondary | ICD-10-CM | POA: Diagnosis not present

## 2016-05-15 DIAGNOSIS — M9901 Segmental and somatic dysfunction of cervical region: Secondary | ICD-10-CM | POA: Diagnosis not present

## 2016-05-15 DIAGNOSIS — M9905 Segmental and somatic dysfunction of pelvic region: Secondary | ICD-10-CM | POA: Diagnosis not present

## 2016-05-15 DIAGNOSIS — M25562 Pain in left knee: Secondary | ICD-10-CM | POA: Diagnosis not present

## 2016-05-15 DIAGNOSIS — M9904 Segmental and somatic dysfunction of sacral region: Secondary | ICD-10-CM | POA: Diagnosis not present

## 2016-05-16 ENCOUNTER — Other Ambulatory Visit: Payer: Self-pay | Admitting: Family Medicine

## 2016-05-17 ENCOUNTER — Other Ambulatory Visit: Payer: Self-pay | Admitting: Family Medicine

## 2016-05-24 ENCOUNTER — Other Ambulatory Visit: Payer: Self-pay | Admitting: Cardiology

## 2016-05-24 NOTE — Telephone Encounter (Signed)
Rx(s) sent to pharmacy electronically.  

## 2016-05-29 DIAGNOSIS — M9901 Segmental and somatic dysfunction of cervical region: Secondary | ICD-10-CM | POA: Diagnosis not present

## 2016-05-29 DIAGNOSIS — M9905 Segmental and somatic dysfunction of pelvic region: Secondary | ICD-10-CM | POA: Diagnosis not present

## 2016-05-29 DIAGNOSIS — M25562 Pain in left knee: Secondary | ICD-10-CM | POA: Diagnosis not present

## 2016-05-29 DIAGNOSIS — M9902 Segmental and somatic dysfunction of thoracic region: Secondary | ICD-10-CM | POA: Diagnosis not present

## 2016-05-29 DIAGNOSIS — M5137 Other intervertebral disc degeneration, lumbosacral region: Secondary | ICD-10-CM | POA: Diagnosis not present

## 2016-05-29 DIAGNOSIS — M9904 Segmental and somatic dysfunction of sacral region: Secondary | ICD-10-CM | POA: Diagnosis not present

## 2016-05-29 DIAGNOSIS — M9903 Segmental and somatic dysfunction of lumbar region: Secondary | ICD-10-CM | POA: Diagnosis not present

## 2016-06-12 ENCOUNTER — Other Ambulatory Visit: Payer: Self-pay | Admitting: Family Medicine

## 2016-06-18 DIAGNOSIS — H401123 Primary open-angle glaucoma, left eye, severe stage: Secondary | ICD-10-CM | POA: Diagnosis not present

## 2016-06-18 DIAGNOSIS — H34812 Central retinal vein occlusion, left eye, with macular edema: Secondary | ICD-10-CM | POA: Diagnosis not present

## 2016-06-19 DIAGNOSIS — M9902 Segmental and somatic dysfunction of thoracic region: Secondary | ICD-10-CM | POA: Diagnosis not present

## 2016-06-19 DIAGNOSIS — M9905 Segmental and somatic dysfunction of pelvic region: Secondary | ICD-10-CM | POA: Diagnosis not present

## 2016-06-19 DIAGNOSIS — M9904 Segmental and somatic dysfunction of sacral region: Secondary | ICD-10-CM | POA: Diagnosis not present

## 2016-06-19 DIAGNOSIS — M9901 Segmental and somatic dysfunction of cervical region: Secondary | ICD-10-CM | POA: Diagnosis not present

## 2016-06-19 DIAGNOSIS — M9903 Segmental and somatic dysfunction of lumbar region: Secondary | ICD-10-CM | POA: Diagnosis not present

## 2016-06-19 DIAGNOSIS — M5137 Other intervertebral disc degeneration, lumbosacral region: Secondary | ICD-10-CM | POA: Diagnosis not present

## 2016-06-19 DIAGNOSIS — M25562 Pain in left knee: Secondary | ICD-10-CM | POA: Diagnosis not present

## 2016-06-23 ENCOUNTER — Other Ambulatory Visit: Payer: Self-pay | Admitting: Family Medicine

## 2016-07-03 DIAGNOSIS — M9901 Segmental and somatic dysfunction of cervical region: Secondary | ICD-10-CM | POA: Diagnosis not present

## 2016-07-03 DIAGNOSIS — M25562 Pain in left knee: Secondary | ICD-10-CM | POA: Diagnosis not present

## 2016-07-03 DIAGNOSIS — M5137 Other intervertebral disc degeneration, lumbosacral region: Secondary | ICD-10-CM | POA: Diagnosis not present

## 2016-07-03 DIAGNOSIS — M9905 Segmental and somatic dysfunction of pelvic region: Secondary | ICD-10-CM | POA: Diagnosis not present

## 2016-07-03 DIAGNOSIS — M9903 Segmental and somatic dysfunction of lumbar region: Secondary | ICD-10-CM | POA: Diagnosis not present

## 2016-07-03 DIAGNOSIS — M9904 Segmental and somatic dysfunction of sacral region: Secondary | ICD-10-CM | POA: Diagnosis not present

## 2016-07-03 DIAGNOSIS — M9902 Segmental and somatic dysfunction of thoracic region: Secondary | ICD-10-CM | POA: Diagnosis not present

## 2016-07-07 ENCOUNTER — Other Ambulatory Visit: Payer: Self-pay | Admitting: Family Medicine

## 2016-07-14 IMAGING — RF DG SWALLOWING FUNCTION - NRPT [facility]
1 series · 18 of 24 positions shown · non-contrast
Comparison: none

[Series 1: run · 16 acquisitions, 18 frames shown]
[im 1/16]
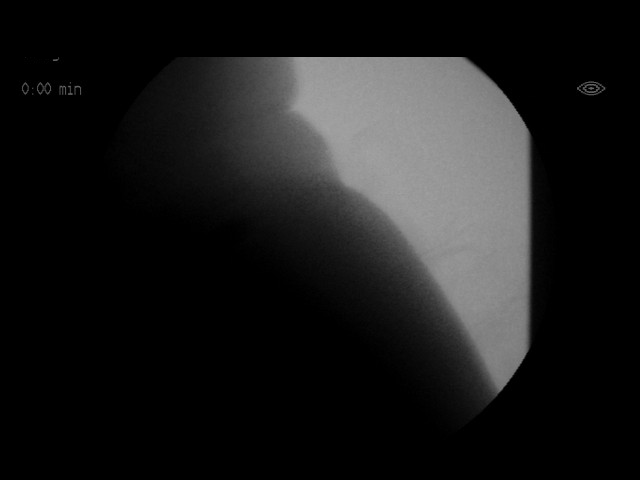
[im 2/16]
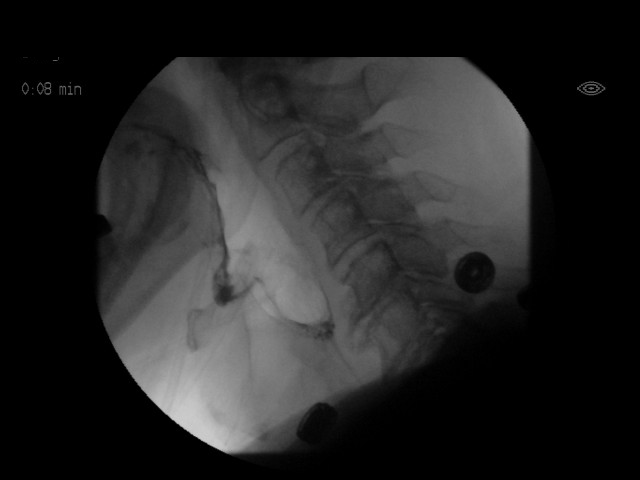
[im 3/16]
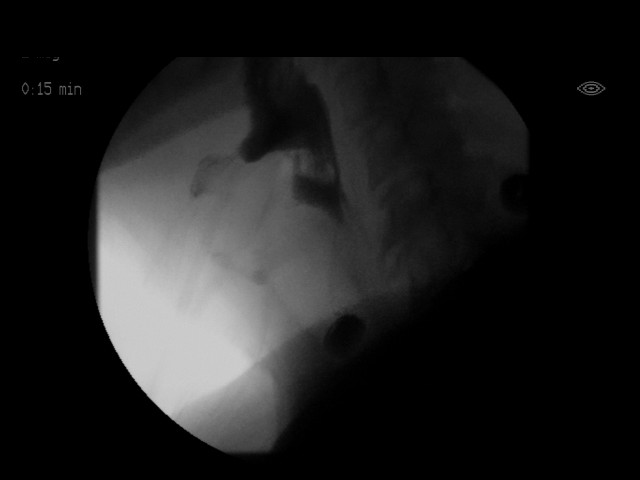
[im 3/16]
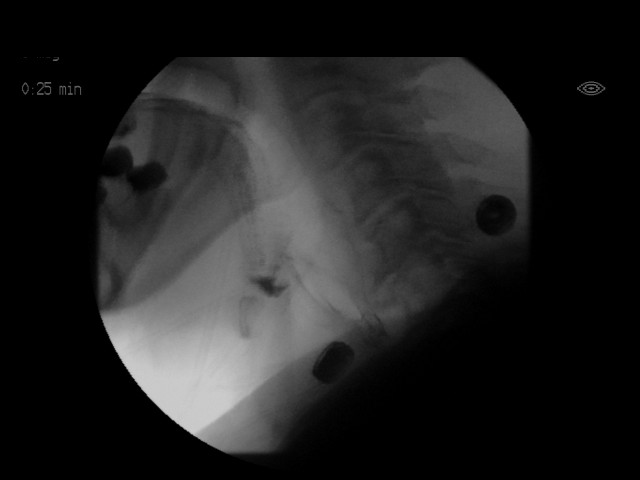
[im 5/16]
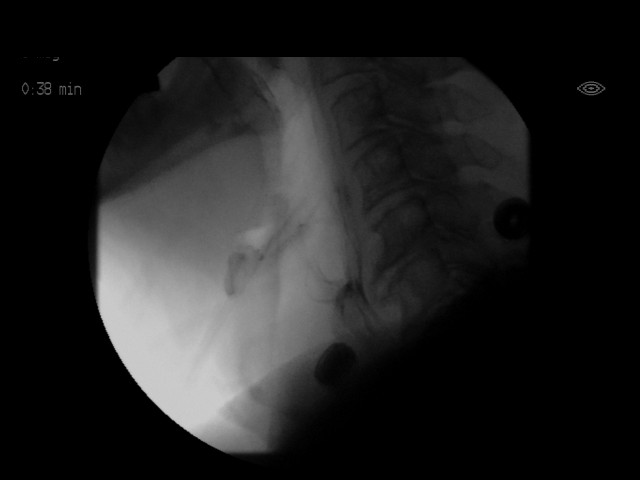
[im 5/16]
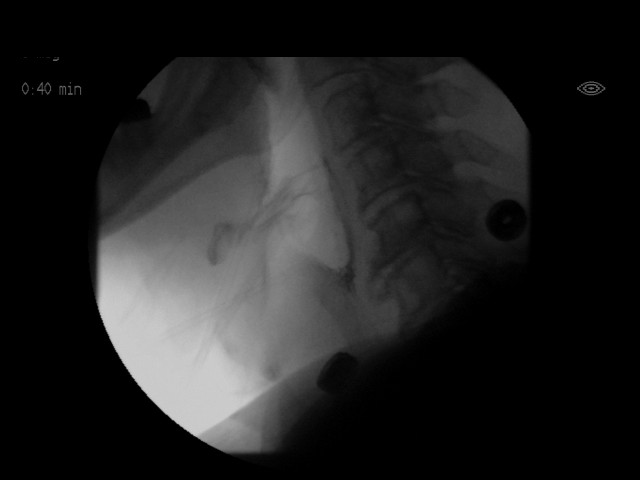
[im 6/16]
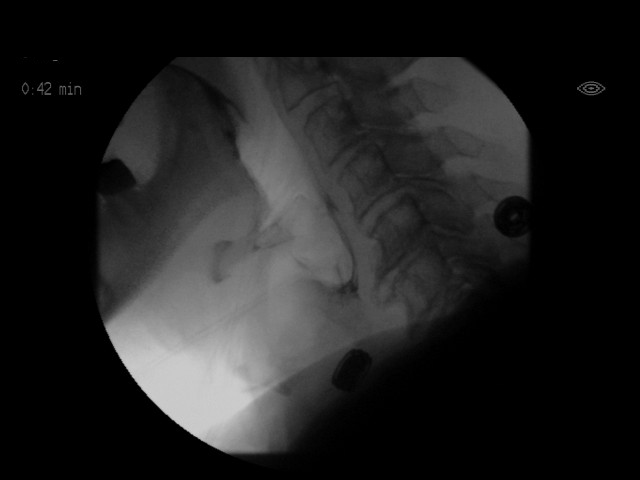
[im 7/16]
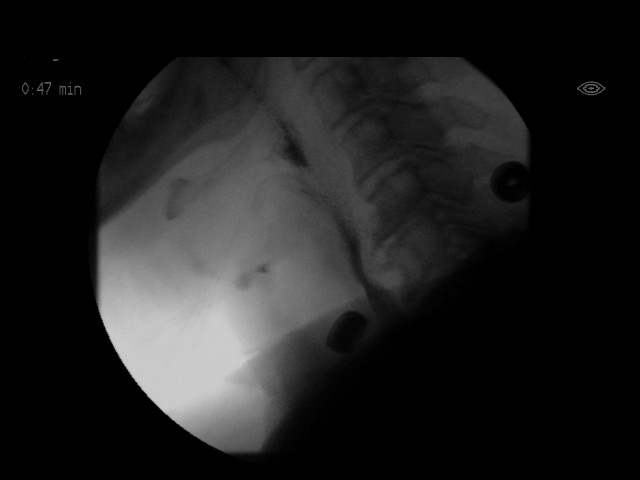
[im 8/16]
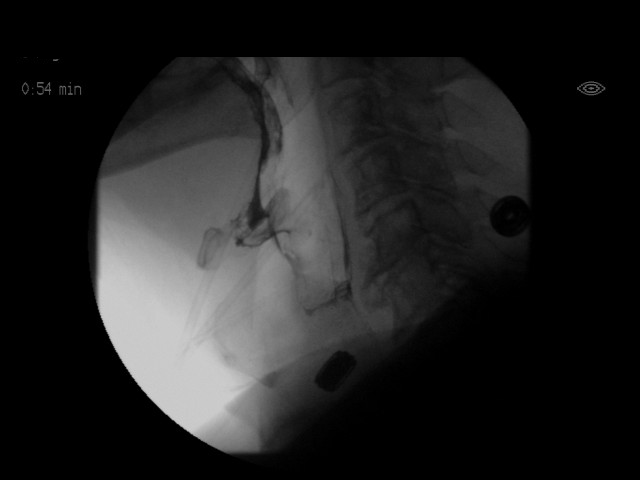
[im 9/16]
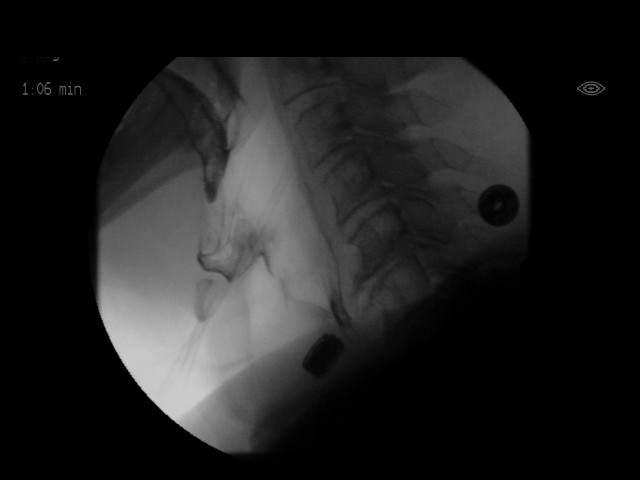
[im 10/16]
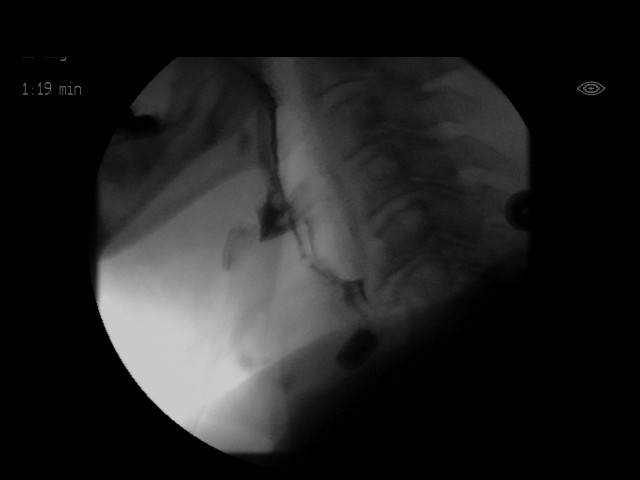
[im 11/16]
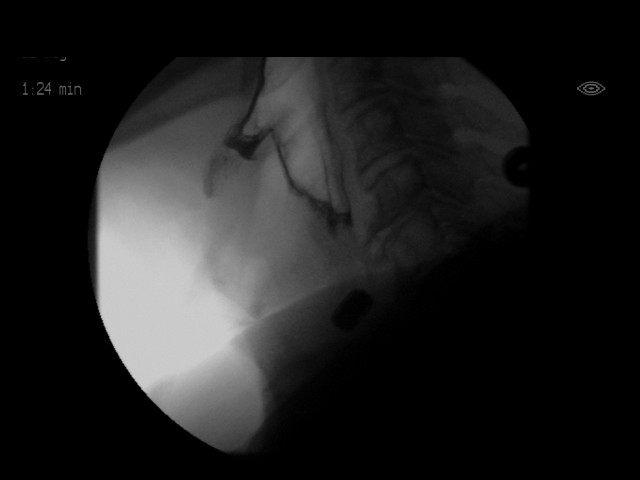
[im 12/16]
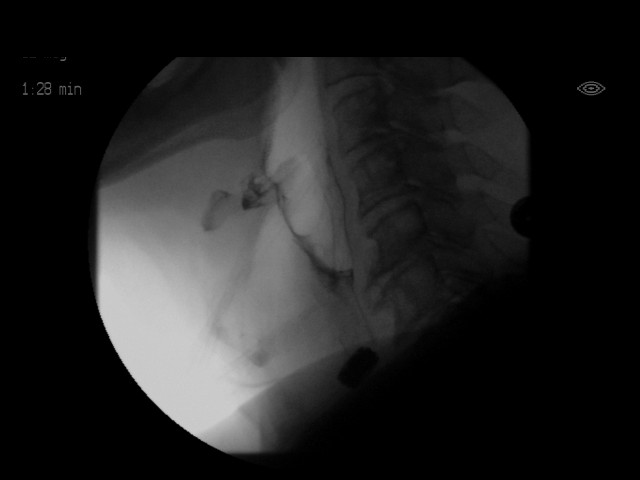
[im 13/16]
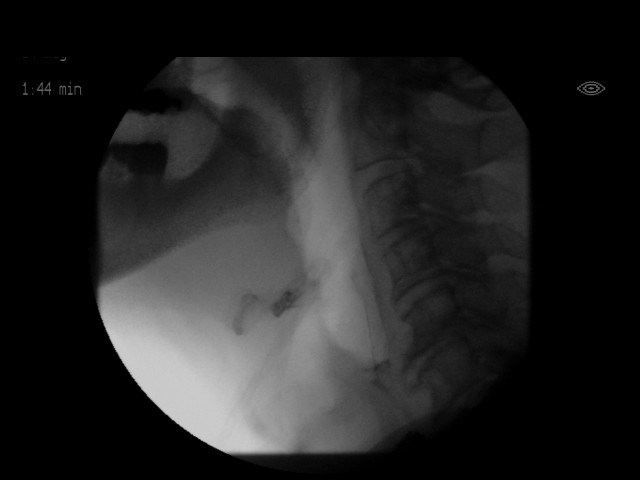
[im 14/16]
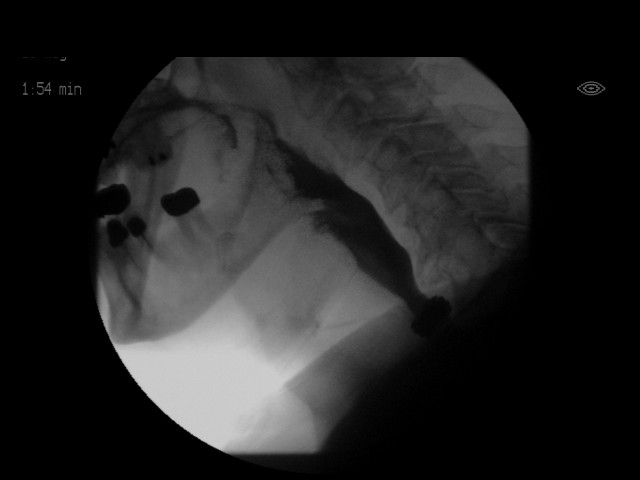
[im 14/16]
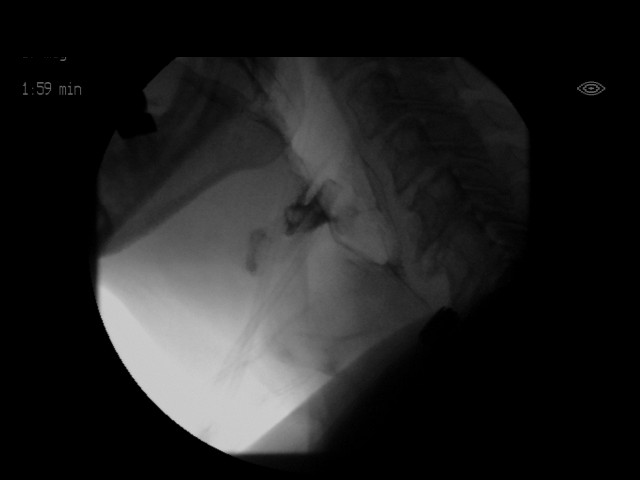
[im 16/16]
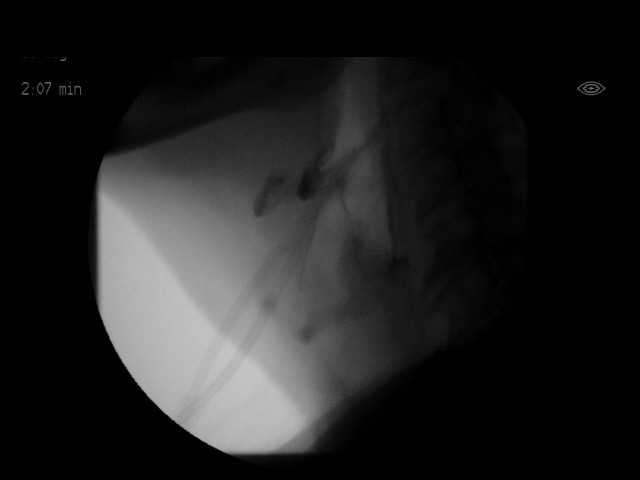
[im 16/16]
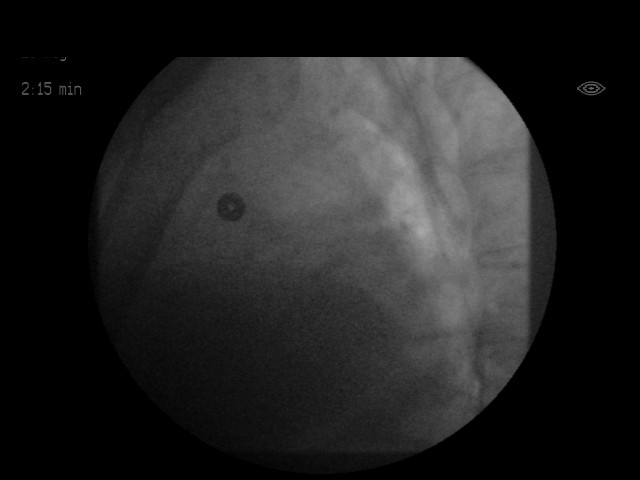

[18 of 24 positions shown; findings below may reference images not displayed]

FLUOROSCOPY FOR SWALLOWING FUNCTION STUDY:
Fluoroscopy was provided for swallowing function study, which was administered by a speech pathologist.  Final results and recommendations from this study are contained within the speech pathology report.

## 2016-07-17 DIAGNOSIS — M9903 Segmental and somatic dysfunction of lumbar region: Secondary | ICD-10-CM | POA: Diagnosis not present

## 2016-07-17 DIAGNOSIS — M5137 Other intervertebral disc degeneration, lumbosacral region: Secondary | ICD-10-CM | POA: Diagnosis not present

## 2016-07-17 DIAGNOSIS — M9902 Segmental and somatic dysfunction of thoracic region: Secondary | ICD-10-CM | POA: Diagnosis not present

## 2016-07-17 DIAGNOSIS — M9905 Segmental and somatic dysfunction of pelvic region: Secondary | ICD-10-CM | POA: Diagnosis not present

## 2016-07-17 DIAGNOSIS — M25562 Pain in left knee: Secondary | ICD-10-CM | POA: Diagnosis not present

## 2016-07-17 DIAGNOSIS — M9904 Segmental and somatic dysfunction of sacral region: Secondary | ICD-10-CM | POA: Diagnosis not present

## 2016-07-17 DIAGNOSIS — M9901 Segmental and somatic dysfunction of cervical region: Secondary | ICD-10-CM | POA: Diagnosis not present

## 2016-07-23 DIAGNOSIS — H34812 Central retinal vein occlusion, left eye, with macular edema: Secondary | ICD-10-CM | POA: Diagnosis not present

## 2016-07-31 DIAGNOSIS — M5137 Other intervertebral disc degeneration, lumbosacral region: Secondary | ICD-10-CM | POA: Diagnosis not present

## 2016-07-31 DIAGNOSIS — M9905 Segmental and somatic dysfunction of pelvic region: Secondary | ICD-10-CM | POA: Diagnosis not present

## 2016-07-31 DIAGNOSIS — M9903 Segmental and somatic dysfunction of lumbar region: Secondary | ICD-10-CM | POA: Diagnosis not present

## 2016-07-31 DIAGNOSIS — M9901 Segmental and somatic dysfunction of cervical region: Secondary | ICD-10-CM | POA: Diagnosis not present

## 2016-07-31 DIAGNOSIS — M9904 Segmental and somatic dysfunction of sacral region: Secondary | ICD-10-CM | POA: Diagnosis not present

## 2016-07-31 DIAGNOSIS — M25562 Pain in left knee: Secondary | ICD-10-CM | POA: Diagnosis not present

## 2016-07-31 DIAGNOSIS — M9902 Segmental and somatic dysfunction of thoracic region: Secondary | ICD-10-CM | POA: Diagnosis not present

## 2016-08-04 ENCOUNTER — Other Ambulatory Visit: Payer: Self-pay | Admitting: Family Medicine

## 2016-08-06 ENCOUNTER — Telehealth: Payer: Self-pay | Admitting: Family Medicine

## 2016-08-06 NOTE — Telephone Encounter (Signed)
Called pt back.

## 2016-08-06 NOTE — Telephone Encounter (Signed)
Scheduled

## 2016-08-12 ENCOUNTER — Other Ambulatory Visit: Payer: Self-pay | Admitting: Family Medicine

## 2016-08-12 DIAGNOSIS — M9902 Segmental and somatic dysfunction of thoracic region: Secondary | ICD-10-CM | POA: Diagnosis not present

## 2016-08-12 DIAGNOSIS — M9903 Segmental and somatic dysfunction of lumbar region: Secondary | ICD-10-CM | POA: Diagnosis not present

## 2016-08-12 DIAGNOSIS — M9901 Segmental and somatic dysfunction of cervical region: Secondary | ICD-10-CM | POA: Diagnosis not present

## 2016-08-12 DIAGNOSIS — M5137 Other intervertebral disc degeneration, lumbosacral region: Secondary | ICD-10-CM | POA: Diagnosis not present

## 2016-08-12 DIAGNOSIS — M9905 Segmental and somatic dysfunction of pelvic region: Secondary | ICD-10-CM | POA: Diagnosis not present

## 2016-08-12 DIAGNOSIS — M9904 Segmental and somatic dysfunction of sacral region: Secondary | ICD-10-CM | POA: Diagnosis not present

## 2016-08-12 DIAGNOSIS — M25562 Pain in left knee: Secondary | ICD-10-CM | POA: Diagnosis not present

## 2016-08-19 ENCOUNTER — Other Ambulatory Visit (INDEPENDENT_AMBULATORY_CARE_PROVIDER_SITE_OTHER): Payer: Medicare Other

## 2016-08-19 DIAGNOSIS — E119 Type 2 diabetes mellitus without complications: Secondary | ICD-10-CM | POA: Diagnosis not present

## 2016-08-19 DIAGNOSIS — I10 Essential (primary) hypertension: Secondary | ICD-10-CM | POA: Diagnosis not present

## 2016-08-19 DIAGNOSIS — E7800 Pure hypercholesterolemia, unspecified: Secondary | ICD-10-CM | POA: Diagnosis not present

## 2016-08-19 DIAGNOSIS — E559 Vitamin D deficiency, unspecified: Secondary | ICD-10-CM | POA: Diagnosis not present

## 2016-08-19 DIAGNOSIS — I4819 Other persistent atrial fibrillation: Secondary | ICD-10-CM

## 2016-08-19 DIAGNOSIS — I481 Persistent atrial fibrillation: Secondary | ICD-10-CM | POA: Diagnosis not present

## 2016-08-19 LAB — BAYER DCA HB A1C WAIVED: HB A1C: 5.9 % (ref <7.0)

## 2016-08-20 LAB — CBC WITH DIFFERENTIAL/PLATELET
BASOS ABS: 0.1 10*3/uL (ref 0.0–0.2)
Basos: 1 %
EOS (ABSOLUTE): 0.1 10*3/uL (ref 0.0–0.4)
EOS: 2 %
Hematocrit: 43.5 % (ref 37.5–51.0)
Hemoglobin: 14.4 g/dL (ref 13.0–17.7)
Immature Grans (Abs): 0 10*3/uL (ref 0.0–0.1)
Immature Granulocytes: 0 %
Lymphocytes Absolute: 2.2 10*3/uL (ref 0.7–3.1)
Lymphs: 26 %
MCH: 29.5 pg (ref 26.6–33.0)
MCHC: 33.1 g/dL (ref 31.5–35.7)
MCV: 89 fL (ref 79–97)
MONOCYTES: 7 %
Monocytes Absolute: 0.6 10*3/uL (ref 0.1–0.9)
NEUTROS PCT: 64 %
Neutrophils Absolute: 5.5 10*3/uL (ref 1.4–7.0)
PLATELETS: 177 10*3/uL (ref 150–379)
RBC: 4.88 x10E6/uL (ref 4.14–5.80)
RDW: 14.5 % (ref 12.3–15.4)
WBC: 8.5 10*3/uL (ref 3.4–10.8)

## 2016-08-20 LAB — BMP8+EGFR
BUN/Creatinine Ratio: 15 (ref 10–24)
BUN: 13 mg/dL (ref 8–27)
CO2: 27 mmol/L (ref 18–29)
CREATININE: 0.87 mg/dL (ref 0.76–1.27)
Calcium: 9.1 mg/dL (ref 8.6–10.2)
Chloride: 101 mmol/L (ref 96–106)
GFR calc Af Amer: 90 mL/min/{1.73_m2} (ref >59)
GFR calc non Af Amer: 78 mL/min/{1.73_m2} (ref >59)
GLUCOSE: 119 mg/dL — AB (ref 65–99)
Potassium: 4.1 mmol/L (ref 3.5–5.2)
Sodium: 144 mmol/L (ref 134–144)

## 2016-08-20 LAB — HEPATIC FUNCTION PANEL
ALT: 17 IU/L (ref 0–44)
AST: 26 IU/L (ref 0–40)
Albumin: 3.7 g/dL (ref 3.5–4.7)
Alkaline Phosphatase: 113 IU/L (ref 39–117)
BILIRUBIN, DIRECT: 0.19 mg/dL (ref 0.00–0.40)
Bilirubin Total: 0.5 mg/dL (ref 0.0–1.2)
TOTAL PROTEIN: 6.7 g/dL (ref 6.0–8.5)

## 2016-08-20 LAB — LIPID PANEL
Chol/HDL Ratio: 2.7 ratio units (ref 0.0–5.0)
Cholesterol, Total: 105 mg/dL (ref 100–199)
HDL: 39 mg/dL — AB (ref >39)
LDL Calculated: 52 mg/dL (ref 0–99)
TRIGLYCERIDES: 69 mg/dL (ref 0–149)
VLDL CHOLESTEROL CAL: 14 mg/dL (ref 5–40)

## 2016-08-20 LAB — VITAMIN D 25 HYDROXY (VIT D DEFICIENCY, FRACTURES): VIT D 25 HYDROXY: 62.5 ng/mL (ref 30.0–100.0)

## 2016-08-26 DIAGNOSIS — M25562 Pain in left knee: Secondary | ICD-10-CM | POA: Diagnosis not present

## 2016-08-26 DIAGNOSIS — M9903 Segmental and somatic dysfunction of lumbar region: Secondary | ICD-10-CM | POA: Diagnosis not present

## 2016-08-26 DIAGNOSIS — M5137 Other intervertebral disc degeneration, lumbosacral region: Secondary | ICD-10-CM | POA: Diagnosis not present

## 2016-08-26 DIAGNOSIS — M9902 Segmental and somatic dysfunction of thoracic region: Secondary | ICD-10-CM | POA: Diagnosis not present

## 2016-08-26 DIAGNOSIS — M9905 Segmental and somatic dysfunction of pelvic region: Secondary | ICD-10-CM | POA: Diagnosis not present

## 2016-08-26 DIAGNOSIS — M9904 Segmental and somatic dysfunction of sacral region: Secondary | ICD-10-CM | POA: Diagnosis not present

## 2016-08-26 DIAGNOSIS — M9901 Segmental and somatic dysfunction of cervical region: Secondary | ICD-10-CM | POA: Diagnosis not present

## 2016-08-27 DIAGNOSIS — H35352 Cystoid macular degeneration, left eye: Secondary | ICD-10-CM | POA: Diagnosis not present

## 2016-08-27 DIAGNOSIS — H4052X3 Glaucoma secondary to other eye disorders, left eye, severe stage: Secondary | ICD-10-CM | POA: Diagnosis not present

## 2016-08-27 DIAGNOSIS — H34812 Central retinal vein occlusion, left eye, with macular edema: Secondary | ICD-10-CM | POA: Diagnosis not present

## 2016-08-27 DIAGNOSIS — H353131 Nonexudative age-related macular degeneration, bilateral, early dry stage: Secondary | ICD-10-CM | POA: Diagnosis not present

## 2016-08-27 DIAGNOSIS — H401123 Primary open-angle glaucoma, left eye, severe stage: Secondary | ICD-10-CM | POA: Diagnosis not present

## 2016-08-28 ENCOUNTER — Encounter: Payer: Self-pay | Admitting: Family Medicine

## 2016-08-28 ENCOUNTER — Ambulatory Visit (INDEPENDENT_AMBULATORY_CARE_PROVIDER_SITE_OTHER): Payer: Medicare Other | Admitting: Family Medicine

## 2016-08-28 VITALS — BP 115/69 | HR 69 | Temp 97.0°F | Ht 71.0 in | Wt 202.0 lb

## 2016-08-28 DIAGNOSIS — I4819 Other persistent atrial fibrillation: Secondary | ICD-10-CM

## 2016-08-28 DIAGNOSIS — I10 Essential (primary) hypertension: Secondary | ICD-10-CM | POA: Diagnosis not present

## 2016-08-28 DIAGNOSIS — N400 Enlarged prostate without lower urinary tract symptoms: Secondary | ICD-10-CM | POA: Diagnosis not present

## 2016-08-28 DIAGNOSIS — E559 Vitamin D deficiency, unspecified: Secondary | ICD-10-CM | POA: Diagnosis not present

## 2016-08-28 DIAGNOSIS — E119 Type 2 diabetes mellitus without complications: Secondary | ICD-10-CM

## 2016-08-28 DIAGNOSIS — E7800 Pure hypercholesterolemia, unspecified: Secondary | ICD-10-CM | POA: Diagnosis not present

## 2016-08-28 DIAGNOSIS — R413 Other amnesia: Secondary | ICD-10-CM | POA: Diagnosis not present

## 2016-08-28 DIAGNOSIS — I481 Persistent atrial fibrillation: Secondary | ICD-10-CM

## 2016-08-28 NOTE — Progress Notes (Signed)
Subjective:    Patient ID: Mark Wilkinson, male    DOB: [birth year], 83 y.o.   MRN: [id number]  HPI Pt here for follow up and management of chronic medical problems which includes diabetes, hyperlipidemia and hypertension. He is taking medication regularly.The patient's wife comes to the visit with him. He is concerned because he is continued to lose vision in his  Eye. He sees the ophthalmologist regularly about this. His lab work is been done and we will review this with him during the visit today. He is due to return an FOBT and he will have a rectal exam today. The LDL C cholesterol was excellent on his lab work. Triglycerides are good at 69. The blood sugar was elevated at 119. The creatinine was normal and so were all of the electrolytes. The CBC has a normal white blood cell count with an excellent hemoglobin at 14.4 and adequate platelets. All liver function tests were normal. The vitamin D level was good at 62.5 and he will continue with current treatment patient's hemoglobin A1c was stable at 5.9%. Blood pressures are brought in from the outside and these are all excellent with systolic readings ranging from 878-676 and diastolic readings ranging from 60-91. The patient denies any chest pain or shortness of breath. He denies any trouble with his stomach including nausea vomiting or diarrhea. He does have some constipation. He also complains continually with both knees which have been replaced. He also goes to the bathroom a lot and has a history of BPH and does take a fluid pill. He will continue to follow-up with the ophthalmologist regarding his left eye problems.    Patient Active Problem List   Diagnosis Date Noted  . Osteopenia of the elderly 09/20/2015  . Memory impairment 08/04/2015  . Type 2 diabetes mellitus ([place]) 06/21/2015  . Chronic diastolic CHF (congestive heart failure) ([place]) 01/24/2015  . Atrial fibrillation with RVR ([place]) 01/23/2015  . Shortness of breath 01/23/2015  .  Physical deconditioning 01/23/2015  . Dementia 01/23/2015  . Essential hypertension 01/23/2015  . Diastolic CHF ([place]) 72/01/4708  . S/P left TKA 01/16/2015  . Atrial fibrillation, persistent ([place]) 02/25/2014  . Metabolic syndrome 62/83/6629  . BPH (benign prostatic hyperplasia) 06/16/2013  . Chest pain 03/18/2013  . Acute encephalopathy 12/11/2012  . Hypotension, unspecified 12/11/2012  . Elevated troponin 12/11/2012  . Expected blood loss anemia 12/09/2012  . Obese 12/09/2012  . S/P right TKA 12/08/2012  . Preop cardiovascular exam 11/25/2012  . Essential hypertension, benign   . Other and unspecified hyperlipidemia   . Diaphragmatic hernia without mention of obstruction or gangrene   . Gout, unspecified   . Calculus of kidney   . [place], [place]   . Asymptomatic varicose veins   . Lumbago    Outpatient Encounter Prescriptions as of 08/28/2016  Medication Sig  . acetaminophen (TYLENOL) 500 MG tablet Take 1 tablet (500 mg total) by mouth every 6 (six) hours as needed.  Marland Kitchen allopurinol (ZYLOPRIM) 300 MG tablet TAKE 1 TABLET DAILY  . atenolol (TENORMIN) 25 MG tablet TAKE 1 TABLET BY MOUTH EVERY DAY  . atorvastatin (LIPITOR) 40 MG tablet TAKE 1 TABLET BY MOUTH EVERY DAY  . B Complex-C (SUPER B COMPLEX PO) Take 1 tablet by mouth at bedtime.   . brimonidine (ALPHAGAN) 0.2 % ophthalmic solution   . Cholecalciferol (VITAMIN D3) 1000 UNITS CAPS Take 2,000 Units by mouth every morning.   . diltiazem (CARDIZEM CD) 120 MG 24 hr capsule  Take 1 capsule (120 mg total) by mouth every morning.  . docusate sodium (COLACE) 100 MG capsule Take 1 capsule (100 mg total) by mouth 2 (two) times daily. (Patient taking differently: Take 100 mg by mouth daily as needed. )  . esomeprazole (NEXIUM) 40 MG capsule TAKE 1 CAPSULE DAILY  . ferrous sulfate 325 (65 FE) MG tablet Take 1 tablet (325 mg total) by mouth daily with breakfast.  . furosemide (LASIX) 20 MG tablet TAKE 1 TABLET (20 MG TOTAL) BY  MOUTH DAILY.  Marland Kitchen glucose blood test strip Use to check BG once daily.  Please dispsense One Touch Verio Strips.  Dx:  Debbora Dus.9  . latanoprost (XALATAN) 0.005 % ophthalmic solution USE 1 DROP IN LEFT EYE NIGHTLY  . Multiple Vitamin (MULTIVITAMIN WITH MINERALS) TABS Take 1 tablet by mouth every morning.   . Omega-3 Fatty Acids (FISH OIL) 1000 MG CAPS Take 1,000 mg by mouth 2 (two) times daily.   [name] DELICA LANCETS 56D MISC Use to check BG once daily. Dx:  E11.9.  . Ranibizumab (LUCENTIS) 0.3 MG/0.05ML SOLN Place 1 application into the left eye. Injection q 6-8 weeks  . RAPAFLO 8 MG CAPS capsule TAKE 1 CAPSULE EVERY DAY WITH FOOD  . timolol (TIMOPTIC) 0.5 % ophthalmic solution 1 DROP IN LEFT EYE TWICE A DAY  . XARELTO 20 MG TABS tablet TAKE 1 TABLET BY MOUTH EVERY DAY WITH SUPPER  . [DISCONTINUED] allopurinol (ZYLOPRIM) 300 MG tablet Take 300 mg by mouth daily.  . [DISCONTINUED] atenolol (TENORMIN) 25 MG tablet Take 1 tablet (25 mg total) by mouth every evening.  . [DISCONTINUED] silodosin (RAPAFLO) 8 MG CAPS capsule Take 8 mg by mouth daily with breakfast.   No facility-administered encounter medications on file as of 08/28/2016.       Review of Systems  Constitutional: Negative.   HENT: Negative.   Eyes: Negative.        Recent trouble with eye - vision changes - seeing opth. for this  Respiratory: Negative.   Cardiovascular: Negative.   Gastrointestinal: Negative.   Endocrine: Negative.   Genitourinary: Negative.   Musculoskeletal: Negative.   Skin: Negative.   Allergic/Immunologic: Negative.   Neurological: Negative.   Hematological: Negative.   Psychiatric/Behavioral: Negative.        Objective:   Physical Exam  Constitutional: He is oriented to person, place, and time. He appears well-developed and well-nourished. No distress.  The patient is pleasant and calm and fairly alert though with some memory deficits.  HENT:  Head: Normocephalic and atraumatic.  Nose: Nose  normal.  Mouth/Throat: Oropharynx is clear and moist. No oropharyngeal exudate.  Bilateral ears cerumen right greater than left  Eyes: Conjunctivae and EOM are normal. Pupils are equal, round, and reactive to light. Right eye exhibits no discharge. Left eye exhibits no discharge. No scleral icterus.  The patient will continue to follow-up with ophthalmology for his declining vision in the left eye.  Neck: Normal range of motion. Neck supple. No thyromegaly present.  No bruits thyromegaly or anterior cervical adenopathy  Cardiovascular: Normal rate, regular rhythm, normal heart sounds and intact distal pulses.  Exam reveals no friction rub.   No murmur heard. The heart is regular today at 72/m  Pulmonary/Chest: Effort normal and breath sounds normal. No respiratory distress. He has no wheezes. He has no rales. He exhibits no tenderness.  Clear anteriorly and posteriorly and no axillary adenopathy  Abdominal: Soft. Bowel sounds are normal. He exhibits no mass. There is  no tenderness. There is no rebound and no guarding.  Nontender without masses or organ enlargement or bruits  Genitourinary: Rectum normal and penis normal.  Genitourinary Comments: The prostate gland is very enlarged and somewhat firm. There were no rectal masses palpable. The external genitalia appeared to be within normal limits.  Musculoskeletal: He exhibits no edema.  Patient is somewhat kyphotic and uses a cane for ambulation.  Lymphadenopathy:    He has no cervical adenopathy.  Neurological: He is alert and oriented to person, place, and time. He has normal reflexes. No cranial nerve deficit.  Skin: Skin is warm and dry. No rash noted.  Psychiatric: He has a normal mood and affect. His behavior is normal. Judgment and thought content normal.  The patient does have some memory deficits.  Nursing note and vitals reviewed.  BP 115/69 (BP Location: Left Arm, Patient Position: Sitting)   Pulse 69   Temp 97 F (36.1 C)  (Oral)   Ht 5\' 11"  (1.803 m)   Wt 202 lb (91.6 kg)   BMI 28.17 kg/m         Assessment & Plan:  1. Type 2 diabetes mellitus without complication, without long-term current use of insulin (HCC) -The hemoglobin A1c was good and he will continue with current treatment - Microalbumin / creatinine urine ratio  2. Essential hypertension, benign -The blood pressure was good as well as the readings from home which were scanned into the record and he will continue with current treatment  3. Atrial fibrillation, persistent ([place]) -The heart actually had a regular rate and rhythm today at 72/m.  4. Vitamin D deficiency -The vitamin D level was good and he will continue with current treatment  5. Pure hypercholesterolemia -All cholesterol numbers were excellent and he will continue with current treatment  6. Memory impairment -Although he does have some memory deficits, everything seems to be stable and no changes will be made as of this visit.  7. Benign prostatic hyperplasia, unspecified whether lower urinary tract symptoms present -The patient continues to have frequency and does wear a depends. - Urinalysis, Complete  Patient Instructions                       Medicare Annual Wellness Visit  [facility] and the medical providers at [facility] strive to bring you the best medical care.  In doing so we not only want to address your current medical conditions and concerns but also to detect new conditions early and prevent illness, disease and health-related problems.    Medicare offers a yearly Wellness Visit which allows our clinical staff to assess your need for preventative services including immunizations, lifestyle education, counseling to decrease risk of preventable diseases and screening for fall risk and other medical concerns.    This visit is provided free of charge (no copay) for all Medicare recipients. The clinical pharmacists at [facility] have begun to conduct these Wellness Visits which will also include a thorough review of all your medications.    As you primary medical provider recommend that you make an appointment for your Annual Wellness Visit if you have not done so already this year.  You may set up this appointment before you leave today or you may call back ([phone]) and schedule an appointment.  Please make sure when you call that you mention that you are scheduling your Annual Wellness Visit with the clinical pharmacist so that the appointment may  be made for the proper length of time.     Continue current medications. Continue good therapeutic lifestyle changes which include good diet and exercise. Fall precautions discussed with patient. If an FOBT was given today- please return it to our front desk. If you are over 35 years old - you may need Prevnar 90 or the adult Pneumonia vaccine.  **Flu shots are available--- please call and schedule a FLU-CLINIC appointment**  After your visit with [place] today you will receive a survey in the mail or online from [employer] regarding your care with [place]. Please take a moment to fill this out. Your feedback is very important to [place] as you can help [place] better understand your patient needs as well as improve your experience and satisfaction. WE CARE ABOUT YOU!!!   The patient will continue to follow up with ophthalmology and as needed with orthopedics He should continue to use his cane and be careful and not put himself at risk for falling He should try elevating his feet for 20 minutes once or twice during the day with his feet being higher than the level of his heart He should continue to drink plenty of fluids and stay well hydrated    Arrie Senate MD

## 2016-08-28 NOTE — Patient Instructions (Addendum)
Medicare Annual Wellness Visit  [facility] and the medical providers at [facility] strive to bring you the best medical care.  In doing so we not only want to address your current medical conditions and concerns but also to detect new conditions early and prevent illness, disease and health-related problems.    Medicare offers a yearly Wellness Visit which allows our clinical staff to assess your need for preventative services including immunizations, lifestyle education, counseling to decrease risk of preventable diseases and screening for fall risk and other medical concerns.    This visit is provided free of charge (no copay) for all Medicare recipients. The clinical pharmacists at [facility] have begun to conduct these Wellness Visits which will also include a thorough review of all your medications.    As you primary medical provider recommend that you make an appointment for your Annual Wellness Visit if you have not done so already this year.  You may set up this appointment before you leave today or you may call back ([phone]) and schedule an appointment.  Please make sure when you call that you mention that you are scheduling your Annual Wellness Visit with the clinical pharmacist so that the appointment may be made for the proper length of time.     Continue current medications. Continue good therapeutic lifestyle changes which include good diet and exercise. Fall precautions discussed with patient. If an FOBT was given today- please return it to our front desk. If you are over 78 years old - you may need Prevnar 44 or the adult Pneumonia vaccine.  **Flu shots are available--- please call and schedule a FLU-CLINIC appointment**  After your visit with [place] today you will receive a survey in the mail or online from [employer] regarding your care with [place]. Please take a moment to fill this out. Your feedback is very  important to [place] as you can help [place] better understand your patient needs as well as improve your experience and satisfaction. WE CARE ABOUT YOU!!!   The patient will continue to follow up with ophthalmology and as needed with orthopedics He should continue to use his cane and be careful and not put himself at risk for falling He should try elevating his feet for 20 minutes once or twice during the day with his feet being higher than the level of his heart He should continue to drink plenty of fluids and stay well hydrated

## 2016-09-09 DIAGNOSIS — M9904 Segmental and somatic dysfunction of sacral region: Secondary | ICD-10-CM | POA: Diagnosis not present

## 2016-09-09 DIAGNOSIS — M9901 Segmental and somatic dysfunction of cervical region: Secondary | ICD-10-CM | POA: Diagnosis not present

## 2016-09-09 DIAGNOSIS — M25562 Pain in left knee: Secondary | ICD-10-CM | POA: Diagnosis not present

## 2016-09-09 DIAGNOSIS — M9903 Segmental and somatic dysfunction of lumbar region: Secondary | ICD-10-CM | POA: Diagnosis not present

## 2016-09-09 DIAGNOSIS — M5137 Other intervertebral disc degeneration, lumbosacral region: Secondary | ICD-10-CM | POA: Diagnosis not present

## 2016-09-09 DIAGNOSIS — M9902 Segmental and somatic dysfunction of thoracic region: Secondary | ICD-10-CM | POA: Diagnosis not present

## 2016-09-09 DIAGNOSIS — M9905 Segmental and somatic dysfunction of pelvic region: Secondary | ICD-10-CM | POA: Diagnosis not present

## 2016-09-10 ENCOUNTER — Other Ambulatory Visit: Payer: Self-pay | Admitting: Family Medicine

## 2016-09-10 ENCOUNTER — Other Ambulatory Visit: Payer: Medicare Other

## 2016-09-10 DIAGNOSIS — Z1211 Encounter for screening for malignant neoplasm of colon: Secondary | ICD-10-CM | POA: Diagnosis not present

## 2016-09-12 DIAGNOSIS — H348122 Central retinal vein occlusion, left eye, stable: Secondary | ICD-10-CM | POA: Diagnosis not present

## 2016-09-12 DIAGNOSIS — H2511 Age-related nuclear cataract, right eye: Secondary | ICD-10-CM | POA: Diagnosis not present

## 2016-09-12 DIAGNOSIS — H40012 Open angle with borderline findings, low risk, left eye: Secondary | ICD-10-CM | POA: Diagnosis not present

## 2016-09-12 DIAGNOSIS — Z961 Presence of intraocular lens: Secondary | ICD-10-CM | POA: Diagnosis not present

## 2016-09-12 DIAGNOSIS — H401112 Primary open-angle glaucoma, right eye, moderate stage: Secondary | ICD-10-CM | POA: Diagnosis not present

## 2016-09-12 LAB — FECAL OCCULT BLOOD, IMMUNOCHEMICAL: Fecal Occult Bld: NEGATIVE

## 2016-09-20 ENCOUNTER — Ambulatory Visit (INDEPENDENT_AMBULATORY_CARE_PROVIDER_SITE_OTHER): Payer: Medicare Other | Admitting: *Deleted

## 2016-09-20 VITALS — BP 116/73 | HR 68 | Temp 97.3°F | Ht 68.0 in | Wt 201.0 lb

## 2016-09-20 DIAGNOSIS — Z0000 Encounter for general adult medical examination without abnormal findings: Secondary | ICD-10-CM

## 2016-09-20 NOTE — Progress Notes (Signed)
Subjective:   Mark Wilkinson is a 83 y.o. male who presents for Medicare Annual/Subsequent preventive examination. He is accompanied today by his wife. He has trouble hearing and he has some dementia. 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 He does some walking each day and his diet is healthy-cooked meals from his wife. They attend Sunday school and [place] regularly. They have four children, but only one lives here in town. They are aware of fall hazards in the home and have already taken safety precautions. He states that his health is about the same as it was a year ago.       Objective:    Vitals: BP 116/73 (BP Location: Left Arm)   Pulse 68   Temp 97.3 F (36.3 C) (Oral)   Ht 5\' 8"  (1.727 m)   Wt 201 lb (91.2 kg)   BMI 30.56 kg/m   Body mass index is 30.56 kg/m.  Tobacco History  Smoking Status  . Former Smoker  . Packs/day: 0.12  . Years: 10.00  . Types: Cigarettes  . Quit date: 12/11/1960  Smokeless Tobacco  . Never Used     Counseling given: Not Answered former smoker - no tobacco products are used now  Past Medical History:  Diagnosis Date  . Arthritis    "knees, hips, back" (03/18/2013)  . Asymptomatic varicose veins   . Atrial fibrillation ([place])   . Calculus of kidney    stones  . Cataract   . Chronic lower back pain   . Dementia   . Dysrhythmia    a-fib  . Essential hypertension, benign   . GERD (gastroesophageal reflux disease)   . Gout, unspecified   . History of hiatal hernia   . Lumbago   . Other and unspecified hyperlipidemia   . PONV (postoperative nausea and vomiting)    Past Surgical History:  Procedure Laterality Date  . APPENDECTOMY    . CATARACT EXTRACTION W/ INTRAOCULAR LENS IMPLANT Left 2000's  . CHOLECYSTECTOMY    . CYSTOSCOPY W/ STONE MANIPULATION     "twice" (03/18/2013)  . CYSTOSCOPY WITH RETROGRADE PYELOGRAM, URETEROSCOPY AND STENT PLACEMENT Left 08/13/2012   Procedure: CYSTOSCOPY WITH RETROGRADE  PYELOGRAM, URETEROSCOPY AND STENT PLACEMENT;  Surgeon: Alexis Frock, MD;  Location: [facility];  Service: Urology;  Laterality: Left;  . EYE SURGERY    . TOE AMPUTATION Right ?1970's   "shot his toe off in hunting accident" (03/18/2013) - second toe  . TONSILLECTOMY AND ADENOIDECTOMY     "no tubes" (03/18/2013)  . TOTAL KNEE ARTHROPLASTY Right 12/08/2012   Procedure: RIGHT TOTAL KNEE ARTHROPLASTY;  Surgeon: [name], MD;  Location: [facility];  Service: Orthopedics;  Laterality: Right;  . TOTAL KNEE ARTHROPLASTY Left 01/16/2015   Procedure: TOTAL LEFT KNEE ARTHROPLASTY;  Surgeon: Paralee Cancel, MD;  Location: [facility];  Service: Orthopedics;  Laterality: Left;   Family History  Problem Relation Age of Onset  . Heart disease Mother     Died in her 23s  . Hypertension Mother   . Dementia Mother   . Stroke Father   . CAD Brother     MI 20s  . Heart disease Brother   . Stroke Brother   . Epilepsy Brother   . Dementia Sister   . Hypertension Sister   . Stroke Sister   . Hypertension Daughter   . Hypertension Son   . Hyperlipidemia Son   . Hypertension Daughter  History  Sexual Activity  . Sexual activity: No    Outpatient Encounter Prescriptions as of 09/20/2016  Medication Sig  . acetaminophen (TYLENOL) 500 MG tablet Take 1 tablet (500 mg total) by mouth every 6 (six) hours as needed.  Marland Kitchen allopurinol (ZYLOPRIM) 300 MG tablet TAKE 1 TABLET DAILY  . atenolol (TENORMIN) 25 MG tablet TAKE 1 TABLET BY MOUTH EVERY DAY  . atorvastatin (LIPITOR) 40 MG tablet TAKE 1 TABLET BY MOUTH EVERY DAY  . B Complex-C (SUPER B COMPLEX PO) Take 1 tablet by mouth at bedtime.   . brimonidine (ALPHAGAN) 0.2 % ophthalmic solution   . Cholecalciferol (VITAMIN D3) 1000 UNITS CAPS Take 2,000 Units by mouth every morning.   . diltiazem (CARDIZEM CD) 120 MG 24 hr capsule Take 1 capsule (120 mg total) by mouth every morning.  . docusate sodium (COLACE) 100 MG capsule Take 1 capsule (100 mg  total) by mouth 2 (two) times daily. (Patient taking differently: Take 100 mg by mouth daily as needed. )  . esomeprazole (NEXIUM) 40 MG capsule TAKE 1 CAPSULE DAILY  . furosemide (LASIX) 20 MG tablet TAKE 1 TABLET (20 MG TOTAL) BY MOUTH DAILY.  Marland Kitchen glucose blood test strip Use to check BG once daily.  Please dispsense One Touch Verio Strips.  Dx:  Mark Wilkinson9  . latanoprost (XALATAN) 0.005 % ophthalmic solution USE 1 DROP IN LEFT EYE NIGHTLY  . Multiple Vitamin (MULTIVITAMIN WITH MINERALS) TABS Take 1 tablet by mouth every morning.   . Omega-3 Fatty Acids (FISH OIL) 1000 MG CAPS Take 1,000 mg by mouth 2 (two) times daily.   [name] DELICA LANCETS 22W MISC Use to check BG once daily. Dx:  E11.9.  . Ranibizumab (LUCENTIS) 0.3 MG/0.05ML SOLN Place 1 application into the left eye. Injection q 6-8 weeks  . RAPAFLO 8 MG CAPS capsule TAKE 1 CAPSULE EVERY DAY WITH FOOD  . timolol (TIMOPTIC) 0.5 % ophthalmic solution 1 DROP IN LEFT EYE TWICE A DAY  . XARELTO 20 MG TABS tablet TAKE 1 TABLET BY MOUTH EVERY DAY WITH SUPPER  . [DISCONTINUED] ferrous sulfate 325 (65 FE) MG tablet Take 1 tablet (325 mg total) by mouth daily with breakfast.   No facility-administered encounter medications on file as of 09/20/2016.     Activities of Daily Living In your present state of health, do you have any difficulty performing the following activities: 09/20/2016  Hearing? Y  Vision? Y  Difficulty concentrating or making decisions? Y  Walking or climbing stairs? Y  Dressing or bathing? N  Doing errands, shopping? Y  Some recent data might be hidden  he has trouble hearing - but refuses to wear hearing aids. He wears glasses for reading He has trouble with back and knees  - stairs are hard, but does ok with cane walking. He dos not drive  Patient Care Team: Chipper Herb, MD as PCP - General (Family Medicine) Alexis Frock, MD (Urology) Paralee Cancel, MD (Orthopedic Surgery) Clent Jacks, MD (Ophthalmology) Hurman Horn, MD (Ophthalmology) Minus Breeding, MD as Consulting Physician (Cardiology)   Assessment:    Exercise Activities and Dietary recommendations    Goals    . Decrease the likelihood of falling    . Exercise 3x per week (30 min per time)          Will try to ride the stationary bike a few times per week - with the help of his wife.       Fall Risk Fall  Risk  09/20/2016 08/28/2016 04/16/2016 12/12/2015 09/20/2015  Falls in the past year? No No No Yes Yes  Number falls in past yr: - - - 1 1  Injury with Fall? - - - No No  Follow up - - - - Falls prevention discussed   Depression Screen PHQ 2/9 Scores 09/20/2016 08/28/2016 04/16/2016 12/12/2015  PHQ - 2 Score 0 0 0 0    Cognitive Function MMSE - Mini Mental State Exam 09/20/2016 09/20/2015  Not completed: - Unable to complete  Orientation to time 0 0  Orientation to Place 3 0  Registration 3 -  Attention/ Calculation 0 -  Recall 3 -  Language- name 2 objects 2 -  Language- repeat 1 -  Language- follow 3 step command 2 0  Language- read & follow direction 1 1  Write a sentence 1 -  Copy design 1 -  Total score 17 -    score today is 17/30.    Immunization History  Administered Date(s) Administered  . Influenza Whole 02/24/2010  . Influenza, High Dose Seasonal PF 03/17/2015, 02/19/2016  . Influenza,inj,Quad PF,36+ Mos 03/03/2013, 03/09/2014  . Pneumococcal Conjugate-13 03/24/2013  . Pneumococcal Polysaccharide-23 09/24/1996  . Td 08/25/2005  . Tdap 03/20/2011  . Zoster 08/26/2006   Screening Tests Health Maintenance  Topic Date Due  . INFLUENZA VACCINE  12/25/2016  . HEMOGLOBIN A1C  02/19/2017  . FOOT EXAM  08/28/2017  . URINE MICROALBUMIN  08/28/2017  . OPHTHALMOLOGY EXAM  09/13/2017  . DEXA SCAN  09/19/2017  . TETANUS/TDAP  02/24/2021  . PNA vac Low Risk Adult  Completed      Plan:    Pt will try and ride a stationary bike a few times a week. They will bring in a copy of their advanced directives. He  will keep follow up with DR Laurance Flatten and other specialist. We will plan to do a CXR at the next OV with DR Laurance Flatten  I have personally reviewed and noted the following in the patient's chart:   . Medical and social history . Use of alcohol, tobacco or illicit drugs  . Current medications and supplements . Functional ability and status . Nutritional status . Physical activity . Advanced directives . List of other physicians . Hospitalizations, surgeries, and ER visits in previous 12 months . Vitals . Screenings to include cognitive, depression, and falls . Referrals and appointments  In addition, I have reviewed and discussed with patient certain preventive protocols, quality metrics, and best practice recommendations. A written personalized care plan for preventive services as well as general preventive health recommendations were provided to patient.     Mark Wilkinson, Cameron Proud, LPN  4/96/7591  I have reviewed and agree with the above AWV documentation.   Mary-Margaret Hassell Done, FNP

## 2016-09-20 NOTE — Patient Instructions (Signed)
  [name] , Thank you for taking time to come for your Medicare Wellness Visit. I appreciate your ongoing commitment to your health goals. Please review the following plan we discussed and let me know if I can assist you in the future.   These are the goals we discussed: Goals    . Decrease the likelihood of falling    . Exercise 3x per week (30 min per time)          Will try to ride the stationary bike a few times per week - with the help of his wife.        This is a list of the screening recommended for you and due dates:  Health Maintenance  Topic Date Due  . Flu Shot  12/25/2016  . Hemoglobin A1C  02/19/2017  . Complete foot exam   08/28/2017  . Urine Protein Check  08/28/2017  . Eye exam for diabetics  09/13/2017  . DEXA scan (bone density measurement)  09/19/2017  . Tetanus Vaccine  02/24/2021  . Pneumonia vaccines  Completed   keep apponitments with Dr Laurance Flatten and other specialist Bring [place] a copy of advanced directives Check on the price of the [facility]

## 2016-09-22 ENCOUNTER — Other Ambulatory Visit: Payer: Self-pay | Admitting: Family Medicine

## 2016-09-23 DIAGNOSIS — M5137 Other intervertebral disc degeneration, lumbosacral region: Secondary | ICD-10-CM | POA: Diagnosis not present

## 2016-09-23 DIAGNOSIS — M25562 Pain in left knee: Secondary | ICD-10-CM | POA: Diagnosis not present

## 2016-09-23 DIAGNOSIS — M9901 Segmental and somatic dysfunction of cervical region: Secondary | ICD-10-CM | POA: Diagnosis not present

## 2016-09-23 DIAGNOSIS — M9904 Segmental and somatic dysfunction of sacral region: Secondary | ICD-10-CM | POA: Diagnosis not present

## 2016-09-23 DIAGNOSIS — M9902 Segmental and somatic dysfunction of thoracic region: Secondary | ICD-10-CM | POA: Diagnosis not present

## 2016-09-23 DIAGNOSIS — M9903 Segmental and somatic dysfunction of lumbar region: Secondary | ICD-10-CM | POA: Diagnosis not present

## 2016-09-23 DIAGNOSIS — M9905 Segmental and somatic dysfunction of pelvic region: Secondary | ICD-10-CM | POA: Diagnosis not present

## 2016-10-01 DIAGNOSIS — H4052X3 Glaucoma secondary to other eye disorders, left eye, severe stage: Secondary | ICD-10-CM | POA: Diagnosis not present

## 2016-10-01 DIAGNOSIS — H34812 Central retinal vein occlusion, left eye, with macular edema: Secondary | ICD-10-CM | POA: Diagnosis not present

## 2016-10-02 ENCOUNTER — Telehealth: Payer: Self-pay | Admitting: Cardiology

## 2016-10-02 DIAGNOSIS — H401112 Primary open-angle glaucoma, right eye, moderate stage: Secondary | ICD-10-CM | POA: Diagnosis not present

## 2016-10-02 DIAGNOSIS — H401123 Primary open-angle glaucoma, left eye, severe stage: Secondary | ICD-10-CM | POA: Diagnosis not present

## 2016-10-02 NOTE — Telephone Encounter (Signed)
New message       Request for surgical clearance:  1. What type of surgery is being performed?  Glaucoma surgery  When is this surgery scheduled?  Pending clearance Are there any medications that need to be held prior to surgery and how long?  Hold xarelto prior?  If yes, how many days prior? 2. Name of physician performing surgery?  Clent Jacks MD  What is your office phone and fax number? [phone] fax

## 2016-10-04 ENCOUNTER — Other Ambulatory Visit: Payer: Self-pay | Admitting: Family Medicine

## 2016-10-07 ENCOUNTER — Telehealth: Payer: Self-pay | Admitting: Cardiology

## 2016-10-07 DIAGNOSIS — L570 Actinic keratosis: Secondary | ICD-10-CM | POA: Diagnosis not present

## 2016-10-07 DIAGNOSIS — D225 Melanocytic nevi of trunk: Secondary | ICD-10-CM | POA: Diagnosis not present

## 2016-10-07 DIAGNOSIS — L821 Other seborrheic keratosis: Secondary | ICD-10-CM | POA: Diagnosis not present

## 2016-10-07 DIAGNOSIS — D1801 Hemangioma of skin and subcutaneous tissue: Secondary | ICD-10-CM | POA: Diagnosis not present

## 2016-10-07 NOTE — Telephone Encounter (Signed)
Sent via epic to [email or web address]

## 2016-10-07 NOTE — Telephone Encounter (Signed)
THIS WAS FAXED VIA EPIC TO [phone]

## 2016-10-07 NOTE — Telephone Encounter (Signed)
OK to hold Xarelto for 3 days for Glaucoma surgery

## 2016-10-07 NOTE — Telephone Encounter (Signed)
New message     Please fax over surgical clearance they do no have epic   Fax [phone]

## 2016-10-07 NOTE — Telephone Encounter (Signed)
Pt wife notified.

## 2016-10-08 DIAGNOSIS — M5137 Other intervertebral disc degeneration, lumbosacral region: Secondary | ICD-10-CM | POA: Diagnosis not present

## 2016-10-08 DIAGNOSIS — M9904 Segmental and somatic dysfunction of sacral region: Secondary | ICD-10-CM | POA: Diagnosis not present

## 2016-10-08 DIAGNOSIS — M9903 Segmental and somatic dysfunction of lumbar region: Secondary | ICD-10-CM | POA: Diagnosis not present

## 2016-10-08 DIAGNOSIS — M9905 Segmental and somatic dysfunction of pelvic region: Secondary | ICD-10-CM | POA: Diagnosis not present

## 2016-10-14 DIAGNOSIS — H401123 Primary open-angle glaucoma, left eye, severe stage: Secondary | ICD-10-CM | POA: Diagnosis not present

## 2016-10-22 DIAGNOSIS — M9905 Segmental and somatic dysfunction of pelvic region: Secondary | ICD-10-CM | POA: Diagnosis not present

## 2016-10-22 DIAGNOSIS — M9903 Segmental and somatic dysfunction of lumbar region: Secondary | ICD-10-CM | POA: Diagnosis not present

## 2016-10-22 DIAGNOSIS — M9904 Segmental and somatic dysfunction of sacral region: Secondary | ICD-10-CM | POA: Diagnosis not present

## 2016-10-22 DIAGNOSIS — M5137 Other intervertebral disc degeneration, lumbosacral region: Secondary | ICD-10-CM | POA: Diagnosis not present

## 2016-11-01 ENCOUNTER — Other Ambulatory Visit: Payer: Self-pay | Admitting: Family Medicine

## 2016-11-05 DIAGNOSIS — M9904 Segmental and somatic dysfunction of sacral region: Secondary | ICD-10-CM | POA: Diagnosis not present

## 2016-11-05 DIAGNOSIS — M9905 Segmental and somatic dysfunction of pelvic region: Secondary | ICD-10-CM | POA: Diagnosis not present

## 2016-11-05 DIAGNOSIS — M5137 Other intervertebral disc degeneration, lumbosacral region: Secondary | ICD-10-CM | POA: Diagnosis not present

## 2016-11-05 DIAGNOSIS — M9903 Segmental and somatic dysfunction of lumbar region: Secondary | ICD-10-CM | POA: Diagnosis not present

## 2016-11-10 ENCOUNTER — Other Ambulatory Visit: Payer: Self-pay | Admitting: Family Medicine

## 2016-11-11 ENCOUNTER — Other Ambulatory Visit: Payer: Self-pay | Admitting: Family Medicine

## 2016-11-13 DIAGNOSIS — H353131 Nonexudative age-related macular degeneration, bilateral, early dry stage: Secondary | ICD-10-CM | POA: Diagnosis not present

## 2016-11-13 DIAGNOSIS — H401123 Primary open-angle glaucoma, left eye, severe stage: Secondary | ICD-10-CM | POA: Diagnosis not present

## 2016-11-13 DIAGNOSIS — H4052X3 Glaucoma secondary to other eye disorders, left eye, severe stage: Secondary | ICD-10-CM | POA: Diagnosis not present

## 2016-11-13 DIAGNOSIS — H348122 Central retinal vein occlusion, left eye, stable: Secondary | ICD-10-CM | POA: Diagnosis not present

## 2016-11-13 DIAGNOSIS — H35352 Cystoid macular degeneration, left eye: Secondary | ICD-10-CM | POA: Diagnosis not present

## 2016-11-13 DIAGNOSIS — H35372 Puckering of macula, left eye: Secondary | ICD-10-CM | POA: Diagnosis not present

## 2016-11-19 DIAGNOSIS — M9905 Segmental and somatic dysfunction of pelvic region: Secondary | ICD-10-CM | POA: Diagnosis not present

## 2016-11-19 DIAGNOSIS — M5137 Other intervertebral disc degeneration, lumbosacral region: Secondary | ICD-10-CM | POA: Diagnosis not present

## 2016-11-19 DIAGNOSIS — M9904 Segmental and somatic dysfunction of sacral region: Secondary | ICD-10-CM | POA: Diagnosis not present

## 2016-11-19 DIAGNOSIS — M9903 Segmental and somatic dysfunction of lumbar region: Secondary | ICD-10-CM | POA: Diagnosis not present

## 2016-11-29 ENCOUNTER — Other Ambulatory Visit: Payer: Self-pay | Admitting: Family Medicine

## 2016-12-03 ENCOUNTER — Encounter: Payer: Self-pay | Admitting: *Deleted

## 2016-12-03 ENCOUNTER — Encounter: Payer: Self-pay | Admitting: Internal Medicine

## 2016-12-03 DIAGNOSIS — M9905 Segmental and somatic dysfunction of pelvic region: Secondary | ICD-10-CM | POA: Diagnosis not present

## 2016-12-03 DIAGNOSIS — M5137 Other intervertebral disc degeneration, lumbosacral region: Secondary | ICD-10-CM | POA: Diagnosis not present

## 2016-12-03 DIAGNOSIS — M9904 Segmental and somatic dysfunction of sacral region: Secondary | ICD-10-CM | POA: Diagnosis not present

## 2016-12-03 DIAGNOSIS — M9903 Segmental and somatic dysfunction of lumbar region: Secondary | ICD-10-CM | POA: Diagnosis not present

## 2016-12-06 ENCOUNTER — Other Ambulatory Visit: Payer: Self-pay | Admitting: Family Medicine

## 2016-12-17 DIAGNOSIS — M9904 Segmental and somatic dysfunction of sacral region: Secondary | ICD-10-CM | POA: Diagnosis not present

## 2016-12-17 DIAGNOSIS — M5137 Other intervertebral disc degeneration, lumbosacral region: Secondary | ICD-10-CM | POA: Diagnosis not present

## 2016-12-17 DIAGNOSIS — M9905 Segmental and somatic dysfunction of pelvic region: Secondary | ICD-10-CM | POA: Diagnosis not present

## 2016-12-17 DIAGNOSIS — M9903 Segmental and somatic dysfunction of lumbar region: Secondary | ICD-10-CM | POA: Diagnosis not present

## 2016-12-31 DIAGNOSIS — M5137 Other intervertebral disc degeneration, lumbosacral region: Secondary | ICD-10-CM | POA: Diagnosis not present

## 2016-12-31 DIAGNOSIS — M9905 Segmental and somatic dysfunction of pelvic region: Secondary | ICD-10-CM | POA: Diagnosis not present

## 2016-12-31 DIAGNOSIS — M9904 Segmental and somatic dysfunction of sacral region: Secondary | ICD-10-CM | POA: Diagnosis not present

## 2016-12-31 DIAGNOSIS — M9903 Segmental and somatic dysfunction of lumbar region: Secondary | ICD-10-CM | POA: Diagnosis not present

## 2017-01-06 ENCOUNTER — Ambulatory Visit: Payer: Medicare Other | Admitting: Family Medicine

## 2017-01-06 ENCOUNTER — Other Ambulatory Visit: Payer: Medicare Other

## 2017-01-06 DIAGNOSIS — R413 Other amnesia: Secondary | ICD-10-CM | POA: Diagnosis not present

## 2017-01-06 DIAGNOSIS — I10 Essential (primary) hypertension: Secondary | ICD-10-CM

## 2017-01-06 DIAGNOSIS — E7800 Pure hypercholesterolemia, unspecified: Secondary | ICD-10-CM

## 2017-01-06 DIAGNOSIS — I4819 Other persistent atrial fibrillation: Secondary | ICD-10-CM

## 2017-01-06 DIAGNOSIS — E119 Type 2 diabetes mellitus without complications: Secondary | ICD-10-CM | POA: Diagnosis not present

## 2017-01-06 DIAGNOSIS — E559 Vitamin D deficiency, unspecified: Secondary | ICD-10-CM | POA: Diagnosis not present

## 2017-01-06 DIAGNOSIS — I481 Persistent atrial fibrillation: Secondary | ICD-10-CM | POA: Diagnosis not present

## 2017-01-06 LAB — BAYER DCA HB A1C WAIVED: HB A1C (BAYER DCA - WAIVED): 5.9 % (ref <7.0)

## 2017-01-07 DIAGNOSIS — H4052X3 Glaucoma secondary to other eye disorders, left eye, severe stage: Secondary | ICD-10-CM | POA: Diagnosis not present

## 2017-01-07 DIAGNOSIS — H401123 Primary open-angle glaucoma, left eye, severe stage: Secondary | ICD-10-CM | POA: Diagnosis not present

## 2017-01-07 DIAGNOSIS — H35352 Cystoid macular degeneration, left eye: Secondary | ICD-10-CM | POA: Diagnosis not present

## 2017-01-07 DIAGNOSIS — H353131 Nonexudative age-related macular degeneration, bilateral, early dry stage: Secondary | ICD-10-CM | POA: Diagnosis not present

## 2017-01-07 DIAGNOSIS — H34812 Central retinal vein occlusion, left eye, with macular edema: Secondary | ICD-10-CM | POA: Diagnosis not present

## 2017-01-07 LAB — CBC WITH DIFFERENTIAL/PLATELET
BASOS: 0 %
Basophils Absolute: 0 10*3/uL (ref 0.0–0.2)
EOS (ABSOLUTE): 0.1 10*3/uL (ref 0.0–0.4)
Eos: 1 %
HEMOGLOBIN: 14.7 g/dL (ref 13.0–17.7)
Hematocrit: 42.2 % (ref 37.5–51.0)
Immature Grans (Abs): 0 10*3/uL (ref 0.0–0.1)
Immature Granulocytes: 0 %
LYMPHS ABS: 2.4 10*3/uL (ref 0.7–3.1)
LYMPHS: 29 %
MCH: 30.5 pg (ref 26.6–33.0)
MCHC: 34.8 g/dL (ref 31.5–35.7)
MCV: 88 fL (ref 79–97)
Monocytes Absolute: 0.5 10*3/uL (ref 0.1–0.9)
Monocytes: 6 %
NEUTROS ABS: 5.2 10*3/uL (ref 1.4–7.0)
Neutrophils: 64 %
Platelets: 165 10*3/uL (ref 150–379)
RBC: 4.82 x10E6/uL (ref 4.14–5.80)
RDW: 14.2 % (ref 12.3–15.4)
WBC: 8.2 10*3/uL (ref 3.4–10.8)

## 2017-01-07 LAB — VITAMIN D 25 HYDROXY (VIT D DEFICIENCY, FRACTURES): VIT D 25 HYDROXY: 63.2 ng/mL (ref 30.0–100.0)

## 2017-01-07 LAB — BMP8+EGFR
BUN / CREAT RATIO: 13 (ref 10–24)
BUN: 12 mg/dL (ref 8–27)
CALCIUM: 9.3 mg/dL (ref 8.6–10.2)
CO2: 27 mmol/L (ref 20–29)
Chloride: 102 mmol/L (ref 96–106)
Creatinine, Ser: 0.92 mg/dL (ref 0.76–1.27)
GFR, EST AFRICAN AMERICAN: 86 mL/min/{1.73_m2} (ref >59)
GFR, EST NON AFRICAN AMERICAN: 75 mL/min/{1.73_m2} (ref >59)
Glucose: 114 mg/dL — ABNORMAL HIGH (ref 65–99)
Potassium: 4.7 mmol/L (ref 3.5–5.2)
Sodium: 144 mmol/L (ref 134–144)

## 2017-01-07 LAB — LIPID PANEL
CHOLESTEROL TOTAL: 116 mg/dL (ref 100–199)
Chol/HDL Ratio: 2.8 ratio (ref 0.0–5.0)
HDL: 41 mg/dL (ref >39)
LDL CALC: 62 mg/dL (ref 0–99)
TRIGLYCERIDES: 66 mg/dL (ref 0–149)
VLDL Cholesterol Cal: 13 mg/dL (ref 5–40)

## 2017-01-07 LAB — HEPATIC FUNCTION PANEL
ALK PHOS: 102 IU/L (ref 39–117)
ALT: 15 IU/L (ref 0–44)
AST: 23 IU/L (ref 0–40)
Albumin: 3.7 g/dL (ref 3.5–4.7)
BILIRUBIN, DIRECT: 0.19 mg/dL (ref 0.00–0.40)
Bilirubin Total: 0.6 mg/dL (ref 0.0–1.2)
Total Protein: 6.8 g/dL (ref 6.0–8.5)

## 2017-01-08 ENCOUNTER — Other Ambulatory Visit: Payer: Self-pay | Admitting: Family Medicine

## 2017-01-08 NOTE — Telephone Encounter (Signed)
Next OV 01/15/17

## 2017-01-15 ENCOUNTER — Ambulatory Visit (INDEPENDENT_AMBULATORY_CARE_PROVIDER_SITE_OTHER): Payer: Medicare Other | Admitting: Family Medicine

## 2017-01-15 ENCOUNTER — Encounter: Payer: Self-pay | Admitting: Family Medicine

## 2017-01-15 ENCOUNTER — Ambulatory Visit (INDEPENDENT_AMBULATORY_CARE_PROVIDER_SITE_OTHER): Payer: Medicare Other

## 2017-01-15 VITALS — BP 94/62 | HR 72 | Temp 97.2°F | Ht 68.0 in | Wt 203.0 lb

## 2017-01-15 DIAGNOSIS — I481 Persistent atrial fibrillation: Secondary | ICD-10-CM | POA: Diagnosis not present

## 2017-01-15 DIAGNOSIS — M5137 Other intervertebral disc degeneration, lumbosacral region: Secondary | ICD-10-CM | POA: Diagnosis not present

## 2017-01-15 DIAGNOSIS — E7800 Pure hypercholesterolemia, unspecified: Secondary | ICD-10-CM

## 2017-01-15 DIAGNOSIS — I4819 Other persistent atrial fibrillation: Secondary | ICD-10-CM

## 2017-01-15 DIAGNOSIS — E559 Vitamin D deficiency, unspecified: Secondary | ICD-10-CM

## 2017-01-15 DIAGNOSIS — I5032 Chronic diastolic (congestive) heart failure: Secondary | ICD-10-CM

## 2017-01-15 DIAGNOSIS — R413 Other amnesia: Secondary | ICD-10-CM | POA: Diagnosis not present

## 2017-01-15 DIAGNOSIS — E119 Type 2 diabetes mellitus without complications: Secondary | ICD-10-CM | POA: Diagnosis not present

## 2017-01-15 DIAGNOSIS — I10 Essential (primary) hypertension: Secondary | ICD-10-CM

## 2017-01-15 DIAGNOSIS — M9903 Segmental and somatic dysfunction of lumbar region: Secondary | ICD-10-CM | POA: Diagnosis not present

## 2017-01-15 DIAGNOSIS — M9905 Segmental and somatic dysfunction of pelvic region: Secondary | ICD-10-CM | POA: Diagnosis not present

## 2017-01-15 DIAGNOSIS — M9904 Segmental and somatic dysfunction of sacral region: Secondary | ICD-10-CM | POA: Diagnosis not present

## 2017-01-15 NOTE — Progress Notes (Signed)
Subjective:    Patient ID: Mark Wilkinson, male    DOB: 02-16-1928, 83 y.o.   MRN: [id number]  HPI Pt here for follow up and management of chronic medical problems which includes diabetes, a fib, and hypertension. He is taking medication regularly.The patient comes with his wife to the visit today. He is been seeing the ophthalmologist, Dr. Carolynn Sayers and she says that his lost most of his vision in the left eye due to glaucoma. He is somewhat anxious and uptight when she is not nearby. His weight is stable and his vital signs are stable compared to the last visit. The patient is having problems with vision in his left eye and he actually admits that. He kept asking wall was in the room where was his wife. He is obviously developing more dementia but it is a good type of dementia. He and she denies any chest pain or shortness of breath with him anymore than usual. He is no trouble swallowing with heartburn indigestion nausea vomiting diarrhea or blood in the stool. 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 He had blood work done today and that was reviewed with him. All of his cholesterol numbers were excellent his blood sugar was slightly elevated at 114 but renal function and electrolytes were good. The CBC was normal and the vitamin D level was good. All liver function tests were normal. There were given a copy of this report to take home with him.     Patient Active Problem List   Diagnosis Date Noted  . Osteopenia of the elderly 09/20/2015  . Memory impairment 08/04/2015  . Type 2 diabetes mellitus ([place]) 06/21/2015  . Chronic diastolic CHF (congestive heart failure) ([place]) 01/24/2015  . Atrial fibrillation with RVR ([place]) 01/23/2015  . Shortness of breath 01/23/2015  . Physical deconditioning 01/23/2015  . Dementia 01/23/2015  . Essential hypertension 01/23/2015  . Diastolic CHF ([place]) 73/53/2992  . S/P left TKA 01/16/2015  . Atrial fibrillation, persistent ([place]) 02/25/2014  . Metabolic  syndrome 42/68/3419  . BPH (benign prostatic hyperplasia) 06/16/2013  . Chest pain 03/18/2013  . Acute encephalopathy 12/11/2012  . Hypotension, unspecified 12/11/2012  . Elevated troponin 12/11/2012  . Expected blood loss anemia 12/09/2012  . Obese 12/09/2012  . S/P right TKA 12/08/2012  . Preop cardiovascular exam 11/25/2012  . Essential hypertension, benign   . Other and unspecified hyperlipidemia   . Diaphragmatic hernia without mention of obstruction or gangrene   . Gout, unspecified   . Calculus of kidney   . [place], [place]   . Asymptomatic varicose veins   . Lumbago    Outpatient Encounter Prescriptions as of 01/15/2017  Medication Sig  . acetaminophen (TYLENOL) 500 MG tablet Take 1 tablet (500 mg total) by mouth every 6 (six) hours as needed.  Marland Kitchen allopurinol (ZYLOPRIM) 300 MG tablet TAKE 1 TABLET DAILY  . atenolol (TENORMIN) 25 MG tablet TAKE 1 TABLET BY MOUTH EVERY DAY  . atorvastatin (LIPITOR) 40 MG tablet TAKE 1 TABLET BY MOUTH EVERY DAY  . B Complex-C (SUPER B COMPLEX PO) Take 1 tablet by mouth at bedtime.   . brimonidine (ALPHAGAN) 0.2 % ophthalmic solution   . Cholecalciferol (VITAMIN D3) 1000 UNITS CAPS Take 2,000 Units by mouth every morning.   . diltiazem (CARDIZEM CD) 120 MG 24 hr capsule TAKE 1 CAPSULE (120 MG TOTAL) BY MOUTH DAILY.  Marland Kitchen docusate sodium (COLACE) 100 MG capsule Take 1 capsule (100 mg total) by mouth 2 (  two) times daily. (Patient taking differently: Take 100 mg by mouth daily as needed. )  . esomeprazole (NEXIUM) 40 MG capsule TAKE 1 CAPSULE DAILY  . furosemide (LASIX) 20 MG tablet TAKE 1 TABLET (20 MG TOTAL) BY MOUTH DAILY.  Marland Kitchen latanoprost (XALATAN) 0.005 % ophthalmic solution USE 1 DROP IN LEFT EYE NIGHTLY  . Multiple Vitamin (MULTIVITAMIN WITH MINERALS) TABS Take 1 tablet by mouth every morning.   . Omega-3 Fatty Acids (FISH OIL) 1000 MG CAPS Take 1,000 mg by mouth 2 (two) times daily.   [name] DELICA LANCETS 46T MISC USE TO CHECK BG  ONCE DAILY. DX: E11.9.  . ONETOUCH VERIO test strip USE TO CHECK BLOOD GLUCOSE ONCE DAILY E11.9  . Ranibizumab (LUCENTIS) 0.3 MG/0.05ML SOLN Place 1 application into the left eye. Injection q 6-8 weeks  . RAPAFLO 8 MG CAPS capsule TAKE 1 CAPSULE BY MOUTH EVERY DAY WITH FOOD  . timolol (TIMOPTIC) 0.5 % ophthalmic solution 1 DROP IN LEFT EYE TWICE A DAY  . XARELTO 20 MG TABS tablet TAKE 1 TABLET BY MOUTH EVERY DAY WITH SUPPER  . [DISCONTINUED] diltiazem (CARDIZEM CD) 120 MG 24 hr capsule Take 1 capsule (120 mg total) by mouth every morning.   No facility-administered encounter medications on file as of 01/15/2017.      Review of Systems  Constitutional: Negative.   HENT: Negative.   Eyes: Positive for visual disturbance (left eye).  Respiratory: Negative.   Cardiovascular: Negative.   Gastrointestinal: Negative.   Endocrine: Negative.   Genitourinary: Negative.   Musculoskeletal: Negative.   Skin: Negative.   Allergic/Immunologic: Negative.   Neurological: Negative.   Hematological: Negative.   Psychiatric/Behavioral: Negative.        Objective:   Physical Exam  Constitutional: He is oriented to person, place, and time. He appears well-developed and well-nourished. No distress.  He is very attached to having his wife close by. The patient is pleasant and keep questioning why he is here.  HENT:  Head: Normocephalic and atraumatic.  Right Ear: External ear normal.  Left Ear: External ear normal.  Nose: Nose normal.  Mouth/Throat: Oropharynx is clear and moist. No oropharyngeal exudate.  Eyes: Pupils are equal, round, and reactive to light. Conjunctivae and EOM are normal. Right eye exhibits no discharge. Left eye exhibits no discharge. No scleral icterus.  Neck: Normal range of motion. Neck supple. No thyromegaly present.  Cardiovascular: Normal rate and normal heart sounds.   No murmur heard. The heart is 72/m with an irregular irregular rhythm.  Pulmonary/Chest: Effort normal  and breath sounds normal. No respiratory distress. He has no wheezes. He has no rales. He exhibits no tenderness.  Clear anteriorly and posteriorly  Abdominal: Soft. Bowel sounds are normal. He exhibits no mass. There is no tenderness. There is no rebound and no guarding.  No abdominal tenderness masses or bruits or inguinal adenopathy  Genitourinary: Rectum normal.  Musculoskeletal: Normal range of motion. He exhibits no edema.  Lymphadenopathy:    He has no cervical adenopathy.  Neurological: He is alert and oriented to person, place, and time. He has normal reflexes. No cranial nerve deficit.  Skin: Skin is warm and dry. No rash noted.  Psychiatric: He has a normal mood and affect. His behavior is normal. Thought content normal.  The patient obviously has some  dementia and memory impairment  Nursing note and vitals reviewed.  BP 94/62 (BP Location: Right Arm)   Pulse 72   Temp (!) 97.2 F (36.2 C) (  Oral)   Ht 5\' 8"  (1.727 m)   Wt 203 lb (92.1 kg)   BMI 30.87 kg/m         Assessment & Plan:  1. Type 2 diabetes mellitus without complication, without long-term current use of insulin (HCC) -The hemoglobin A1c was excellent for the patient he will continue with therapeutic lifestyle changes.  2. Essential hypertension, benign -Blood pressure is at the low end of the normal range for this patient but this is been his typical blood pressure for years and no change in treatment - DG Chest 2 View; Future  3. Vitamin D deficiency -Vitamin D level was good and he will continue with current treatment  4. Atrial fibrillation, persistent ([place]) -The patient was in atrial fibrillation at about 72/m and tolerating this well and will follow-up with the cardiologist as planned - DG Chest 2 View; Future  5. Pure hypercholesterolemia -All cholesterol numbers were excellent and he will continue with current treatment - DG Chest 2 View; Future  6. Memory impairment -The patient's wife  indicates he is becoming more dependent upon her. He is not and agitated dementia. We will continue to monitor his progress at future visits.  7. Chronic diastolic congestive heart failure ([place]) -Follow-up with cardiology as planned and continue with current treatment blood thinner and medication for rate control  Patient Instructions                       Medicare Annual Wellness Visit  [facility] and the medical providers at [facility] strive to bring you the best medical care.  In doing so we not only want to address your current medical conditions and concerns but also to detect new conditions early and prevent illness, disease and health-related problems.    Medicare offers a yearly Wellness Visit which allows our clinical staff to assess your need for preventative services including immunizations, lifestyle education, counseling to decrease risk of preventable diseases and screening for fall risk and other medical concerns.    This visit is provided free of charge (no copay) for all Medicare recipients. The clinical pharmacists at [facility] have begun to conduct these Wellness Visits which will also include a thorough review of all your medications.    As you primary medical provider recommend that you make an appointment for your Annual Wellness Visit if you have not done so already this year.  You may set up this appointment before you leave today or you may call back ([phone]) and schedule an appointment.  Please make sure when you call that you mention that you are scheduling your Annual Wellness Visit with the clinical pharmacist so that the appointment may be made for the proper length of time.    Follow-up with cardiology as planned Follow-up with ophthalmology as planned Continue to monitor blood pressures and blood sugars periodically at home Always drink plenty of fluids and stay well hydrated   Arrie Senate MD

## 2017-01-15 NOTE — Patient Instructions (Addendum)
Medicare Annual Wellness Visit  [facility] and the medical providers at [facility] strive to bring you the best medical care.  In doing so we not only want to address your current medical conditions and concerns but also to detect new conditions early and prevent illness, disease and health-related problems.    Medicare offers a yearly Wellness Visit which allows our clinical staff to assess your need for preventative services including immunizations, lifestyle education, counseling to decrease risk of preventable diseases and screening for fall risk and other medical concerns.    This visit is provided free of charge (no copay) for all Medicare recipients. The clinical pharmacists at [facility] have begun to conduct these Wellness Visits which will also include a thorough review of all your medications.    As you primary medical provider recommend that you make an appointment for your Annual Wellness Visit if you have not done so already this year.  You may set up this appointment before you leave today or you may call back ([phone]) and schedule an appointment.  Please make sure when you call that you mention that you are scheduling your Annual Wellness Visit with the clinical pharmacist so that the appointment may be made for the proper length of time.    Follow-up with cardiology as planned Follow-up with ophthalmology as planned Continue to monitor blood pressures and blood sugars periodically at home Always drink plenty of fluids and stay well hydrated

## 2017-01-21 DIAGNOSIS — M79676 Pain in unspecified toe(s): Secondary | ICD-10-CM | POA: Diagnosis not present

## 2017-01-21 DIAGNOSIS — B351 Tinea unguium: Secondary | ICD-10-CM | POA: Diagnosis not present

## 2017-01-29 DIAGNOSIS — M9905 Segmental and somatic dysfunction of pelvic region: Secondary | ICD-10-CM | POA: Diagnosis not present

## 2017-01-29 DIAGNOSIS — M5137 Other intervertebral disc degeneration, lumbosacral region: Secondary | ICD-10-CM | POA: Diagnosis not present

## 2017-01-29 DIAGNOSIS — M9903 Segmental and somatic dysfunction of lumbar region: Secondary | ICD-10-CM | POA: Diagnosis not present

## 2017-01-29 DIAGNOSIS — M9904 Segmental and somatic dysfunction of sacral region: Secondary | ICD-10-CM | POA: Diagnosis not present

## 2017-02-01 ENCOUNTER — Other Ambulatory Visit: Payer: Self-pay | Admitting: Family Medicine

## 2017-02-07 ENCOUNTER — Other Ambulatory Visit: Payer: Self-pay | Admitting: Family Medicine

## 2017-02-11 DIAGNOSIS — M9903 Segmental and somatic dysfunction of lumbar region: Secondary | ICD-10-CM | POA: Diagnosis not present

## 2017-02-11 DIAGNOSIS — M5137 Other intervertebral disc degeneration, lumbosacral region: Secondary | ICD-10-CM | POA: Diagnosis not present

## 2017-02-11 DIAGNOSIS — M9905 Segmental and somatic dysfunction of pelvic region: Secondary | ICD-10-CM | POA: Diagnosis not present

## 2017-02-11 DIAGNOSIS — M9904 Segmental and somatic dysfunction of sacral region: Secondary | ICD-10-CM | POA: Diagnosis not present

## 2017-02-12 ENCOUNTER — Other Ambulatory Visit: Payer: Self-pay | Admitting: Cardiology

## 2017-02-12 DIAGNOSIS — H4052X3 Glaucoma secondary to other eye disorders, left eye, severe stage: Secondary | ICD-10-CM | POA: Diagnosis not present

## 2017-02-12 DIAGNOSIS — H34812 Central retinal vein occlusion, left eye, with macular edema: Secondary | ICD-10-CM | POA: Diagnosis not present

## 2017-02-12 DIAGNOSIS — H401123 Primary open-angle glaucoma, left eye, severe stage: Secondary | ICD-10-CM | POA: Diagnosis not present

## 2017-02-12 DIAGNOSIS — H353131 Nonexudative age-related macular degeneration, bilateral, early dry stage: Secondary | ICD-10-CM | POA: Diagnosis not present

## 2017-02-12 DIAGNOSIS — H35352 Cystoid macular degeneration, left eye: Secondary | ICD-10-CM | POA: Diagnosis not present

## 2017-02-19 DIAGNOSIS — C4442 Squamous cell carcinoma of skin of scalp and neck: Secondary | ICD-10-CM | POA: Diagnosis not present

## 2017-02-19 DIAGNOSIS — L570 Actinic keratosis: Secondary | ICD-10-CM | POA: Diagnosis not present

## 2017-02-19 DIAGNOSIS — D485 Neoplasm of uncertain behavior of skin: Secondary | ICD-10-CM | POA: Diagnosis not present

## 2017-02-19 DIAGNOSIS — B354 Tinea corporis: Secondary | ICD-10-CM | POA: Diagnosis not present

## 2017-02-26 DIAGNOSIS — M9904 Segmental and somatic dysfunction of sacral region: Secondary | ICD-10-CM | POA: Diagnosis not present

## 2017-02-26 DIAGNOSIS — M5137 Other intervertebral disc degeneration, lumbosacral region: Secondary | ICD-10-CM | POA: Diagnosis not present

## 2017-02-26 DIAGNOSIS — M9903 Segmental and somatic dysfunction of lumbar region: Secondary | ICD-10-CM | POA: Diagnosis not present

## 2017-02-26 DIAGNOSIS — M9905 Segmental and somatic dysfunction of pelvic region: Secondary | ICD-10-CM | POA: Diagnosis not present

## 2017-02-27 ENCOUNTER — Ambulatory Visit (INDEPENDENT_AMBULATORY_CARE_PROVIDER_SITE_OTHER): Payer: Medicare Other

## 2017-02-27 DIAGNOSIS — Z23 Encounter for immunization: Secondary | ICD-10-CM

## 2017-03-05 DIAGNOSIS — C4442 Squamous cell carcinoma of skin of scalp and neck: Secondary | ICD-10-CM | POA: Diagnosis not present

## 2017-03-09 ENCOUNTER — Encounter: Payer: Self-pay | Admitting: Cardiology

## 2017-03-09 NOTE — Progress Notes (Signed)
Cardiology Office Note   Date:  03/12/2017   ID:  MAKHARI DOVIDIO, DOB 12-22-1927, MRN [id number]  PCP:  Chipper Herb, MD  Cardiologist:  Dr.  Percival Spanish  Chief Complaint  Patient presents with  . Edema      History of Present Illness: Mark Wilkinson is a 83 y.o. male who presents for follow up of a fib.  He's been in fibrillation since July of 2016.  His wife brought him because he had some right ankle edema. He also had a rash on the dorsum of his foot. He has dementia and doesn't complain of anything.   He noticed the rash but it hasn't bothered him.  He has no itching or pain with this.  He has had no fevers or chills. He gets around slowly with a cane. He's had some mild right ankle edema but is not describing any other cardiovascular symptoms. The patient denies any new symptoms such as chest discomfort, neck or arm discomfort. There has been no new shortness of breath, PND or orthopnea. There have been no reported palpitations, presyncope or syncope.   Past Medical History:  Diagnosis Date  . Arthritis    "knees, hips, back" (03/18/2013)  . Asymptomatic varicose veins   . Atrial fibrillation ([place])   . Calculus of kidney    stones  . Cataract   . Chronic lower back pain   . Dementia   . Essential hypertension, benign   . GERD (gastroesophageal reflux disease)   . Gout, unspecified   . History of hiatal hernia   . Lumbago   . Other and unspecified hyperlipidemia   . PONV (postoperative nausea and vomiting)     Past Surgical History:  Procedure Laterality Date  . APPENDECTOMY    . CATARACT EXTRACTION W/ INTRAOCULAR LENS IMPLANT Left 2000's  . CHOLECYSTECTOMY    . CYSTOSCOPY W/ STONE MANIPULATION     "twice" (03/18/2013)  . CYSTOSCOPY WITH RETROGRADE PYELOGRAM, URETEROSCOPY AND STENT PLACEMENT Left 08/13/2012   Procedure: CYSTOSCOPY WITH RETROGRADE PYELOGRAM, URETEROSCOPY AND STENT PLACEMENT;  Surgeon: Alexis Frock, MD;  [facility];   Service: Urology;  Laterality: Left;  . EYE SURGERY    . TOE AMPUTATION Right ?1970's   "shot his toe off in hunting accident" (03/18/2013) - second toe  . TONSILLECTOMY AND ADENOIDECTOMY     "no tubes" (03/18/2013)  . TOTAL KNEE ARTHROPLASTY Right 12/08/2012   Procedure: RIGHT TOTAL KNEE ARTHROPLASTY;  Surgeon: [name], MD;  Location: [facility];  Service: Orthopedics;  Laterality: Right;  . TOTAL KNEE ARTHROPLASTY Left 01/16/2015   Procedure: TOTAL LEFT KNEE ARTHROPLASTY;  Surgeon: Paralee Cancel, MD;  Location: [facility];  Service: Orthopedics;  Laterality: Left;     Current Outpatient Prescriptions  Medication Sig Dispense Refill  . acetaminophen (TYLENOL) 500 MG tablet Take 1 tablet (500 mg total) by mouth every 6 (six) hours as needed. 30 tablet 0  . allopurinol (ZYLOPRIM) 300 MG tablet TAKE 1 TABLET DAILY 90 tablet 0  . atorvastatin (LIPITOR) 40 MG tablet TAKE 1 TABLET BY MOUTH EVERY DAY 90 tablet 1  . B Complex-C (SUPER B COMPLEX PO) Take 1 tablet by mouth at bedtime.     . Cholecalciferol (VITAMIN D3) 1000 UNITS CAPS Take 2,000 Units by mouth every morning.     . diltiazem (CARDIZEM CD) 120 MG 24 hr capsule TAKE 1 CAPSULE (120 MG TOTAL) BY MOUTH DAILY. 90 capsule 0  . esomeprazole (NEXIUM) 40 MG  capsule TAKE 1 CAPSULE DAILY 90 capsule 0  . furosemide (LASIX) 20 MG tablet TAKE 1 TABLET (20 MG TOTAL) BY MOUTH DAILY. 90 tablet 0  . Multiple Vitamin (MULTIVITAMIN WITH MINERALS) TABS Take 1 tablet by mouth every morning.     . Omega-3 Fatty Acids (FISH OIL) 1000 MG CAPS Take 1,000 mg by mouth 2 (two) times daily.     [name] DELICA LANCETS 96E MISC USE TO CHECK BG ONCE DAILY. DX: E11.9. 100 each 0  . ONETOUCH VERIO test strip USE TO CHECK BLOOD GLUCOSE ONCE DAILY E11.9 100 each 1  . prednisoLONE acetate (PRED FORTE) 1 % ophthalmic suspension INSTILL 1 DROP INTO LEFT EYE 4 TIMES A DAY  3  . Ranibizumab (LUCENTIS) 0.3 MG/0.05ML SOLN Place 1 application into the left eye. Injection q 6-8  weeks    . RAPAFLO 8 MG CAPS capsule TAKE 1 CAPSULE BY MOUTH EVERY DAY WITH FOOD 90 capsule 1  . XARELTO 20 MG TABS tablet TAKE 1 TABLET BY MOUTH EVERY DAY WITH SUPPER 90 tablet 0   No current facility-administered medications for this visit.     Allergies:   Penicillins    ROS:   As stated in the HPI and negative for all other systems.    PHYSICAL EXAM: VS:  BP (!) 88/58   Pulse 75   Ht 5\' 10"  (1.778 m)   Wt 202 lb (91.6 kg)   BMI 28.98 kg/m  , BMI Body mass index is 28.98 kg/m.  GENERAL:  Well appearing NECK:  No jugular venous distention, waveform within normal limits, carotid upstroke brisk and symmetric, no bruits, no thyromegaly LUNGS:  Clear to auscultation bilaterally CHEST:  Unremarkable HEART:  PMI not displaced or sustained,S1 and S2 within normal limits, no S3,  no clicks, no rubs, no murmurs, irregular. ABD:  Flat, positive bowel sounds normal in frequency in pitch, no bruits, no rebound, no guarding, no midline pulsatile mass, no hepatomegaly, no splenomegaly EXT:  2 plus pulses upper, decreased DP/PT right foot, mild right ankle edema, no cyanosis no clubbing, missing second toe right foot.  SKIN:  Right foot rash, raised red without warmth.     EKG:  EKG is  ordered today. Atrial fibrillation, rate 75, axis within normal limits, intervals within normal limits, no acute ST-T wave changes.  03/12/2017   Recent Labs: 01/06/2017: ALT 15; BUN 12; Creatinine, Ser 0.92; Hemoglobin 14.7; Platelets 165; Potassium 4.7; Sodium 144    Lipid Panel    Component Value Date/Time   CHOL 116 01/06/2017 1109   TRIG 66 01/06/2017 1109   TRIG 57 04/09/2016 0900   HDL 41 01/06/2017 1109   HDL 42 04/09/2016 0900   CHOLHDL 2.8 01/06/2017 1109   CHOLHDL 2.4 03/19/2013 0612   VLDL 13 03/19/2013 0612   LDLCALC 62 01/06/2017 1109   LDLCALC 53 02/08/2014 0819       Other studies Reviewed: Additional studies/ records that were reviewed today include: None    ASSESSMENT  AND PLAN:  1. Atrial fib persistent      Mr. WASIF SIMONICH has a CHA2DS2 - VASc score of 3 with a risk of stroke of 3.2%.  No change in therapy is indicated.    2. Anemia-   His most recent labs were OK.  No change in therapy.   3. Chronic diastolic HF - He seems to be euvolemic.  No change in therapy is planned.  4.  Ankle edema and rash:  I  am not clear what the rash might be. It might be starting on the left foot as well.  I don't think this is a drug eruption since it is so localized.  I don't suspect heart failure or issue with the blood thinner.  I have suggested that they talk to Dr. Laurance Flatten or a dermatologist.   5.  Hypotension:  I will stop the atenolol.     Current medicines are reviewed with the patient today.  The patient Has no concerns regarding medicines.  The following changes have been made:  None Labs/ tests ordered today include:    Disposition:   FU with me in six months.    Signed, Minus Breeding, MD  03/12/2017 1:36 PM    [facility]

## 2017-03-11 ENCOUNTER — Ambulatory Visit (INDEPENDENT_AMBULATORY_CARE_PROVIDER_SITE_OTHER): Payer: Medicare Other | Admitting: Cardiology

## 2017-03-11 ENCOUNTER — Encounter: Payer: Self-pay | Admitting: Cardiology

## 2017-03-11 VITALS — BP 88/58 | HR 75 | Ht 70.0 in | Wt 202.0 lb

## 2017-03-11 DIAGNOSIS — H401112 Primary open-angle glaucoma, right eye, moderate stage: Secondary | ICD-10-CM | POA: Diagnosis not present

## 2017-03-11 DIAGNOSIS — I952 Hypotension due to drugs: Secondary | ICD-10-CM | POA: Diagnosis not present

## 2017-03-11 DIAGNOSIS — I4891 Unspecified atrial fibrillation: Secondary | ICD-10-CM | POA: Diagnosis not present

## 2017-03-11 DIAGNOSIS — H401123 Primary open-angle glaucoma, left eye, severe stage: Secondary | ICD-10-CM | POA: Diagnosis not present

## 2017-03-11 DIAGNOSIS — Z961 Presence of intraocular lens: Secondary | ICD-10-CM | POA: Diagnosis not present

## 2017-03-11 DIAGNOSIS — H2511 Age-related nuclear cataract, right eye: Secondary | ICD-10-CM | POA: Diagnosis not present

## 2017-03-11 NOTE — Patient Instructions (Signed)
Medication Instructions:  STOP- Atenolol  If you need a refill on your cardiac medications before your next appointment, please call your pharmacy.  Labwork: None Ordered  Testing/Procedures: None Ordered  Follow-Up: Your physician wants you to follow-up in: 6 Months in [place].    Thank you for choosing [facility] at [facility]!!

## 2017-03-12 ENCOUNTER — Encounter: Payer: Self-pay | Admitting: Cardiology

## 2017-03-12 DIAGNOSIS — M9903 Segmental and somatic dysfunction of lumbar region: Secondary | ICD-10-CM | POA: Diagnosis not present

## 2017-03-12 DIAGNOSIS — M5137 Other intervertebral disc degeneration, lumbosacral region: Secondary | ICD-10-CM | POA: Diagnosis not present

## 2017-03-12 DIAGNOSIS — M9904 Segmental and somatic dysfunction of sacral region: Secondary | ICD-10-CM | POA: Diagnosis not present

## 2017-03-12 DIAGNOSIS — M9905 Segmental and somatic dysfunction of pelvic region: Secondary | ICD-10-CM | POA: Diagnosis not present

## 2017-03-13 ENCOUNTER — Other Ambulatory Visit: Payer: Self-pay | Admitting: Family Medicine

## 2017-03-18 ENCOUNTER — Encounter: Payer: Self-pay | Admitting: Nurse Practitioner

## 2017-03-18 ENCOUNTER — Ambulatory Visit (INDEPENDENT_AMBULATORY_CARE_PROVIDER_SITE_OTHER): Payer: Medicare Other

## 2017-03-18 ENCOUNTER — Ambulatory Visit (INDEPENDENT_AMBULATORY_CARE_PROVIDER_SITE_OTHER): Payer: Medicare Other | Admitting: Nurse Practitioner

## 2017-03-18 VITALS — BP 112/79 | HR 96 | Temp 99.2°F | Ht 70.0 in | Wt 202.0 lb

## 2017-03-18 DIAGNOSIS — M25561 Pain in right knee: Secondary | ICD-10-CM

## 2017-03-18 DIAGNOSIS — M79671 Pain in right foot: Secondary | ICD-10-CM | POA: Diagnosis not present

## 2017-03-18 DIAGNOSIS — M25571 Pain in right ankle and joints of right foot: Secondary | ICD-10-CM

## 2017-03-18 DIAGNOSIS — R300 Dysuria: Secondary | ICD-10-CM

## 2017-03-18 LAB — URINALYSIS, COMPLETE
BILIRUBIN UA: NEGATIVE
GLUCOSE, UA: NEGATIVE
KETONES UA: NEGATIVE
Nitrite, UA: NEGATIVE
SPEC GRAV UA: 1.015 (ref 1.005–1.030)
Urobilinogen, Ur: 2 mg/dL — ABNORMAL HIGH (ref 0.2–1.0)
pH, UA: 7 (ref 5.0–7.5)

## 2017-03-18 LAB — MICROSCOPIC EXAMINATION
BACTERIA UA: NONE SEEN
Renal Epithel, UA: NONE SEEN /hpf

## 2017-03-18 NOTE — Addendum Note (Signed)
Addended by: Rolena Infante on: 03/18/2017 02:22 PM   Modules accepted: Orders

## 2017-03-18 NOTE — Progress Notes (Signed)
   Subjective:    Patient ID: CAVION FAIOLA, male    DOB: 01-21-1928, 83 y.o.   MRN: [id number]  HPI  Patient was at [employer] with his wife eating yesterday. When he stood to get up he got his right foot caught on chair causing him to fall. Another customer in [name] told patient that he twisted his right ankle when he fell. Originally he was not hurting that bad and refused to come to doctor. As evening and night wore his right ankle began to swell and hurt. Hurts to bare any weight on it now.   Review of Systems  Musculoskeletal: Positive for joint swelling (right ankle).  All other systems reviewed and are negative.      Objective:   Physical Exam  Constitutional: He is oriented to person, place, and time. He appears well-developed and well-nourished.  Cardiovascular: Normal rate and regular rhythm.   Pulmonary/Chest: Effort normal and breath sounds normal.  Musculoskeletal:  Right ankle edema with pain on palpation on both sides. No pain with flexion and extension. Does have pain on inversion and eversion. Mild right knee effusion. No patella tenderness. Decreased ROM due to pain on full extension and weight bearing. All ligaments intact.  Neurological: He is alert and oriented to person, place, and time.  Skin: Skin is warm.  Psychiatric: He has a normal mood and affect. His behavior is normal. Judgment and thought content normal.   BP 112/79   Pulse 96   Temp 99.2 F (37.3 C) (Oral)   Ht 5\' 10"  (1.778 m)   Wt 202 lb (91.6 kg)   BMI 28.98 kg/m   Right ankle x ray- no fracture-Preliminary reading by Ronnald Collum, FNP  [facility] Right foot x ray- no fracture-Preliminary reading by Ronnald Collum, FNP  [facility] Right knee x ray- no fracture-Preliminary reading by Ronnald Collum, FNP  [facility]      Assessment & Plan:   1. Right foot pain   2. Acute right ankle pain   3. Acute pain of right knee    Rest Ice Wrap in ace if going to be up on ankle a lot Elevate when sitting Tylenol OTC  for pain rto prn  Mary-Margaret Hassell Done, FNP

## 2017-03-18 NOTE — Patient Instructions (Signed)
RICE for Routine Care of Injuries Many injuries can be cared for using rest, ice, compression, and elevation (RICE therapy). Using RICE therapy can help to lessen pain and swelling. It can help your body to heal. Rest Reduce your normal activities and avoid using the injured part of your body. You can go back to your normal activities when you feel okay and your doctor says it is okay. Ice Do not put ice on your bare skin.  Put ice in a plastic bag.  Place a towel between your skin and the bag.  Leave the ice on for 20 minutes, 2-3 times a day.  Do this for as long as told by your doctor. Compression Compression means putting pressure on the injured area. This can be done with an elastic bandage. If an elastic bandage has been applied:  Remove and reapply the bandage every 3-4 hours or as told by your doctor.  Make sure the bandage is not wrapped too tight. Wrap the bandage more loosely if part of your body beyond the bandage is blue, swollen, cold, painful, or loses feeling (numb).  See your doctor if the bandage seems to make your problems worse.  Elevation Elevation means keeping the injured area raised. Raise the injured area above your heart or the center of your chest if you can. When should I get help? You should get help if:  You keep having pain and swelling.  Your symptoms get worse.  Get help right away if: You should get help right away if:  You have sudden bad pain at or below the area of your injury.  You have redness or more swelling around your injury.  You have tingling or numbness at or below the injury that does not go away when you take off the bandage.  This information is not intended to replace advice given to you by your health care provider. Make sure you discuss any questions you have with your health care provider. Document Released: 10/30/2007 Document Revised: 04/09/2016 Document Reviewed: 04/20/2014 Elsevier Interactive Patient Education  2017  [employer]vier Inc[employer]  

## 2017-03-24 LAB — URINE CULTURE

## 2017-03-25 ENCOUNTER — Telehealth: Payer: Self-pay | Admitting: Family Medicine

## 2017-03-25 ENCOUNTER — Other Ambulatory Visit: Payer: Self-pay | Admitting: Family Medicine

## 2017-03-25 NOTE — Telephone Encounter (Signed)
Per Roselyn Reef, appointment scheduled for 2017-04-01 at 4:00 pm

## 2017-03-26 ENCOUNTER — Emergency Department (HOSPITAL_COMMUNITY)
Admission: EM | Admit: 2017-03-26 | Discharge: 2017-03-26 | Disposition: A | Discharge status: Home / Self Care | Payer: Medicare Other | Attending: Emergency Medicine | Admitting: Emergency Medicine

## 2017-03-26 ENCOUNTER — Encounter (HOSPITAL_COMMUNITY): Payer: Self-pay | Admitting: *Deleted

## 2017-03-26 ENCOUNTER — Emergency Department (HOSPITAL_COMMUNITY): Payer: Medicare Other

## 2017-03-26 DIAGNOSIS — F0390 Unspecified dementia without behavioral disturbance: Secondary | ICD-10-CM | POA: Diagnosis not present

## 2017-03-26 DIAGNOSIS — Z79899 Other long term (current) drug therapy: Secondary | ICD-10-CM | POA: Diagnosis not present

## 2017-03-26 DIAGNOSIS — Z7901 Long term (current) use of anticoagulants: Secondary | ICD-10-CM | POA: Diagnosis not present

## 2017-03-26 DIAGNOSIS — E119 Type 2 diabetes mellitus without complications: Secondary | ICD-10-CM | POA: Diagnosis not present

## 2017-03-26 DIAGNOSIS — Z87891 Personal history of nicotine dependence: Secondary | ICD-10-CM | POA: Insufficient documentation

## 2017-03-26 DIAGNOSIS — Z96653 Presence of artificial knee joint, bilateral: Secondary | ICD-10-CM | POA: Insufficient documentation

## 2017-03-26 DIAGNOSIS — R55 Syncope and collapse: Secondary | ICD-10-CM | POA: Diagnosis not present

## 2017-03-26 DIAGNOSIS — I110 Hypertensive heart disease with heart failure: Secondary | ICD-10-CM | POA: Insufficient documentation

## 2017-03-26 DIAGNOSIS — I5032 Chronic diastolic (congestive) heart failure: Secondary | ICD-10-CM | POA: Insufficient documentation

## 2017-03-26 DIAGNOSIS — R790 Abnormal level of blood mineral: Secondary | ICD-10-CM

## 2017-03-26 DIAGNOSIS — Z9049 Acquired absence of other specified parts of digestive tract: Secondary | ICD-10-CM | POA: Diagnosis not present

## 2017-03-26 DIAGNOSIS — E860 Dehydration: Secondary | ICD-10-CM

## 2017-03-26 DIAGNOSIS — R404 Transient alteration of awareness: Secondary | ICD-10-CM | POA: Diagnosis not present

## 2017-03-26 DIAGNOSIS — J9811 Atelectasis: Secondary | ICD-10-CM | POA: Diagnosis not present

## 2017-03-26 LAB — CBC WITH DIFFERENTIAL/PLATELET
Basophils Absolute: 0 10*3/uL (ref 0.0–0.1)
Basophils Relative: 0 %
EOS ABS: 0 10*3/uL (ref 0.0–0.7)
Eosinophils Relative: 0 %
HEMATOCRIT: 40.2 % (ref 39.0–52.0)
HEMOGLOBIN: 13.2 g/dL (ref 13.0–17.0)
LYMPHS ABS: 1.2 10*3/uL (ref 0.7–4.0)
LYMPHS PCT: 11 %
MCH: 30 pg (ref 26.0–34.0)
MCHC: 32.8 g/dL (ref 30.0–36.0)
MCV: 91.4 fL (ref 78.0–100.0)
MONOS PCT: 7 %
Monocytes Absolute: 0.7 10*3/uL (ref 0.1–1.0)
NEUTROS ABS: 9.4 10*3/uL — AB (ref 1.7–7.7)
NEUTROS PCT: 82 %
Platelets: 206 10*3/uL (ref 150–400)
RBC: 4.4 MIL/uL (ref 4.22–5.81)
RDW: 14 % (ref 11.5–15.5)
WBC: 11.4 10*3/uL — AB (ref 4.0–10.5)

## 2017-03-26 LAB — URINALYSIS, ROUTINE W REFLEX MICROSCOPIC
Bilirubin Urine: NEGATIVE
GLUCOSE, UA: NEGATIVE mg/dL
Ketones, ur: NEGATIVE mg/dL
LEUKOCYTES UA: NEGATIVE
NITRITE: NEGATIVE
PH: 6 (ref 5.0–8.0)
Protein, ur: NEGATIVE mg/dL
SPECIFIC GRAVITY, URINE: 1.02 (ref 1.005–1.030)

## 2017-03-26 LAB — BRAIN NATRIURETIC PEPTIDE: B Natriuretic Peptide: 145.9 pg/mL — ABNORMAL HIGH (ref 0.0–100.0)

## 2017-03-26 LAB — COMPREHENSIVE METABOLIC PANEL
ALK PHOS: 76 U/L (ref 38–126)
ALT: 13 U/L — AB (ref 17–63)
ANION GAP: 11 (ref 5–15)
AST: 25 U/L (ref 15–41)
Albumin: 2.6 g/dL — ABNORMAL LOW (ref 3.5–5.0)
BUN: 14 mg/dL (ref 6–20)
CALCIUM: 8.6 mg/dL — AB (ref 8.9–10.3)
CO2: 27 mmol/L (ref 22–32)
CREATININE: 1.27 mg/dL — AB (ref 0.61–1.24)
Chloride: 105 mmol/L (ref 101–111)
GFR calc non Af Amer: 49 mL/min — ABNORMAL LOW (ref >60)
GFR, EST AFRICAN AMERICAN: 57 mL/min — AB (ref >60)
GLUCOSE: 152 mg/dL — AB (ref 65–99)
Potassium: 3.5 mmol/L (ref 3.5–5.1)
SODIUM: 143 mmol/L (ref 135–145)
Total Bilirubin: 0.7 mg/dL (ref 0.3–1.2)
Total Protein: 6.6 g/dL (ref 6.5–8.1)

## 2017-03-26 LAB — I-STAT TROPONIN, ED
TROPONIN I, POC: 0.01 ng/mL (ref 0.00–0.08)
TROPONIN I, POC: 0.03 ng/mL (ref 0.00–0.08)

## 2017-03-26 LAB — I-STAT CG4 LACTIC ACID, ED
Lactic Acid, Venous: 2.86 mmol/L (ref 0.5–1.9)
Lactic Acid, Venous: 1.64 mmol/L (ref 0.5–1.9)

## 2017-03-26 LAB — URINALYSIS, MICROSCOPIC (REFLEX): BACTERIA UA: NONE SEEN

## 2017-03-26 LAB — LIPASE, BLOOD: Lipase: 23 U/L (ref 11–51)

## 2017-03-26 LAB — PROTIME-INR
INR: 1.8
Prothrombin Time: 20.7 seconds — ABNORMAL HIGH (ref 11.4–15.2)

## 2017-03-26 LAB — MAGNESIUM: MAGNESIUM: 1.2 mg/dL — AB (ref 1.7–2.4)

## 2017-03-26 MED ORDER — MAGNESIUM SULFATE IN D5W 1-5 GM/100ML-% IV SOLN
1.0000 g | Freq: Once | INTRAVENOUS | Status: AC
  Administered 2017-03-26: 1 g via INTRAVENOUS
  Filled 2017-03-26: qty 100

## 2017-03-26 MED ORDER — SODIUM CHLORIDE 0.9 % IV BOLUS (SEPSIS)
500.0000 mL | Freq: Once | INTRAVENOUS | Status: AC
  Administered 2017-03-26: 500 mL via INTRAVENOUS

## 2017-03-26 NOTE — ED Triage Notes (Signed)
Patient was at the eye doctor was in the waiting room and upon walking to the exam room has a near syncopal episode. Wife states he has been having these episodes was recently taken off his b/p medication. Wife states patient always states he is feeling fine and isn't hurting. States he wants to go home

## 2017-03-26 NOTE — Discharge Instructions (Signed)
Your workup today showed evidence of dehydration.  I suspect this is what was causing your fatigue and lightheadedness.  We did not find evidence of severe infections.  Please try and stay hydrated.  Please continue holding his atenolol as his blood pressures have been soft.  Please follow-up with his primary care physician in several days.  If any symptoms change or worsen, please return immediately to the nearest emergency department.

## 2017-03-26 NOTE — ED Provider Notes (Signed)
[facility] EMERGENCY DEPARTMENT Provider Note   CSN: [id number] Arrival date & time: 03/26/17  5400     History   Chief Complaint Chief Complaint  Patient presents with  . Near Syncope    HPI Mark Wilkinson is a 83 y.o. male.  The history is provided by the patient, the spouse and medical records. The history is limited by the condition of the patient.  Near Syncope  This is a new problem. The current episode started more than 2 days ago. The problem occurs daily. The problem has not changed since onset.Associated symptoms include shortness of breath. Pertinent negatives include no chest pain, no abdominal pain and no headaches. The symptoms are aggravated by walking. Nothing relieves the symptoms. He has tried nothing for the symptoms. The treatment provided no relief.    Past Medical History:  Diagnosis Date  . Arthritis    "knees, hips, back" (03/18/2013)  . Asymptomatic varicose veins   . Atrial fibrillation ([place])   . Calculus of kidney    stones  . Cataract   . Chronic lower back pain   . Dementia   . Essential hypertension, benign   . GERD (gastroesophageal reflux disease)   . Gout, unspecified   . History of hiatal hernia   . Lumbago   . Other and unspecified hyperlipidemia   . PONV (postoperative nausea and vomiting)     Patient Active Problem List   Diagnosis Date Noted  . Osteopenia of the elderly 09/20/2015  . Memory impairment 08/04/2015  . Type 2 diabetes mellitus ([place]) 06/21/2015  . Chronic diastolic CHF (congestive heart failure) ([place]) 01/24/2015  . Atrial fibrillation with RVR ([place]) 01/23/2015  . Shortness of breath 01/23/2015  . Physical deconditioning 01/23/2015  . Dementia 01/23/2015  . Essential hypertension 01/23/2015  . Diastolic CHF ([place]) 86[birth year]76[birth year]1950  . S/P left TKA 01/16/2015  . Atrial fibrillation, persistent ([place]) 02/25/2014  . Metabolic syndrome 93/26/7124  . BPH (benign prostatic hyperplasia) 06/16/2013  . Chest  pain 03/18/2013  . Acute encephalopathy 12/11/2012  . Hypotension, unspecified 12/11/2012  . Elevated troponin 12/11/2012  . Expected blood loss anemia 12/09/2012  . Obese 12/09/2012  . S/P right TKA 12/08/2012  . Preop cardiovascular exam 11/25/2012  . Essential hypertension, benign   . Other and unspecified hyperlipidemia   . Diaphragmatic hernia without mention of obstruction or gangrene   . Gout, unspecified   . Calculus of kidney   . [place], [place]   . Asymptomatic varicose veins   . Lumbago     Past Surgical History:  Procedure Laterality Date  . APPENDECTOMY    . CATARACT EXTRACTION W/ INTRAOCULAR LENS IMPLANT Left 2000's  . CHOLECYSTECTOMY    . CYSTOSCOPY W/ STONE MANIPULATION     "twice" (03/18/2013)  . CYSTOSCOPY WITH RETROGRADE PYELOGRAM, URETEROSCOPY AND STENT PLACEMENT Left 08/13/2012   Procedure: CYSTOSCOPY WITH RETROGRADE PYELOGRAM, URETEROSCOPY AND STENT PLACEMENT;  Surgeon: Alexis Frock, MD;  Location: [facility];  Service: Urology;  Laterality: Left;  . EYE SURGERY    . TOE AMPUTATION Right ?1970's   "shot his toe off in hunting accident" (03/18/2013) - second toe  . TONSILLECTOMY AND ADENOIDECTOMY     "no tubes" (03/18/2013)  . TOTAL KNEE ARTHROPLASTY Right 12/08/2012   Procedure: RIGHT TOTAL KNEE ARTHROPLASTY;  Surgeon: [name], MD;  Location: [facility];  Service: Orthopedics;  Laterality: Right;  . TOTAL KNEE ARTHROPLASTY Left 01/16/2015   Procedure: TOTAL LEFT KNEE ARTHROPLASTY;  Surgeon: Paralee Cancel, MD;  Location: [facility];  Service: Orthopedics;  Laterality: Left;       Home Medications    Prior to Admission medications   Medication Sig Start Date End Date Taking? Authorizing Provider  acetaminophen (TYLENOL) 500 MG tablet Take 1 tablet (500 mg total) by mouth every 6 (six) hours as needed. 01/18/15   Danae Orleans, PA-C  allopurinol (ZYLOPRIM) 300 MG tablet TAKE 1 TABLET DAILY 03/14/17   Chipper Herb, MD    atorvastatin (LIPITOR) 40 MG tablet TAKE 1 TABLET BY MOUTH EVERY DAY 02/03/17   Chipper Herb, MD  B Complex-C (SUPER B COMPLEX PO) Take 1 tablet by mouth at bedtime.     [provider]  Cholecalciferol (VITAMIN D3) 1000 UNITS CAPS Take 2,000 Units by mouth every morning.     [provider]  diltiazem (CARDIZEM CD) 120 MG 24 hr capsule TAKE 1 CAPSULE (120 MG TOTAL) BY MOUTH DAILY. 02/10/17   Chipper Herb, MD  esomeprazole (NEXIUM) 40 MG capsule TAKE 1 CAPSULE DAILY 01/08/17   Chipper Herb, MD  furosemide (LASIX) 20 MG tablet TAKE 1 TABLET (20 MG TOTAL) BY MOUTH DAILY. 02/12/17   Minus Breeding, MD  Multiple Vitamin (MULTIVITAMIN WITH MINERALS) TABS Take 1 tablet by mouth every morning.     [provider]  Omega-3 Fatty Acids (FISH OIL) 1000 MG CAPS Take 1,000 mg by mouth 2 (two) times daily.     [provider]  [facility] DELICA LANCETS 92J MISC USE TO CHECK BG ONCE DAILY. DX: E11.9. 11/29/16   Chipper Herb, MD  [facility] VERIO test strip USE TO CHECK BLOOD GLUCOSE ONCE DAILY E11.9 11/29/16   Chipper Herb, MD  prednisoLONE acetate (PRED FORTE) 1 % ophthalmic suspension INSTILL 1 DROP INTO LEFT EYE 4 TIMES A DAY 01/25/17   [provider]  Ranibizumab (LUCENTIS) 0.3 MG/0.05ML SOLN Place 1 application into the left eye. Injection q 6-8 weeks    [provider]  RAPAFLO 8 MG CAPS capsule TAKE 1 CAPSULE BY MOUTH EVERY DAY WITH FOOD 03/25/17   Chipper Herb, MD  XARELTO 20 MG TABS tablet TAKE 1 TABLET BY MOUTH EVERY DAY WITH SUPPER 02/10/17   Chipper Herb, MD    Family History Family History  Problem Relation Age of Onset  . Heart disease Mother        Died in her 36s  . Hypertension Mother   . Dementia Mother   . Stroke Father   . CAD Brother        MI 31s  . Heart disease Brother   . Stroke Brother   . Epilepsy Brother   . Dementia Sister   . Hypertension Sister   . Stroke Sister   . Hypertension Daughter   . Hypertension  Son   . Hyperlipidemia Son   . Hypertension Daughter     Social History Social History  Substance Use Topics  . Smoking status: Former Smoker    Packs/day: 0.12    Years: 10.00    Types: Cigarettes    Quit date: 12/11/1960  . Smokeless tobacco: Never Used  . Alcohol use No     Allergies   Penicillins   Review of Systems Review of Systems  Constitutional: Positive for fatigue. Negative for chills, diaphoresis and fever.  HENT: Negative for congestion and rhinorrhea.   Eyes: Negative for visual disturbance.  Respiratory: Positive for shortness of breath. Negative for cough, choking, chest  tightness and wheezing.   Cardiovascular: Positive for near-syncope. Negative for chest pain, palpitations and leg swelling.  Gastrointestinal: Positive for nausea. Negative for abdominal distention, abdominal pain, blood in stool and vomiting.  Genitourinary: Negative for flank pain and frequency.  Musculoskeletal: Negative for back pain, neck pain and neck stiffness.  Skin: Positive for rash. Negative for wound.  Neurological: Negative for dizziness, syncope, weakness, light-headedness and headaches.  Psychiatric/Behavioral: Negative for agitation.  All other systems reviewed and are negative.    Physical Exam Updated Vital Signs BP 119/76 (BP Location: Right Arm)   Pulse (!) 118   Temp 98.4 F (36.9 C) (Oral)   Resp 18   SpO2 98%   Physical Exam  Constitutional: He appears well-developed and well-nourished. No distress.  HENT:  Head: Normocephalic and atraumatic.  Mouth/Throat: Oropharynx is clear and moist. No oropharyngeal exudate.  Eyes: Pupils are equal, round, and reactive to light. Conjunctivae and EOM are normal.  Neck: Normal range of motion. Neck supple.  Cardiovascular: Intact distal pulses.  An irregularly irregular rhythm present. Tachycardia present.   No murmur heard. Pulmonary/Chest: Effort normal and breath sounds normal. No stridor. No respiratory distress.  He has no wheezes. He exhibits no tenderness.  Abdominal: Soft. There is no tenderness. There is no guarding.  Musculoskeletal: He exhibits no edema or tenderness.  Neurological: He is alert. No sensory deficit. He exhibits normal muscle tone.  Skin: Skin is warm and dry. Capillary refill takes less than 2 seconds. Rash noted. He is not diaphoretic. No pallor.     Psychiatric: He has a normal mood and affect.  Nursing note and vitals reviewed.    ED Treatments / Results  Labs (all labs ordered are listed, but only abnormal results are displayed) Labs Reviewed  CBC WITH DIFFERENTIAL/PLATELET - Abnormal; Notable for the following:       Result Value   WBC 11.4 (*)    Neutro Abs 9.4 (*)    All other components within normal limits  COMPREHENSIVE METABOLIC PANEL - Abnormal; Notable for the following:    Glucose, Bld 152 (*)    Creatinine, Ser 1.27 (*)    Calcium 8.6 (*)    Albumin 2.6 (*)    ALT 13 (*)    GFR calc non Af Amer 49 (*)    GFR calc Af Amer 57 (*)    All other components within normal limits  MAGNESIUM - Abnormal; Notable for the following:    Magnesium 1.2 (*)    All other components within normal limits  URINALYSIS, ROUTINE W REFLEX MICROSCOPIC - Abnormal; Notable for the following:    Hgb urine dipstick SMALL (*)    All other components within normal limits  BRAIN NATRIURETIC PEPTIDE - Abnormal; Notable for the following:    B Natriuretic Peptide 145.9 (*)    All other components within normal limits  PROTIME-INR - Abnormal; Notable for the following:    Prothrombin Time 20.7 (*)    All other components within normal limits  URINALYSIS, MICROSCOPIC (REFLEX) - Abnormal; Notable for the following:    Squamous Epithelial / LPF 0-5 (*)    All other components within normal limits  I-STAT CG4 LACTIC ACID, ED - Abnormal; Notable for the following:    Lactic Acid, Venous 2.86 (*)    All other components within normal limits  URINE CULTURE  LIPASE, BLOOD  I-STAT  TROPONIN, ED  I-STAT CG4 LACTIC ACID, ED  I-STAT TROPONIN, ED    EKG  EKG  Interpretation  Date/Time:  Wednesday March 26 2017 09:30:00 EDT Ventricular Rate:  106 PR Interval:    QRS Duration: 114 QT Interval:  345 QTC Calculation: 459 R Axis:   20 Text Interpretation:  Atrial fibrillation Borderline intraventricular conduction delay Low voltage, extremity and precordial leads When compared to prior, similar Afib. No STEMI Confirmed by Antony Blackbird [phone]) on 03/26/2017 9:33:01 AM       Radiology Dg Chest 2 View  Result Date: 03/26/2017 CLINICAL DATA:  83 year old male with near syncope at doctor office today. Fatigue, pale, lightheaded. EXAM: CHEST  2 VIEW COMPARISON:  01/15/2017 and earlier. FINDINGS: Seated upright AP and lateral views of the chest. Moderate chronic elevation of the right hemidiaphragm with somewhat lower lung volumes overall today. Stable mild cardiomegaly and tortuosity of the thoracic aorta. No pneumothorax, pulmonary edema, pleural effusion or consolidation. Bibasilar atelectasis suspected. No acute osseous abnormality identified. Stable cholecystectomy clips. Negative visible bowel gas pattern. IMPRESSION: 1. Lower lung volumes with atelectasis, but no other acute cardiopulmonary abnormality. 2. 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 Electronically Signed   By: Genevie Ann M.D.   On: 03/26/2017 10:42   Ct Head Wo Contrast  Result Date: 03/26/2017 CLINICAL DATA:  83 year old male status post near syncopal episode at doctor office this morning. EXAM: CT HEAD WITHOUT CONTRAST TECHNIQUE: Contiguous axial images were obtained from the base of the skull through the vertex without intravenous contrast. COMPARISON:  None. FINDINGS: Brain: Cerebral volume loss with some disproportionate involvement of the anterior temporal lobes and left parietal lobe. No ventriculomegaly. No acute intracranial hemorrhage  identified. No midline shift, mass effect, or evidence of intracranial mass lesion. Mild for age scattered white matter hypodensity. No cortically based acute infarct identified. No cortical encephalomalacia identified. Vascular: Calcified atherosclerosis at the skull base. No suspicious intracranial vascular hyperdensity. Skull: No acute osseous abnormality identified. Sinuses/Orbits: Small 18 mm probable mucous retention cyst in the right maxillary sinus. Mild ethmoid mucosal thickening. Other paranasal sinuses, bilateral tympanic cavities, and mastoids are clear. Other: Postoperative changes to the left globe. No acute orbit or scalp soft tissue findings. IMPRESSION: 1.  No acute intracranial abnormality. 2. Disproportionate cerebral volume loss in both temporal and parietal lobes, such as can be seen with neurodegeneration and dementia. 3. Mild for age nonspecific white matter changes, most commonly due to small vessel disease. Electronically Signed   By: Genevie Ann M.D.   On: 03/26/2017 10:23    Procedures Procedures (including critical care time)  Medications Ordered in ED Medications  sodium chloride 0.9 % bolus 500 mL (0 mLs Intravenous Stopped 03/26/17 1255)  magnesium sulfate IVPB 1 g 100 mL (0 g Intravenous Stopped 03/26/17 1540)  sodium chloride 0.9 % bolus 500 mL (0 mLs Intravenous Stopped 03/26/17 1541)     Initial Impression / Assessment and Plan / ED Course  I have reviewed the triage vital signs and the nursing notes.  Pertinent labs & imaging results that were available during my care of the patient were reviewed by me and considered in my medical decision making (see chart for details).     Mark Wilkinson is a 83 y.o. male with a past medical history significant for dementia, hypertension, atrial fibrillation on Xarelto, GERD, and kidney stones who presents with near syncopal episodes, fall, and exertional shortness of breath.  Patient is coming by his wife who reports that for the  last week, patient has been feeling fatigued.  She says that when he tries to walk around he gets very lightheaded and short of breath.  She says that 2 weeks ago when he saw his PCP he had his atenolol discontinued because he had soft blood pressures.  She reports that the patient has not complained of any chest pain just the shortness of breath.  He has not had fevers, chills, congestion, or cough to her knowledge.  Patient has had nausea but no vomiting.  She says that he has not been eating or drinking as much over the last week.  She says that he has had no urinary symptoms, constipation, or diarrhea.  He has had no dark bowel movements or rectal bleeding by his report.  Patient perseverates on wanting to go home and leave.  Wife does report that he has had some purple spots appear on his bilateral ankles.  She says these are new.  Patient's wife does report that he had a fall several days ago but she is unsure if he hit his head that she did not see the fall.  He denies any headache, vision changes, or other neurologic complaints.  Patient was going to see his eye doctor today and got very lightheaded and near syncopal while walking from the waiting room.  Patient was thus sent to the ED for evaluation.  On exam, lungs are clear.  Chest is nontender.  Abdomen nontender.  Patient has rash on his bilateral lower extremities right greater than left that does appear petechial and purpral.  Patient's oropharynx showed no petechiae.  Patient's neck and head were nontender.  No significant bruising seen on his head.  No lacerations.  Patient has an area on his right occiput that had a recently removed skin cancer.  Patient had no back tenderness or extremity tenderness.  Patient says that he wants to go home.  Based on symptoms and patient's initial blood pressure at 235 systolic, patient will have workup to look for dehydration, occult infection, or traumatic injury.  Patient will have laboratory testing and  imaging.  Patient will be given small amount of fluids during initial workup.    Anticipate reassessed after workup.        Initial EKG showed atrial fibrillation with a rate of just over 100.  When I was evaluating patient, it was in the 110's.  As patient is off his atenolol, RVR could be an etiology of his symptoms when he is trying to exert himself.  Will monitor on telemetry.  Patient's laboratory testing results are seen above.  Patient found to have low magnesium.  Magnesium was repleted through IV.  Initial lactic acid elevated, suspect dehydration.  Fluids given.  Repeat lactic acid improving.  Troponin negative x2.  Urinalysis shows no UTI.  BNP improved from prior.  No shortness of breath.  Lipase not elevated.  Metabolic panel showed slight increase in creatinine also likely due to dehydration but electrolytes otherwise reassuring.  Chest x-ray shows atelectasis with no acute cardiopulmonary abnormality.  CT head shows no acute intracranial abnormalities.  Patient reported feeling better after fluids.  Patient was able to stand and according to wife, he was feeling at his baseline.  I asked the patient if he was feeling bad and he said he felt normal.  He denied lightheadedness or shortness of breath.  Patient was feeling at his baseline.    Given improvement in symptoms after fluids, suspect dehydration was the primary cause of his symptoms.  Patient will follow up with  his PCP in several days.  Patient encouraged to continue holding his blood pressure medication atenolol.  Patient and wife understood return precautions for any new or worsening symptoms.  Patient had no other persons or concerns and was discharged in good condition.   Final Clinical Impressions(s) / ED Diagnoses   Final diagnoses:  Syncope, unspecified syncope type  Dehydration  Low magnesium level    New Prescriptions Discharge Medication List as of 03/26/2017  3:27 PM      Clinical Impression: 1. Syncope,  unspecified syncope type   2. Dehydration   3. Low magnesium level     Disposition: Discharge  Condition: Good  I have discussed the results, Dx and Tx plan with the pt(& family if present). He/she/they expressed understanding and agree(s) with the plan. Discharge instructions discussed at great length. Strict return precautions discussed and pt &/or family have verbalized understanding of the instructions. No further questions at time of discharge.    New Prescriptions   No medications on file    Follow Up: Chipper Herb, [place] [address] [phone]     [facility] [address] [phone] mc [place]       Emmerie Battaglia, Gwenyth Allegra, MD 03/26/17 2135

## 2017-03-27 LAB — URINE CULTURE: Culture: <10000 — AB

## 2017-03-31 ENCOUNTER — Emergency Department (HOSPITAL_COMMUNITY): Payer: Medicare Other

## 2017-03-31 ENCOUNTER — Ambulatory Visit: Payer: Medicare Other | Admitting: Family Medicine

## 2017-03-31 ENCOUNTER — Other Ambulatory Visit: Payer: Self-pay

## 2017-03-31 ENCOUNTER — Emergency Department (HOSPITAL_COMMUNITY)
Admission: EM | Admit: 2017-03-31 | Discharge: 2017-04-26 | Disposition: E | Payer: Medicare Other | Attending: Nephrology | Admitting: Nephrology

## 2017-03-31 ENCOUNTER — Encounter (HOSPITAL_COMMUNITY): Payer: Self-pay | Admitting: Emergency Medicine

## 2017-03-31 DIAGNOSIS — I469 Cardiac arrest, cause unspecified: Secondary | ICD-10-CM | POA: Diagnosis not present

## 2017-03-31 DIAGNOSIS — I4891 Unspecified atrial fibrillation: Secondary | ICD-10-CM | POA: Diagnosis not present

## 2017-03-31 DIAGNOSIS — R112 Nausea with vomiting, unspecified: Secondary | ICD-10-CM | POA: Diagnosis not present

## 2017-03-31 DIAGNOSIS — R10817 Generalized abdominal tenderness: Secondary | ICD-10-CM | POA: Diagnosis not present

## 2017-03-31 DIAGNOSIS — Z7901 Long term (current) use of anticoagulants: Secondary | ICD-10-CM | POA: Insufficient documentation

## 2017-03-31 DIAGNOSIS — J690 Pneumonitis due to inhalation of food and vomit: Secondary | ICD-10-CM | POA: Insufficient documentation

## 2017-03-31 DIAGNOSIS — Z87891 Personal history of nicotine dependence: Secondary | ICD-10-CM | POA: Diagnosis not present

## 2017-03-31 DIAGNOSIS — Z79899 Other long term (current) drug therapy: Secondary | ICD-10-CM | POA: Diagnosis not present

## 2017-03-31 DIAGNOSIS — J189 Pneumonia, unspecified organism: Secondary | ICD-10-CM | POA: Diagnosis not present

## 2017-03-31 DIAGNOSIS — R109 Unspecified abdominal pain: Secondary | ICD-10-CM | POA: Diagnosis not present

## 2017-03-31 DIAGNOSIS — K56609 Unspecified intestinal obstruction, unspecified as to partial versus complete obstruction: Secondary | ICD-10-CM | POA: Diagnosis not present

## 2017-03-31 DIAGNOSIS — K56699 Other intestinal obstruction unspecified as to partial versus complete obstruction: Secondary | ICD-10-CM | POA: Insufficient documentation

## 2017-03-31 DIAGNOSIS — E119 Type 2 diabetes mellitus without complications: Secondary | ICD-10-CM | POA: Insufficient documentation

## 2017-03-31 DIAGNOSIS — Z4682 Encounter for fitting and adjustment of non-vascular catheter: Secondary | ICD-10-CM | POA: Diagnosis not present

## 2017-03-31 DIAGNOSIS — K2970 Gastritis, unspecified, without bleeding: Secondary | ICD-10-CM | POA: Diagnosis not present

## 2017-03-31 DIAGNOSIS — K439 Ventral hernia without obstruction or gangrene: Secondary | ICD-10-CM | POA: Diagnosis not present

## 2017-03-31 DIAGNOSIS — F0390 Unspecified dementia without behavioral disturbance: Secondary | ICD-10-CM | POA: Insufficient documentation

## 2017-03-31 DIAGNOSIS — R1110 Vomiting, unspecified: Secondary | ICD-10-CM | POA: Diagnosis present

## 2017-03-31 LAB — TROPONIN I: TROPONIN I: 0.03 ng/mL — AB (ref <0.03)

## 2017-03-31 LAB — COMPREHENSIVE METABOLIC PANEL
ALBUMIN: 3.2 g/dL — AB (ref 3.5–5.0)
ALK PHOS: 87 U/L (ref 38–126)
ALT: 14 U/L — AB (ref 17–63)
AST: 28 U/L (ref 15–41)
Anion gap: 14 (ref 5–15)
BILIRUBIN TOTAL: 0.7 mg/dL (ref 0.3–1.2)
BUN: 15 mg/dL (ref 6–20)
CALCIUM: 9.4 mg/dL (ref 8.9–10.3)
CO2: 26 mmol/L (ref 22–32)
CREATININE: 1.28 mg/dL — AB (ref 0.61–1.24)
Chloride: 99 mmol/L — ABNORMAL LOW (ref 101–111)
GFR calc Af Amer: 56 mL/min — ABNORMAL LOW (ref >60)
GFR calc non Af Amer: 49 mL/min — ABNORMAL LOW (ref >60)
Glucose, Bld: 196 mg/dL — ABNORMAL HIGH (ref 65–99)
POTASSIUM: 3.7 mmol/L (ref 3.5–5.1)
Sodium: 139 mmol/L (ref 135–145)
TOTAL PROTEIN: 7.7 g/dL (ref 6.5–8.1)

## 2017-03-31 LAB — CBC WITH DIFFERENTIAL/PLATELET
BASOS ABS: 0 10*3/uL (ref 0.0–0.1)
BASOS PCT: 0 %
EOS ABS: 0 10*3/uL (ref 0.0–0.7)
Eosinophils Relative: 0 %
HEMATOCRIT: 46.6 % (ref 39.0–52.0)
HEMOGLOBIN: 15.2 g/dL (ref 13.0–17.0)
Lymphocytes Relative: 4 %
Lymphs Abs: 1 10*3/uL (ref 0.7–4.0)
MCH: 29.7 pg (ref 26.0–34.0)
MCHC: 32.6 g/dL (ref 30.0–36.0)
MCV: 91.2 fL (ref 78.0–100.0)
MONOS PCT: 5 %
Monocytes Absolute: 1.1 10*3/uL — ABNORMAL HIGH (ref 0.1–1.0)
NEUTROS ABS: 21.1 10*3/uL — AB (ref 1.7–7.7)
NEUTROS PCT: 91 %
Platelets: 280 10*3/uL (ref 150–400)
RBC: 5.11 MIL/uL (ref 4.22–5.81)
RDW: 13.9 % (ref 11.5–15.5)
WBC: 23.3 10*3/uL — AB (ref 4.0–10.5)

## 2017-03-31 LAB — URINALYSIS, ROUTINE W REFLEX MICROSCOPIC
GLUCOSE, UA: NEGATIVE mg/dL
Hgb urine dipstick: NEGATIVE
Ketones, ur: 5 mg/dL — AB
NITRITE: NEGATIVE
PH: 5 (ref 5.0–8.0)
Protein, ur: 30 mg/dL — AB
SPECIFIC GRAVITY, URINE: 1.031 — AB (ref 1.005–1.030)

## 2017-03-31 MED ORDER — SODIUM CHLORIDE 0.9 % IV BOLUS (SEPSIS)
1000.0000 mL | Freq: Once | INTRAVENOUS | Status: AC
  Administered 2017-03-31: 1000 mL via INTRAVENOUS

## 2017-03-31 MED ORDER — IOPAMIDOL (ISOVUE-300) INJECTION 61%: 100.0000 mL | Freq: Once | INTRAVENOUS | Status: DC | PRN

## 2017-03-31 MED ORDER — MIDAZOLAM HCL 2 MG/2ML IJ SOLN
INTRAMUSCULAR | Status: AC
  Filled 2017-03-31: qty 2

## 2017-03-31 MED ORDER — EPINEPHRINE PF 1 MG/10ML IJ SOSY
PREFILLED_SYRINGE | INTRAMUSCULAR | Status: AC | PRN
  Administered 2017-03-31 (×5): 1 via INTRAVENOUS

## 2017-03-31 MED ORDER — ONDANSETRON HCL 4 MG/2ML IJ SOLN
4.0000 mg | Freq: Once | INTRAMUSCULAR | Status: AC
  Administered 2017-03-31: 4 mg via INTRAVENOUS
  Filled 2017-03-31: qty 2

## 2017-03-31 MED ORDER — LEVOFLOXACIN IN D5W 750 MG/150ML IV SOLN
750.0000 mg | Freq: Once | INTRAVENOUS | Status: AC
Start: 2017-03-31 — End: 2017-03-31
  Administered 2017-03-31: 750 mg via INTRAVENOUS
  Filled 2017-03-31: qty 150

## 2017-03-31 MED ORDER — DILTIAZEM HCL-DEXTROSE 100-5 MG/100ML-% IV SOLN (PREMIX)
5.0000 mg/h | INTRAVENOUS | Status: DC
  Administered 2017-03-31: 5 mg/h via INTRAVENOUS
  Filled 2017-03-31: qty 100

## 2017-03-31 MED ORDER — DILTIAZEM LOAD VIA INFUSION
10.0000 mg | Freq: Once | INTRAVENOUS | Status: AC
  Administered 2017-03-31: 10 mg via INTRAVENOUS
  Filled 2017-03-31: qty 10

## 2017-03-31 MED ORDER — EPINEPHRINE PF 1 MG/ML IJ SOLN
0.5000 ug/min | INTRAMUSCULAR | Status: DC
  Administered 2017-03-31: 1 ug/min via INTRAVENOUS
  Filled 2017-03-31: qty 4

## 2017-03-31 MED ORDER — NOREPINEPHRINE BITARTRATE 1 MG/ML IV SOLN
0.0000 ug/min | INTRAVENOUS | Status: DC
  Administered 2017-03-31: 2 ug/min via INTRAVENOUS
  Filled 2017-03-31: qty 4

## 2017-03-31 MED ORDER — DEXTROSE 5 % IV SOLN
INTRAVENOUS | Status: AC | PRN
  Administered 2017-03-31: 300 mg via INTRAVENOUS

## 2017-03-31 MED ORDER — IOPAMIDOL (ISOVUE-370) INJECTION 76%: 80.0000 mL | Freq: Once | INTRAVENOUS | Status: DC | PRN

## 2017-03-31 MED ORDER — IOPAMIDOL (ISOVUE-300) INJECTION 61%
100.0000 mL | Freq: Once | INTRAVENOUS | Status: AC | PRN
  Administered 2017-03-31: 80 mL via INTRAVENOUS

## 2017-04-01 MED FILL — Medication: Qty: 1 | Status: AC

## 2017-04-26 NOTE — ED Notes (Signed)
CRITICAL VALUE ALERT  Critical Value:  Troponin 0.03   Date & Time Notied:  1030 2017-04-02  Provider Notified: Noemi Chapel  Orders Received/Actions taken: will continue to monitor
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
## 2017-04-26 NOTE — Code Documentation (Signed)
200 joule shock given

## 2017-04-26 NOTE — ED Notes (Signed)
Pt sats 88-90 on 3L. Pt tugging more at breathing. Chest sounds lower rhonchi noted. MD notified. Heart rate remains 120-150s. Cardizem ordered per MD now.

## 2017-04-26 NOTE — ED Triage Notes (Signed)
Here today for increased nausea/vomiting since yesterday.

## 2017-04-26 NOTE — ED Notes (Signed)
Critical care paged by [facility] to Dr Sabra Heck @ [phone]

## 2017-04-26 NOTE — Code Documentation (Signed)
200 joule shock given at 1356

## 2017-04-26 NOTE — Code Documentation (Signed)
Pulses returned.  Tachycardia.  CPR stopped.

## 2017-04-26 DEATH — deceased

## 2017-04-30 ENCOUNTER — Ambulatory Visit: Payer: 59 | Admitting: Cardiology

## 2017-06-04 ENCOUNTER — Ambulatory Visit: Payer: Medicare Other | Admitting: Family Medicine

## 2018-09-17 IMAGING — DX DG ABDOMEN ACUTE W/ 1V CHEST
5 series · 5 of 5 positions shown · non-contrast
Comparison: Chest radiographs 03/26/2017 and earlier, including CT
Abdomen and Pelvis 07/29/2012.

CLINICAL DATA: 89-year-old male who began complaining of abdominal
pain yesterday which progressed to vomiting last night.

EXAM:
DG ABDOMEN ACUTE W/ 1V CHEST

[abdomen erect]
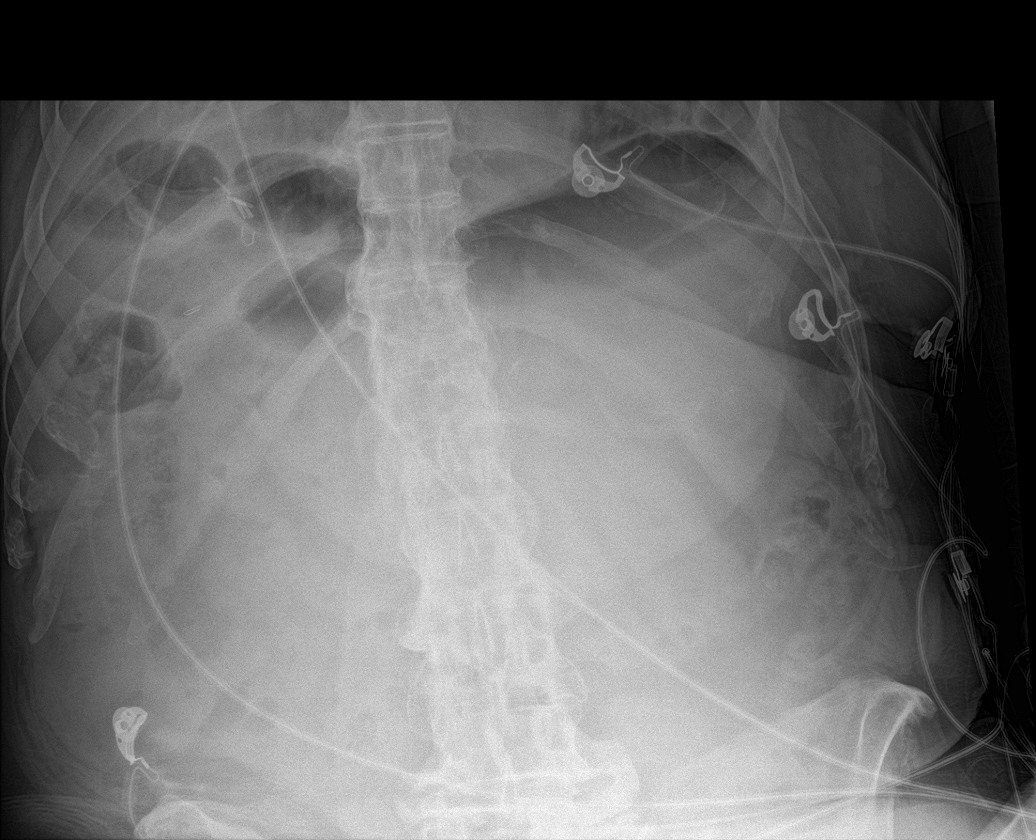

[abdomen supine (1 of 3)]
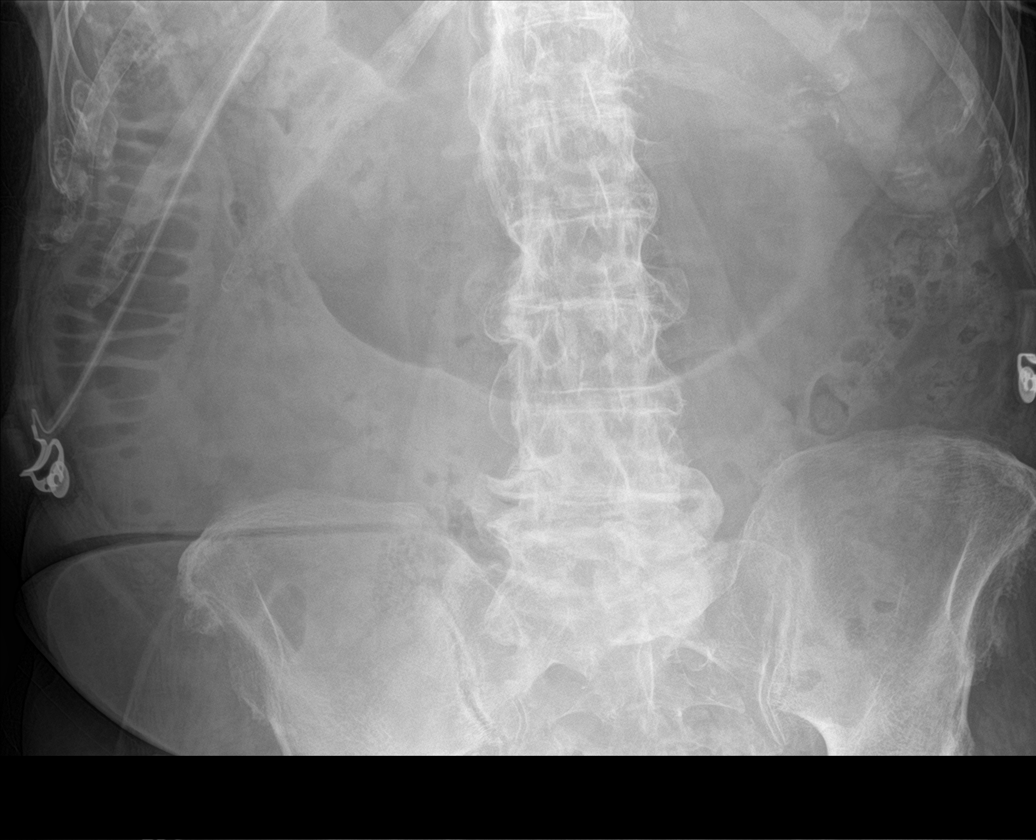

[abdomen supine (2 of 3)]
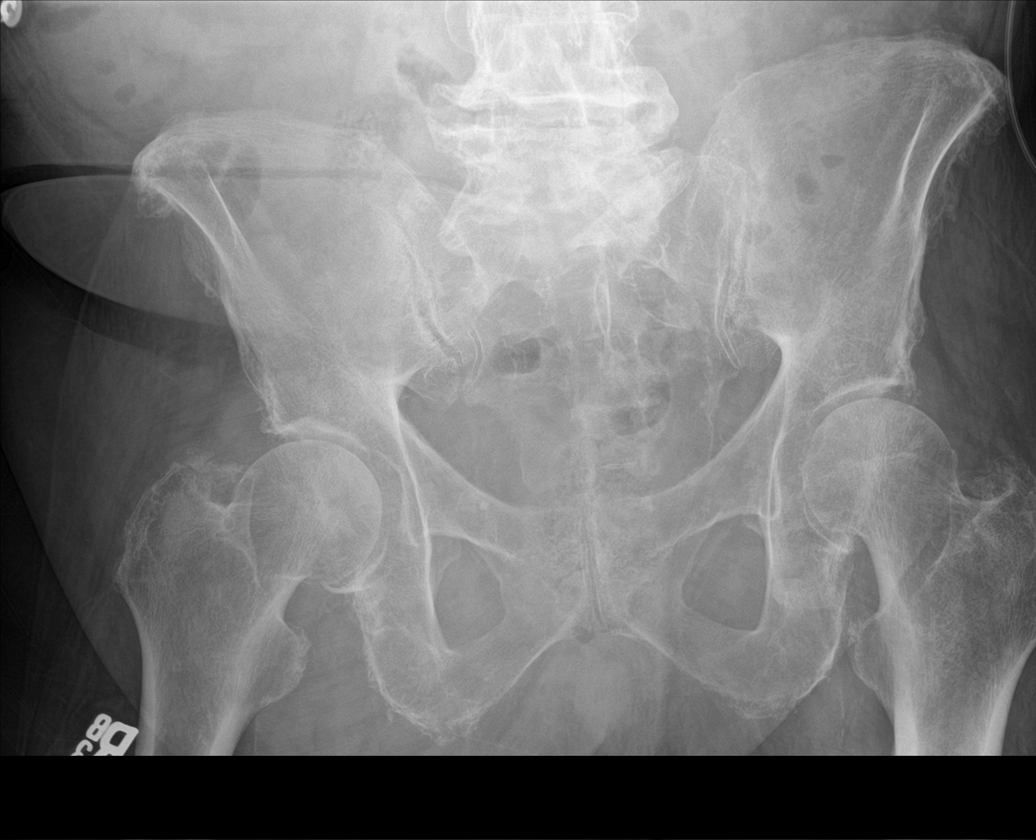

[chest ap]
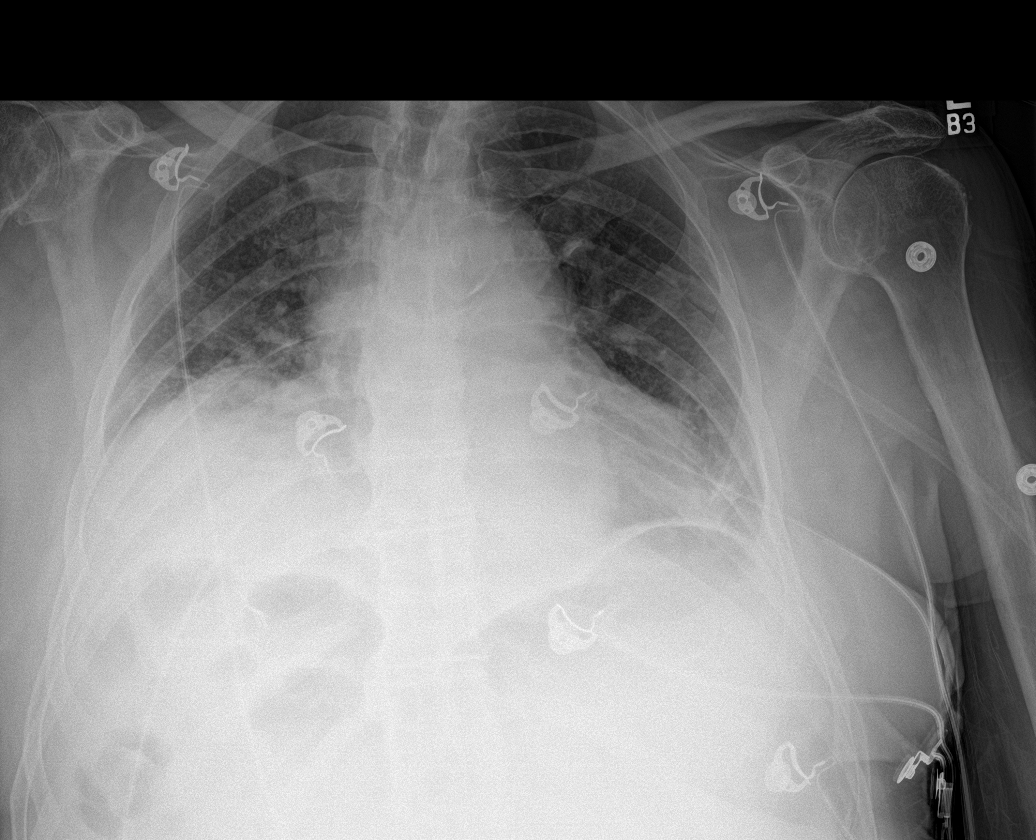

[abdomen supine (3 of 3)]
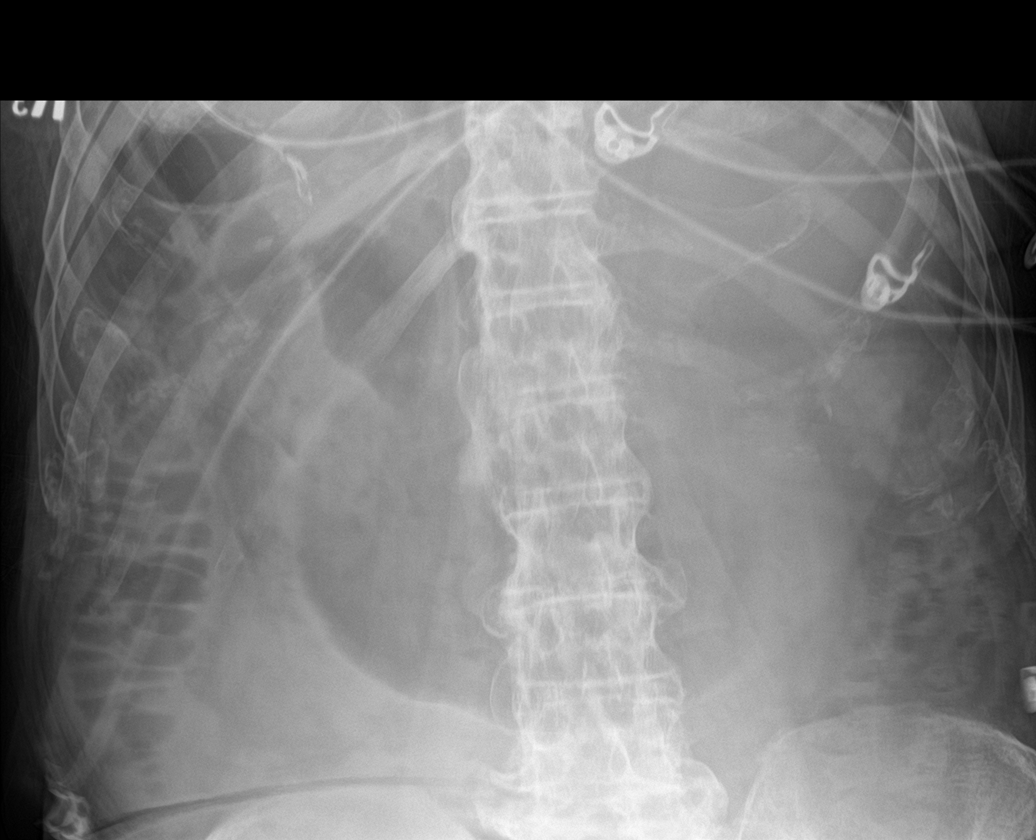

[5 of 5 positions shown; findings below may reference images not displayed]

FINDINGS: AP upright view of the chest, and upright and supine views of the
abdomen and pelvis.

Chronic elevation of the right hemidiaphragm. Suggestion of
increased right lung base density compared to one week ago. Stable
cardiac size and mediastinal contours. Stable left lung base. No
pneumoperitoneum.

Severe distension of the stomach with air in fluid is evident on the
abdominal images and appears new from prior studies. The bowel-gas
pattern elsewhere is nonobstructed. Other abdominal and pelvic
visceral contours are within normal limits. Stable cholecystectomy
clips. Chronic spine degeneration. Stable visualized osseous
structures.
IMPRESSION: 1. Severe air and fluid distension of the stomach which appears to
be new from prior studies. Consider Gastric Outlet obstruction. CT
Abdomen and Pelvis recommended.
2. Nonobstructed bowel-gas pattern otherwise.  No free air.
3. Chronic elevation of the right hemidiaphragm but questionable
increased right lung base density since 03/26/2017, raising the
possibility of aspiration or pneumonia in this clinical setting.
Attention to the right lung base directed on the pending CT.
4. Study discussed by telephone with Dr. JOLA SUZI POPIELARSKA on 03/31/2017
at 2257 hours.
